# Patient Record
Sex: Female | Born: 1980
Health system: Southern US, Community
[De-identification: ages and names within clinical notes are randomized; demographics above are authoritative.]

## PROBLEM LIST (undated history)

## (undated) DIAGNOSIS — N809 Endometriosis, unspecified: Secondary | ICD-10-CM

## (undated) DIAGNOSIS — F411 Generalized anxiety disorder: Secondary | ICD-10-CM

## (undated) DIAGNOSIS — G8929 Other chronic pain: Secondary | ICD-10-CM

## (undated) DIAGNOSIS — N3946 Mixed incontinence: Secondary | ICD-10-CM

## (undated) DIAGNOSIS — F329 Major depressive disorder, single episode, unspecified: Secondary | ICD-10-CM

## (undated) DIAGNOSIS — K219 Gastro-esophageal reflux disease without esophagitis: Secondary | ICD-10-CM

## (undated) DIAGNOSIS — R7303 Prediabetes: Secondary | ICD-10-CM

## (undated) DIAGNOSIS — I10 Essential (primary) hypertension: Secondary | ICD-10-CM

## (undated) DIAGNOSIS — Z8759 Personal history of other complications of pregnancy, childbirth and the puerperium: Secondary | ICD-10-CM

## (undated) DIAGNOSIS — F419 Anxiety disorder, unspecified: Secondary | ICD-10-CM

## (undated) DIAGNOSIS — B999 Unspecified infectious disease: Secondary | ICD-10-CM

## (undated) DIAGNOSIS — M1990 Unspecified osteoarthritis, unspecified site: Secondary | ICD-10-CM

## (undated) DIAGNOSIS — F1021 Alcohol dependence, in remission: Secondary | ICD-10-CM

## (undated) DIAGNOSIS — R519 Headache, unspecified: Secondary | ICD-10-CM

## (undated) DIAGNOSIS — N819 Female genital prolapse, unspecified: Secondary | ICD-10-CM

## (undated) DIAGNOSIS — Z973 Presence of spectacles and contact lenses: Secondary | ICD-10-CM

## (undated) DIAGNOSIS — F32A Depression, unspecified: Secondary | ICD-10-CM

## (undated) HISTORY — PX: TUBAL LIGATION: SHX77

## (undated) HISTORY — PX: SKIN GRAFT: SHX250

## (undated) HISTORY — DX: Unspecified osteoarthritis, unspecified site: M19.90

## (undated) HISTORY — DX: Anxiety disorder, unspecified: F41.9

## (undated) HISTORY — DX: Essential (primary) hypertension: I10

## (undated) HISTORY — DX: Major depressive disorder, single episode, unspecified: F32.9

## (undated) HISTORY — DX: Depression, unspecified: F32.A

---
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
## 2003-09-28 ENCOUNTER — Ambulatory Visit (HOSPITAL_COMMUNITY): Admission: RE | Admit: 2003-09-28 | Discharge: 2003-09-28 | Payer: Self-pay | Admitting: Family Medicine

## 2003-10-13 ENCOUNTER — Other Ambulatory Visit: Admission: RE | Admit: 2003-10-13 | Discharge: 2003-10-13 | Payer: Self-pay | Admitting: Family Medicine

## 2003-11-01 ENCOUNTER — Ambulatory Visit (HOSPITAL_COMMUNITY): Admission: RE | Admit: 2003-11-01 | Discharge: 2003-11-01 | Payer: Self-pay | Admitting: *Deleted

## 2003-11-17 ENCOUNTER — Ambulatory Visit (HOSPITAL_COMMUNITY): Admission: RE | Admit: 2003-11-17 | Discharge: 2003-11-17 | Payer: Self-pay | Admitting: Family Medicine

## 2004-01-31 ENCOUNTER — Encounter: Admission: RE | Admit: 2004-01-31 | Discharge: 2004-01-31 | Payer: Self-pay | Admitting: Family Medicine

## 2004-03-01 ENCOUNTER — Ambulatory Visit (HOSPITAL_COMMUNITY): Admission: RE | Admit: 2004-03-01 | Discharge: 2004-03-01 | Payer: Self-pay | Admitting: Family Medicine

## 2004-03-01 ENCOUNTER — Ambulatory Visit: Payer: Self-pay | Admitting: Obstetrics & Gynecology

## 2004-03-06 ENCOUNTER — Inpatient Hospital Stay (HOSPITAL_COMMUNITY): Admission: AD | Admit: 2004-03-06 | Discharge: 2004-03-13 | Payer: Self-pay | Admitting: Family Medicine

## 2004-11-13 ENCOUNTER — Other Ambulatory Visit: Admission: RE | Admit: 2004-11-13 | Discharge: 2004-11-13 | Payer: Self-pay | Admitting: Family Medicine

## 2006-06-03 DIAGNOSIS — Z9851 Tubal ligation status: Secondary | ICD-10-CM

## 2006-06-03 HISTORY — DX: Tubal ligation status: Z98.51

## 2006-06-03 HISTORY — PX: LAPAROSCOPIC TUBAL LIGATION: SUR803

## 2009-11-10 ENCOUNTER — Emergency Department (HOSPITAL_COMMUNITY): Admission: EM | Admit: 2009-11-10 | Discharge: 2009-11-10 | Payer: Self-pay | Admitting: Emergency Medicine

## 2010-10-19 NOTE — Discharge Summary (Signed)
[name], [name]              ACCOUNT NO.:  [id number]   MEDICAL RECORD NO.:  [id number]          PATIENT TYPE:  INP   LOCATION:  9101                          FACILITY:  [facility]   PHYSICIAN:  Magnus Sinning. Rice, M.D. DATE OF BIRTH:  1980-07-21   DATE OF ADMISSION:  03/06/2004  DATE OF DISCHARGE:  03/13/2004                                 DISCHARGE SUMMARY   DISCHARGE DIAGNOSES:  1.  Intrauterine pregnancy at 36 and 5, delivered.  2.  Severe preeclampsia.  3.  Vaginal delivery.  4.  Postpartum fever.   DISCHARGE MEDICATIONS:  1.  Motrin 600 mg one p.o. t.i.d. with food.  2.  Labetalol 200 mg one tablet twice a day.  3.  Lasix 20 mg one tablet per day.   DISCHARGE INSTRUCTIONS:  The patient discharged to home with follow-up at  [facility] at 1:45 p.m. in 24 hours.   STUDIES:  Postpartum hemoglobin was 10.3 with platelets of 185.  Blood  culture with no growth after 5 days.  Urine culture showed Enterobacter.   HISTORY AND PHYSICAL:  This is a 30 year old G1 at 71 and 2 by an 11-week  ultrasound who came to the office for a prenatal visit and was found to have  very elevated blood pressures, with blood pressures ranging from 172/104 to  172/111.  She also reported mild headache on the left side of her forehead  with no scotoma, right upper quadrant pain, or ankle swelling.  This  pregnancy had been complicated by gestational diabetes, with fasting blood  sugars in the 80s and 2-hour postprandials less than or equal to 120 in the  week prior to delivery.  Medications:  Prenatal vitamins.  Allergy to  PENICILLIN which causes a rash.  Social history:  No tobacco, alcohol, or  drugs.  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  Prenatal labs were O positive, antibody negative, RPR nonreactive, rubella  immune, HB surface antigen negative, HIV nonreactive, Pap within normal  limits, GC and chlamydia negative, 1-hour Glucola was 150.  She had  an  elevated Down's risk with a normal ultrasound.  Her 3-hour Glucola initial  fasting reading was not done.  The 1-hour was 217, 2-hour 196, and 3-hour  140.  Her 24-hour on September 19 showed 136 mg of protein. She has normal  PIH labs on September 19.  Ultrasound on September 29 showed the baby was at  the 90th to the 95th percentile with an AFI of 15.9 and a grade 1 placenta.  Physical exam was normal except for blood pressures of 159/103 and 170/101.  The abdomen was gravid and the baby was felt to be about 6 pounds by  Leopold's.  Extremities showed 2+ DTRs, no clonus, and no edema.  Fetal  heart tones on admission were in the 140 to 145 range with accelerations  present and occasional mild decelerations.  Tocometry showed contractions  every 5 minutes not felt by the patient.  Laboratory showed a creatinine of  0.7, a hemoglobin of 12.2, platelets of 184, uric acid was 4.1, LDH 132,  SGOT 16, and SGPT 14.  The patient was therefore admitted.  She would be  under observation, get a 24-hour urine.  She was placed on bedrest with  bathroom privileges and her gestational diabetes was in good control but  fasting blood sugars were ordered.  She was placed on a carbohydrate  modified diet, and for her headache she was given Tylenol.   On the day after admission the patient had increased blood pressures that  had started overnight.  She had a unilateral headache in the morning.  She  was therefore started on magnesium sulfate a 4 g bolus with 2 g/hour, and  labetalol was added 50 mg twice a day.  Her diabetes remained in good  control and her headache was improved after her Tylenol.  Later in the day,  the patient was found to have improved blood pressures and decreased  headache, but her blood pressure was 159/91.  Her vaginal exam was found to  be long, firm, fingertip external, and internal closed.  The patient's care  was discussed with Dr. Gavin Potters who agreed that it was appropriate  to begin  ripening her cervix, given the state of her elevated blood pressures.  The  patient was ripened using Cervidil and Cytotec.  She had a magnesium level  while on the magnesium which was found to be 5.7.  She tolerated the  magnesium fairly well.  She did get some Stadol and Phenergan for some  cramping.  The patient finally progressed into labor after successive  methods of induction on March 09, 2004.  She was delivered on March 10, 2004 vaginally with the pediatrician team at delivery, and the baby was  evaluated and sent to the newborn nursery.  Postpartum, the patient was  transferred to [facility] where she was continued on magnesium for 24 hours.  Her  blood pressures drifted down, she had good diuresis, and was close to ready  to be discharged, and had a fever to 102.6 overnight.  Her blood pressures  were also occasionally elevated so it was decided to continue having her in  the hospital 1 further day.  On March 13, 2004 her breasts were found to  be tender and full.  This was believed to be the reason for her fever.  That  improved after use of Motrin.  Her blood pressures were reasonably well  controlled on labetalol.  She was sent home on relative rest and told to  follow up at the office on the day following her discharge.  She was also  given precautions to watch for further fevers.  The additional discharge  medications and instructions are as listed above.     Kath   KMR/MEDQ  D:  03/30/2004  T:  03/30/2004  Job:  [id number]

## 2014-02-14 ENCOUNTER — Emergency Department (HOSPITAL_COMMUNITY)
Admission: EM | Admit: 2014-02-14 | Discharge: 2014-02-14 | Disposition: A | Discharge status: Home / Self Care | Payer: Self-pay | Attending: Emergency Medicine | Admitting: Emergency Medicine

## 2014-02-14 ENCOUNTER — Encounter (HOSPITAL_COMMUNITY): Payer: Self-pay | Admitting: Emergency Medicine

## 2014-02-14 DIAGNOSIS — F17200 Nicotine dependence, unspecified, uncomplicated: Secondary | ICD-10-CM | POA: Insufficient documentation

## 2014-02-14 DIAGNOSIS — M5441 Lumbago with sciatica, right side: Secondary | ICD-10-CM

## 2014-02-14 DIAGNOSIS — M545 Low back pain, unspecified: Secondary | ICD-10-CM | POA: Insufficient documentation

## 2014-02-14 DIAGNOSIS — M5430 Sciatica, unspecified side: Secondary | ICD-10-CM | POA: Insufficient documentation

## 2014-02-14 DIAGNOSIS — Z880 Allergy status to penicillin: Secondary | ICD-10-CM | POA: Insufficient documentation

## 2014-02-14 MED ORDER — CYCLOBENZAPRINE HCL 5 MG PO TABS: 5.0000 mg | ORAL_TABLET | Freq: Three times a day (TID) | ORAL | Status: DC | PRN

## 2014-02-14 MED ORDER — TRAMADOL HCL 50 MG PO TABS
50.0000 mg | ORAL_TABLET | Freq: Four times a day (QID) | ORAL | Status: DC | PRN
Start: 2014-02-14 — End: 2017-05-09

## 2014-02-14 MED ORDER — NAPROXEN 500 MG PO TABS: 500.0000 mg | ORAL_TABLET | Freq: Two times a day (BID) | ORAL | Status: DC

## 2014-02-14 NOTE — ED Notes (Signed)
Low back pain for 1.5 weeks. No injury, Increased pain with movement.  No urinary sx

## 2014-02-14 NOTE — Discharge Instructions (Signed)
Do not take the narcotic or the muscle relaxant if driving because they will make you sleepy.  Back Pain, Adult Back pain is very common. The pain often gets better over time. The cause of back pain is usually not dangerous. Most people can learn to manage their back pain on their own.  HOME CARE   Stay active. Start with short walks on flat ground if you can. Try to walk farther each day.  Do not sit, drive, or stand in one place for more than 30 minutes. Do not stay in bed.  Do not avoid exercise or work. Activity can help your back heal faster.  Be careful when you bend or lift an object. Bend at your knees, keep the object close to you, and do not twist.  Sleep on a firm mattress. Lie on your side, and bend your knees. If you lie on your back, put a pillow under your knees.  Only take medicines as told by your doctor.  Put ice on the injured area.  Put ice in a plastic bag.  Place a towel between your skin and the bag.  Leave the ice on for 15-20 minutes, 03-04 times a day for the first 2 to 3 days. After that, you can switch between ice and heat packs.  Ask your doctor about back exercises or massage.  Avoid feeling anxious or stressed. Find good ways to deal with stress, such as exercise. GET HELP RIGHT AWAY IF:   Your pain does not go away with rest or medicine.  Your pain does not go away in 1 week.  You have new problems.  You do not feel well.  The pain spreads into your legs.  You cannot control when you poop (bowel movement) or pee (urinate).  Your arms or legs feel weak or lose feeling (numbness).  You feel sick to your stomach (nauseous) or throw up (vomit).  You have belly (abdominal) pain.  You feel like you may pass out (faint). MAKE SURE YOU:   Understand these instructions.  Will watch your condition.  Will get help right away if you are not doing well or get worse. Document Released: 11/06/2007 Document Revised: 08/12/2011 Document  Reviewed: 09/21/2013 [facility] Patient Information 2015 [place], [place]. This information is not intended to replace advice given to you by your health care provider. Make sure you discuss any questions you have with your health care provider.

## 2014-02-14 NOTE — ED Provider Notes (Signed)
CSN: [id number]     Arrival date & time 02/14/14  1657 History   First MD Initiated Contact with Patient 02/14/14 1753   This chart was scribed for non-physician practitioner Kerrie Buffalo, NP, working with Hurman Horn, MD by Gwenevere Abbot, ED scribe. This patient was seen in room APFT23/APFT23 and the patient's care was started at 6:21 PM.    Chief Complaint  Patient presents with  . Back Pain    Patient is a 33 y.o. female presenting with back pain. The history is provided by the patient. No language interpreter was used.  Back Pain Location:  Lumbar spine Radiates to: Right hip. Pain severity:  Severe Onset quality:  Gradual Timing:  Constant Chronicity:  New Relieved by:  Nothing Ineffective treatments:  NSAIDs  HPI Comments:  [name] is a 33 y.o. female who presents to the Emergency Department complaining of lower back pain that radiates to the right hip, onset 1 week ago. Pt denies injury, but she is a CNA, and does a great deal of lifting and moving. Pt denies incontinence of urine or bowel. Pt denies fever, chills, numbness or urinary symptoms. Pt reports that she has taken tylenol, but she has continued to go to work. Pt has never experienced this pain before.    History reviewed. No pertinent past medical history. Past Surgical History  Procedure Laterality Date  . Tubal ligation     History reviewed. No pertinent family history. History  Substance Use Topics  . Smoking status: Current Every Day Smoker  . Smokeless tobacco: Not on file  . Alcohol Use: No   OB History   Grav Para Term Preterm Abortions TAB SAB Ect Mult Living                 Review of Systems  Musculoskeletal: Positive for back pain.  All other systems reviewed and are negative.     Allergies  Penicillins and Sulfa antibiotics  Home Medications   Prior to Admission medications   Not on File   BP 144/93  Pulse 100  Temp(Src) 98.6 F (37 C) (Oral)  Resp 18  Ht  (1.6 m)   Wt 178 lb (80.74 kg)  BMI 31.54 kg/m2  SpO2 100%  LMP 02/08/2014 Physical Exam  Nursing note and vitals reviewed. Constitutional: She is oriented to person, place, and time. She appears well-developed and well-nourished. No distress.  HENT:  Head: Normocephalic and atraumatic.  Right Ear: Tympanic membrane normal.  Left Ear: Tympanic membrane normal.  Nose: Nose normal.  Mouth/Throat: Uvula is midline, oropharynx is clear and moist and mucous membranes are normal.  Eyes: EOM are normal.  Neck: Normal range of motion. Neck supple.  Cardiovascular: Normal rate and regular rhythm.   Pulmonary/Chest: Effort normal and breath sounds normal. She has no decreased breath sounds. She has no wheezes. She has no rhonchi. She has no rales.  Abdominal: Soft. Bowel sounds are normal. There is no tenderness.  Musculoskeletal: Normal range of motion.       Lumbar back: She exhibits tenderness, pain and spasm. She exhibits normal pulse.  Tenderness over the right lumbar area that radiates to the right sciatic nerve. Ambulatory without foot drag.   Neurological: She is alert and oriented to person, place, and time. She has normal strength. No cranial nerve deficit or sensory deficit. Gait normal.  Reflex Scores:      Bicep reflexes are 2+ on the right side and 2+ on the left side.  Brachioradialis reflexes are 2+ on the right side and 2+ on the left side.      Patellar reflexes are 2+ on the right side and 2+ on the left side.      Achilles reflexes are 2+ on the right side and 2+ on the left side. Skin: Skin is warm and dry.  Psychiatric: She has a normal mood and affect. Her behavior is normal.    ED Course  Procedures  DIAGNOSTIC STUDIES: Oxygen Saturation is 100% on RA, normal by my interpretation.  COORDINATION OF CARE: 6:27 PM-Discussed treatment plan with pt at bedside and pt agreed to plan.   MDM  33 y.o. female with low back pain for the past week and a half. Stable for discharge  without neuro deficits. Will treat for pain and muscle spasm. Patient to follow up with ortho if symptoms persist. Discussed with the patient clinical findings and plan of care. All questioned fully answered.    Medication List         cyclobenzaprine 5 MG tablet  Commonly known as:  FLEXERIL  Take 1 tablet (5 mg total) by mouth 3 (three) times daily as needed for muscle spasms.     naproxen 500 MG tablet  Commonly known as:  NAPROSYN  Take 1 tablet (500 mg total) by mouth 2 (two) times daily.     traMADol 50 MG tablet  Commonly known as:  ULTRAM  Take 1 tablet (50 mg total) by mouth every 6 (six) hours as needed.       I personally performed the services described in this documentation, which was scribed in my presence. The recorded information has been reviewed and is accurate.   [facility] Orlene Och, [place] 02/15/14 [id number]

## 2014-02-16 NOTE — ED Provider Notes (Signed)
Medical screening examination/treatment/procedure(s) were performed by non-physician practitioner and as supervising physician I was immediately available for consultation/collaboration.   EKG Interpretation None       Andrick Rust M Alexandar Weisenberger, MD 02/16/14 2245 

## 2016-06-03 DIAGNOSIS — Z87898 Personal history of other specified conditions: Secondary | ICD-10-CM

## 2016-06-03 HISTORY — DX: Personal history of other specified conditions: Z87.898

## 2017-01-12 DIAGNOSIS — F17210 Nicotine dependence, cigarettes, uncomplicated: Secondary | ICD-10-CM | POA: Diagnosis not present

## 2017-01-12 DIAGNOSIS — K047 Periapical abscess without sinus: Secondary | ICD-10-CM | POA: Diagnosis not present

## 2017-01-12 DIAGNOSIS — H9201 Otalgia, right ear: Secondary | ICD-10-CM | POA: Diagnosis not present

## 2017-01-12 DIAGNOSIS — I10 Essential (primary) hypertension: Secondary | ICD-10-CM | POA: Diagnosis not present

## 2017-01-12 DIAGNOSIS — K0381 Cracked tooth: Secondary | ICD-10-CM | POA: Diagnosis not present

## 2017-01-12 DIAGNOSIS — R220 Localized swelling, mass and lump, head: Secondary | ICD-10-CM | POA: Diagnosis not present

## 2017-01-12 DIAGNOSIS — Z9114 Patient's other noncompliance with medication regimen: Secondary | ICD-10-CM | POA: Diagnosis not present

## 2017-05-07 NOTE — Progress Notes (Signed)
Subjective: ZO:XWRUEAVWUCC:establish care, new dx HTN; possible UTI HPI: Revonda StandardMarsha M [place] is a 36 y.o. female presenting to clinic today for:  1. Hypertension Patient reports she was recently seen at the [facility] emergency department in [place] after she lost consciousness at work.  She had an MRI, CT of the brain which was unremarkable per her report.  She had EKG and blood labs again which were unremarkable.  She notes that she was told that she had elevated blood pressure and was prescribed nadolol.  She notes that she has been compliant with this medication but it has been causing her to be dizzy, fatigued and poor feeling.  Prior to establishing care today, she has not seen a provider in several years.  Currently, she denies headache, dizziness, visual changes, nausea, vomiting, LE swelling, abdominal pain or shortness of breath.  She reports chronic chest pain that is left-sided.  She notes that this has been present for greater than 6 months.  It is constant and not exacerbated by any activities.  She denies associated shortness of breath, nausea, vomiting.  Her family history is significant for early cardiovascular disease in her father, who had his first heart attack in his 30s.  She notes that her twin brother had his first heart attack at age 34 and has had 2 subsequent MIs since that time.  Father's medical history is also significant for cardio vascular accident.  Her mother's history significant for CVA x2.  Hypertension is present and her twin brother, younger brother and younger sister.  She has never seen a cardiologist for this.  2. Abnormal menstrual cycle Patient reports that she has irregular menstrual cycles.  She notes last menstrual cycle was April 20, 2017.  She describes this as heavy with clots appreciated.  She has a BTL.  Not on any medications.  Patient has 2 children.  No known history of thyroid disorder personally or within the family.  Past Medical History:  Diagnosis Date  .  Anxiety   . Arthritis   . Depression   . Hypertension    Past Surgical History:  Procedure Laterality Date  . TUBAL LIGATION     Social History   Socioeconomic History  . Marital status: Divorced    Spouse name: Not on file  . Number of children: Not on file  . Years of education: Not on file  . Highest education level: Not on file  Social Needs  . Financial resource strain: Not on file  . Food insecurity - worry: Not on file  . Food insecurity - inability: Not on file  . Transportation needs - medical: Not on file  . Transportation needs - non-medical: Not on file  Occupational History  . Occupation: CMA  Tobacco Use  . Smoking status: Current Every Day Smoker    Packs/day: 1.00    Years: 5.00    Pack years: 5.00    Types: Cigarettes  . Smokeless tobacco: Never Used  Substance and Sexual Activity  . Alcohol use: No  . Drug use: No  . Sexual activity: Yes    Birth control/protection: Surgical  Other Topics Concern  . Not on file  Social History Narrative  . Not on file   No outpatient medications have been marked as taking for the 05/09/17 encounter (Office Visit) with Raliegh IpGottschalk, Ettore Trebilcock M, DO.   Family History  Problem Relation Age of Onset  . Depression Mother   . Stroke Mother   . Diabetes Father   .  Hypertension Father   . Stroke Father   . Heart attack Father 30  . Hypertension Sister   . Hypertension Brother   . Heart attack Brother 27       x3 MI (twin bro)  . CAD Brother   . Asthma Daughter   . Breast cancer Paternal Grandmother [id number]  . Hypertension Brother   . Healthy Daughter    Allergies  Allergen Reactions  . Penicillins   . Sulfa Antibiotics      Health Maintenance: pap smear  ROS: Per HPI  Objective: Office vital signs reviewed. BP (!) 146/97   Pulse 89   Temp (!) 97.5 F (36.4 C) (Oral)   Ht 5\' 3"  (1.6 m)   Wt 191 lb (86.6 kg)   BMI 33.83 kg/m   Physical Examination:  General: Awake, alert, well nourished, well appearing  female, No acute distress HEENT: Normal    Neck: No masses palpated. No lymphadenopathy, thyroid not palpable. NO JVD.  No carotid bruits    Eyes: PERRLA, extraocular movement in tact, sclera white    Throat: moist mucus membranes, no erythema Cardio: regular rate and rhythm, S1S2 heard, no murmurs appreciated Pulm: clear to auscultation bilaterally, no wheezes, rhonchi or rales; normal work of breathing on room air Ext: Warm, well-perfused, no edema, +2 distal pulses Neuro: Follows all commands, no focal neurologic deficits Psych: Mood stable, speech normal, affect appropriate, good eye contact, does not appear to be responding to internal stimuli  Depression screen PHQ 2/9 05/09/2017  Decreased Interest 0  Down, Depressed, Hopeless 0  PHQ - 2 Score 0    Assessment/ Plan: 36 y.o. female   Essential hypertension Not controlled on nadolol.  Patient to discontinue nadolol, start hydrochlorothiazide 12.5 mg daily.  She will increase to 25 mg if she has blood pressures greater than 140/90.  She will follow-up in 2 weeks for blood pressure recheck.  If persistently outside of goal, we will plan to add additional medication.  Obtain CMP, lipid, TSH, CBC today.  Given her strong family history of early cardiovascular disease, and the reports of atypical chest pain (reports normal EKG/ workup recently in ED), referral has been placed to cardiology for further evaluation.  Chest pain may be a manifestation of anxiety but I suspect that she likely at minimum warrants stress testing. Strict return precautions and reasons for emergent evaluation in the emergency department review with patient.  She voiced understanding and will follow-up as needed.   Anxiety state We will hold off on any medications at this time.  We discussed that it is important to ensure that she is cardiovascularly sound before proceeding with any medications like SSRIs.  Anxiety is well controlled at this time with meditation.  No  medications added today.  Abnormal menstrual periods Obtain CMP, TSH, CBC.  If unremarkable, we discussed possible consideration for pelvic ultrasound to rule out fibroids.  If this is unremarkable, could consider initiation of OCPs versus IUD placement.  Atypical chest pain May be secondary to anxiety symptoms.  However given her strong family history of early cardiovascular disease, she has been referred to cardiology for further evaluation.  Release of information completed to obtain recent EKG, labs, MRI and CT head.  Encounter to establish care with new doctor Release of information form completed today.  Will obtain records from [facility] in [place].  Patient to schedule follow-up for 2-week recheck of blood pressure/physical exam with Pap smear.  Need to address tobacco use.   Lynell Greenhouse  Windell Moulding, [place] [phone]

## 2017-05-09 ENCOUNTER — Ambulatory Visit: Payer: BLUE CROSS/BLUE SHIELD | Admitting: Family Medicine

## 2017-05-09 ENCOUNTER — Encounter: Payer: Self-pay | Admitting: Family Medicine

## 2017-05-09 VITALS — BP 146/97 | HR 89 | Temp 97.5°F | Ht 63.0 in | Wt 191.0 lb

## 2017-05-09 DIAGNOSIS — Z8249 Family history of ischemic heart disease and other diseases of the circulatory system: Secondary | ICD-10-CM

## 2017-05-09 DIAGNOSIS — N926 Irregular menstruation, unspecified: Secondary | ICD-10-CM

## 2017-05-09 DIAGNOSIS — Z7689 Persons encountering health services in other specified circumstances: Secondary | ICD-10-CM | POA: Diagnosis not present

## 2017-05-09 DIAGNOSIS — F17200 Nicotine dependence, unspecified, uncomplicated: Secondary | ICD-10-CM

## 2017-05-09 DIAGNOSIS — I10 Essential (primary) hypertension: Secondary | ICD-10-CM | POA: Diagnosis not present

## 2017-05-09 DIAGNOSIS — F411 Generalized anxiety disorder: Secondary | ICD-10-CM | POA: Insufficient documentation

## 2017-05-09 DIAGNOSIS — R0789 Other chest pain: Secondary | ICD-10-CM | POA: Diagnosis not present

## 2017-05-09 MED ORDER — HYDROCHLOROTHIAZIDE 25 MG PO TABS: 12.5000 mg | ORAL_TABLET | Freq: Every day | ORAL | 0 refills | Status: DC

## 2017-05-09 NOTE — Assessment & Plan Note (Signed)
We will hold off on any medications at this time.  We discussed that it is important to ensure that she is cardiovascularly sound before proceeding with any medications like SSRIs.  Anxiety is well controlled at this time with meditation.  No medications added today.

## 2017-05-09 NOTE — Assessment & Plan Note (Signed)
Not controlled on nadolol.  Patient to discontinue nadolol, start hydrochlorothiazide 12.5 mg daily.  She will increase to 25 mg if she has blood pressures greater than 140/90.  She will follow-up in 2 weeks for blood pressure recheck.  If persistently outside of goal, we will plan to add additional medication.  Obtain CMP, lipid, TSH, CBC today.  Given her strong family history of early cardiovascular disease, and the reports of atypical chest pain (reports normal EKG/ workup recently in ED), referral has been placed to cardiology for further evaluation.  Chest pain may be a manifestation of anxiety but I suspect that she likely at minimum warrants stress testing. Strict return precautions and reasons for emergent evaluation in the emergency department review with patient.  She voiced understanding and will follow-up as needed.

## 2017-05-09 NOTE — Assessment & Plan Note (Signed)
May be secondary to anxiety symptoms.  However given her strong family history of early cardiovascular disease, she has been referred to cardiology for further evaluation.  Release of information completed to obtain recent EKG, labs, MRI and CT head.

## 2017-05-09 NOTE — Patient Instructions (Signed)
I would like you to follow-up in 2 weeks for repeat blood pressures and your annual physical exam.  We will plan to obtain the Pap smear at that time.  I have referred you to cardiology.  If you do not hear from anyone to schedule an appointment in the next week, please call me.  If you develop any of the worsening symptoms or signs of heart attack, please seek immediate medical attention in the emergency department.  You had labs performed today.  You will be contacted with the results of the labs once they are available, usually in the next 3 days for routine lab work.   Hypertension Hypertension, commonly called high blood pressure, is when the force of blood pumping through the arteries is too strong. The arteries are the blood vessels that carry blood from the heart throughout the body. Hypertension forces the heart to work harder to pump blood and may cause arteries to become narrow or stiff. Having untreated or uncontrolled hypertension can cause heart attacks, strokes, kidney disease, and other problems. A blood pressure reading consists of a higher number over a lower number. Ideally, your blood pressure should be below 120/80. The first ("top") number is called the systolic pressure. It is a measure of the pressure in your arteries as your heart beats. The second ("bottom") number is called the diastolic pressure. It is a measure of the pressure in your arteries as the heart relaxes. What are the causes? The cause of this condition is not known. What increases the risk? Some risk factors for high blood pressure are under your control. Others are not. Factors you can change  Smoking.  Having type 2 diabetes mellitus, high cholesterol, or both.  Not getting enough exercise or physical activity.  Being overweight.  Having too much fat, sugar, calories, or salt (sodium) in your diet.  Drinking too much alcohol. Factors that are difficult or impossible to change  Having chronic  kidney disease.  Having a family history of high blood pressure.  Age. Risk increases with age.  Race. You may be at higher risk if you are African-American.  Gender. Men are at higher risk than women before age 34. After age 34, women are at higher risk than men.  Having obstructive sleep apnea.  Stress. What are the signs or symptoms? Extremely high blood pressure (hypertensive crisis) may cause:  Headache.  Anxiety.  Shortness of breath.  Nosebleed.  Nausea and vomiting.  Severe chest pain.  Jerky movements you cannot control (seizures).  How is this diagnosed? This condition is diagnosed by measuring your blood pressure while you are seated, with your arm resting on a surface. The cuff of the blood pressure monitor will be placed directly against the skin of your upper arm at the level of your heart. It should be measured at least twice using the same arm. Certain conditions can cause a difference in blood pressure between your right and left arms. Certain factors can cause blood pressure readings to be lower or higher than normal (elevated) for a short period of time:  When your blood pressure is higher when you are in a health care provider's office than when you are at home, this is called white coat hypertension. Most people with this condition do not need medicines.  When your blood pressure is higher at home than when you are in a health care provider's office, this is called masked hypertension. Most people with this condition may need medicines to control blood  pressure.  If you have a high blood pressure reading during one visit or you have normal blood pressure with other risk factors:  You may be asked to return on a different day to have your blood pressure checked again.  You may be asked to monitor your blood pressure at home for 1 week or longer.  If you are diagnosed with hypertension, you may have other blood or imaging tests to help your health care  provider understand your overall risk for other conditions. How is this treated? This condition is treated by making healthy lifestyle changes, such as eating healthy foods, exercising more, and reducing your alcohol intake. Your health care provider may prescribe medicine if lifestyle changes are not enough to get your blood pressure under control, and if:  Your systolic blood pressure is above 130.  Your diastolic blood pressure is above 80.  Your personal target blood pressure may vary depending on your medical conditions, your age, and other factors. Follow these instructions at home: Eating and drinking  Eat a diet that is high in fiber and potassium, and low in sodium, added sugar, and fat. An example eating plan is called the DASH (Dietary Approaches to Stop Hypertension) diet. To eat this way: ? Eat plenty of fresh fruits and vegetables. Try to fill half of your plate at each meal with fruits and vegetables. ? Eat whole grains, such as whole wheat pasta, brown rice, or whole grain bread. Fill about one quarter of your plate with whole grains. ? Eat or drink low-fat dairy products, such as skim milk or low-fat yogurt. ? Avoid fatty cuts of meat, processed or cured meats, and poultry with skin. Fill about one quarter of your plate with lean proteins, such as fish, chicken without skin, beans, eggs, and tofu. ? Avoid premade and processed foods. These tend to be higher in sodium, added sugar, and fat.  Reduce your daily sodium intake. Most people with hypertension should eat less than 1,500 mg of sodium a day.  Limit alcohol intake to no more than 1 drink a day for nonpregnant women and 2 drinks a day for men. One drink equals 12 oz of beer, 5 oz of wine, or 1 oz of hard liquor. Lifestyle  Work with your health care provider to maintain a healthy body weight or to lose weight. Ask what an ideal weight is for you.  Get at least 30 minutes of exercise that causes your heart to beat  faster (aerobic exercise) most days of the week. Activities may include walking, swimming, or biking.  Include exercise to strengthen your muscles (resistance exercise), such as pilates or lifting weights, as part of your weekly exercise routine. Try to do these types of exercises for 30 minutes at least 3 days a week.  Do not use any products that contain nicotine or tobacco, such as cigarettes and e-cigarettes. If you need help quitting, ask your health care provider.  Monitor your blood pressure at home as told by your health care provider.  Keep all follow-up visits as told by your health care provider. This is important. Medicines  Take over-the-counter and prescription medicines only as told by your health care provider. Follow directions carefully. Blood pressure medicines must be taken as prescribed.  Do not skip doses of blood pressure medicine. Doing this puts you at risk for problems and can make the medicine less effective.  Ask your health care provider about side effects or reactions to medicines that you should watch  for. Contact a health care provider if:  You think you are having a reaction to a medicine you are taking.  You have headaches that keep coming back (recurring).  You feel dizzy.  You have swelling in your ankles.  You have trouble with your vision. Get help right away if:  You develop a severe headache or confusion.  You have unusual weakness or numbness.  You feel faint.  You have severe pain in your chest or abdomen.  You vomit repeatedly.  You have trouble breathing. Summary  Hypertension is when the force of blood pumping through your arteries is too strong. If this condition is not controlled, it may put you at risk for serious complications.  Your personal target blood pressure may vary depending on your medical conditions, your age, and other factors. For most people, a normal blood pressure is less than 120/80.  Hypertension is  treated with lifestyle changes, medicines, or a combination of both. Lifestyle changes include weight loss, eating a healthy, low-sodium diet, exercising more, and limiting alcohol. This information is not intended to replace advice given to you by your health care provider. Make sure you discuss any questions you have with your health care provider. Document Released: 05/20/2005 Document Revised: 04/17/2016 Document Reviewed: 04/17/2016 Elsevier Interactive Patient Education  2018 [employer]  Nonspecific Chest Pain Chest pain can be caused by many different conditions. There is a chance that your pain could be related to something serious, such as a heart attack or a blood clot in your lungs. Chest pain can also be caused by conditions that are not life-threatening. If you have chest pain, it is very important to follow up with your doctor. Follow these instructions at home: Medicines  If you were prescribed an antibiotic medicine, take it as told by your doctor. Do not stop taking the antibiotic even if you start to feel better.  Take over-the-counter and prescription medicines only as told by your doctor. Lifestyle  Do not use any products that contain nicotine or tobacco, such as cigarettes and e-cigarettes. If you need help quitting, ask your doctor.  Do not drink alcohol.  Make lifestyle changes as told by your doctor. These may include: ? Getting regular exercise. Ask your doctor for some activities that are safe for you. ? Eating a heart-healthy diet. A diet specialist (dietitian) can help you to learn healthy eating options. ? Staying at a healthy weight. ? Managing diabetes, if needed. ? Lowering your stress, as with deep breathing or spending time in nature. General instructions  Avoid any activities that make you feel chest pain.  If your chest pain is because of heartburn: ? Raise (elevate) the head of your bed about 6 inches (15 cm). You can do this by putting blocks  under the bed legs at the head of the bed. ? Do not sleep with extra pillows under your head. That does not help heartburn.  Keep all follow-up visits as told by your doctor. This is important. This includes any further testing if your chest pain does not go away. Contact a doctor if:  Your chest pain does not go away.  You have a rash with blisters on your chest.  You have a fever.  You have chills. Get help right away if:  Your chest pain is worse.  You have a cough that gets worse, or you cough up blood.  You have very bad (severe) pain in your belly (abdomen).  You are very weak.  You pass out (faint).  You have either of these for no clear reason: ? Sudden chest discomfort. ? Sudden discomfort in your arms, back, neck, or jaw.  You have shortness of breath at any time.  You suddenly start to sweat, or your skin gets clammy.  You feel sick to your stomach (nauseous).  You throw up (vomit).  You suddenly feel light-headed or dizzy.  Your heart starts to beat fast, or it feels like it is skipping beats. These symptoms may be an emergency. Do not wait to see if the symptoms will go away. Get medical help right away. Call your local emergency services (911 in the [place] Do not drive yourself to the hospital. This information is not intended to replace advice given to you by your health care provider. Make sure you discuss any questions you have with your health care provider. Document Released: 11/06/2007 Document Revised: 02/12/2016 Document Reviewed: 02/12/2016 Elsevier Interactive Patient Education  2017 [employer].

## 2017-05-09 NOTE — Assessment & Plan Note (Signed)
Obtain CMP, TSH, CBC.  If unremarkable, we discussed possible consideration for pelvic ultrasound to rule out fibroids.  If this is unremarkable, could consider initiation of OCPs versus IUD placement.

## 2017-05-10 LAB — CMP14+EGFR
ALT: 12 IU/L (ref 0–32)
AST: 13 IU/L (ref 0–40)
Albumin/Globulin Ratio: 2 (ref 1.2–2.2)
Albumin: 4.6 g/dL (ref 3.5–5.5)
Alkaline Phosphatase: 72 IU/L (ref 39–117)
BUN/Creatinine Ratio: 13 (ref 9–23)
BUN: 11 mg/dL (ref 6–20)
Bilirubin Total: 0.4 mg/dL (ref 0.0–1.2)
CO2: 22 mmol/L (ref 20–29)
Calcium: 9.6 mg/dL (ref 8.7–10.2)
Chloride: 101 mmol/L (ref 96–106)
Creatinine, Ser: 0.86 mg/dL (ref 0.57–1.00)
GFR calc Af Amer: 103 mL/min/{1.73_m2} (ref >59)
GFR calc non Af Amer: 89 mL/min/{1.73_m2} (ref >59)
Globulin, Total: 2.3 g/dL (ref 1.5–4.5)
Glucose: 90 mg/dL (ref 65–99)
Potassium: 4.2 mmol/L (ref 3.5–5.2)
Sodium: 139 mmol/L (ref 134–144)
Total Protein: 6.9 g/dL (ref 6.0–8.5)

## 2017-05-10 LAB — CBC WITH DIFFERENTIAL/PLATELET
Basophils Absolute: 0 10*3/uL (ref 0.0–0.2)
Basos: 0 %
EOS (ABSOLUTE): 0.6 10*3/uL — ABNORMAL HIGH (ref 0.0–0.4)
Eos: 4 %
Hematocrit: 44.6 % (ref 34.0–46.6)
Hemoglobin: 15 g/dL (ref 11.1–15.9)
Immature Grans (Abs): 0 10*3/uL (ref 0.0–0.1)
Immature Granulocytes: 0 %
Lymphocytes Absolute: 2.7 10*3/uL (ref 0.7–3.1)
Lymphs: 20 %
MCH: 30.5 pg (ref 26.6–33.0)
MCHC: 33.6 g/dL (ref 31.5–35.7)
MCV: 91 fL (ref 79–97)
Monocytes Absolute: 0.6 10*3/uL (ref 0.1–0.9)
Monocytes: 5 %
Neutrophils Absolute: 9.6 10*3/uL — ABNORMAL HIGH (ref 1.4–7.0)
Neutrophils: 71 %
Platelets: 247 10*3/uL (ref 150–379)
RBC: 4.92 x10E6/uL (ref 3.77–5.28)
RDW: 13 % (ref 12.3–15.4)
WBC: 13.6 10*3/uL — ABNORMAL HIGH (ref 3.4–10.8)

## 2017-05-10 LAB — TSH: TSH: 1.03 u[IU]/mL (ref 0.450–4.500)

## 2017-05-10 LAB — LIPID PANEL
Chol/HDL Ratio: 4.8 ratio — ABNORMAL HIGH (ref 0.0–4.4)
Cholesterol, Total: 183 mg/dL (ref 100–199)
HDL: 38 mg/dL — ABNORMAL LOW (ref >39)
LDL Calculated: 128 mg/dL — ABNORMAL HIGH (ref 0–99)
Triglycerides: 84 mg/dL (ref 0–149)
VLDL Cholesterol Cal: 17 mg/dL (ref 5–40)

## 2017-05-14 ENCOUNTER — Other Ambulatory Visit: Payer: Self-pay | Admitting: Family Medicine

## 2017-05-14 ENCOUNTER — Telehealth: Payer: Self-pay | Admitting: Family Medicine

## 2017-05-14 MED ORDER — ATORVASTATIN CALCIUM 40 MG PO TABS
40.0000 mg | ORAL_TABLET | Freq: Every day | ORAL | 0 refills | Status: DC
Start: 2017-05-14 — End: 2018-10-28

## 2017-05-14 NOTE — Progress Notes (Signed)
Recent lipid panel with elevated LDL and low HDL.  Family history significant for early cardiovascular disease.  She has been referred and has an appointment with cardiology scheduled for 05/28/2017.  LFTs within normal limits, she has BTL for contraception.  Given her significant family history and abnormal lipid panel, Lipitor 40 mg daily was prescribed.  Meds ordered this encounter  Medications  . atorvastatin (LIPITOR) 40 MG tablet    Sig: Take 1 tablet (40 mg total) by mouth daily.    Dispense:  90 tablet    Refill:  0

## 2017-05-14 NOTE — Telephone Encounter (Signed)
Patient aware of lab results and verbalizes understanding.  

## 2017-05-21 ENCOUNTER — Telehealth: Payer: Self-pay | Admitting: Family Medicine

## 2017-05-21 ENCOUNTER — Encounter: Payer: Self-pay | Admitting: Family Medicine

## 2017-05-21 ENCOUNTER — Other Ambulatory Visit: Payer: Self-pay | Admitting: Family Medicine

## 2017-05-21 MED ORDER — AMLODIPINE BESYLATE 5 MG PO TABS: 5.0000 mg | ORAL_TABLET | Freq: Every day | ORAL | 0 refills | Status: DC

## 2017-05-21 NOTE — Telephone Encounter (Signed)
Pt aware to stop hydrochlorathiazide New medication [facility]

## 2017-05-21 NOTE — Telephone Encounter (Signed)
She may discontinue if not tolerating.  Norvasc 5mg  daily sent to pharmacy.

## 2017-05-26 ENCOUNTER — Encounter: Payer: Self-pay | Admitting: *Deleted

## 2017-05-28 ENCOUNTER — Encounter: Payer: Self-pay | Admitting: *Deleted

## 2017-05-28 ENCOUNTER — Encounter: Payer: Self-pay | Admitting: Cardiovascular Disease

## 2017-05-28 ENCOUNTER — Encounter: Payer: BLUE CROSS/BLUE SHIELD | Admitting: Family Medicine

## 2017-05-28 ENCOUNTER — Telehealth: Payer: Self-pay | Admitting: Cardiovascular Disease

## 2017-05-28 ENCOUNTER — Ambulatory Visit (INDEPENDENT_AMBULATORY_CARE_PROVIDER_SITE_OTHER): Payer: Self-pay | Admitting: Cardiovascular Disease

## 2017-05-28 ENCOUNTER — Ambulatory Visit: Payer: BLUE CROSS/BLUE SHIELD | Admitting: Family Medicine

## 2017-05-28 VITALS — BP 130/89 | HR 88 | Ht 63.0 in | Wt 196.8 lb

## 2017-05-28 DIAGNOSIS — Z720 Tobacco use: Secondary | ICD-10-CM

## 2017-05-28 DIAGNOSIS — I10 Essential (primary) hypertension: Secondary | ICD-10-CM

## 2017-05-28 DIAGNOSIS — R079 Chest pain, unspecified: Secondary | ICD-10-CM

## 2017-05-28 DIAGNOSIS — R55 Syncope and collapse: Secondary | ICD-10-CM

## 2017-05-28 NOTE — Progress Notes (Signed)
CARDIOLOGY CONSULT NOTE  Patient ID: Caitlin StandardMarsha M Benn MRN: [id number] DOB/AGE: 1981-04-13 36 y.o.  Admit date: (Not on file) Primary Physician: Raliegh IpGottschalk, Ashly M, DO Referring Physician: Raliegh IpGottschalk, Ashly M, DO  Reason for Consultation: Chest pain  HPI: [name] is a 36 y.o. female who is being seen today for the evaluation of chest pain at the request of Raliegh IpGottschalk, Ashly M, DO.   I reviewed the electronic medical record with regards to PCP documentation as well as labs and studies.  She was reportedly evaluated for chest pain and an emergency room.  She has a history of hypertension and anxiety.  She reportedly has a family history of premature coronary artery disease.  Labs 05/09/17: Elevated white blood cell count of 13.6, hemoglobin 15, platelets 247, total cholesterol 183, triglycerides 84, HDL 38, LDL 128, TSH 1, BUN 11, creatinine 0.86.  ECG performed today which I personally interpreted demonstrated sinus rhythm with nonspecific T wave abnormalities.  She said she has been experiencing chest pain for approximately 1 year.  She was evaluated at an emergency room in April [place] in [place].  She said she was at work and passed out.  She said blood pressures were in the 190s/164 range.  She said she underwent an EKG, MRI, CT scan, and blood tests.  I do not have a copy of these results and plan to request them.  She was prescribed nadolol but developed fatigue.  He was then started by her PCP on hydrochlorothiazide but she developed headaches.  She is now tolerating amlodipine 5 mg.  She has increased energy and decreased chest pain and shortness of breath.  She denies leg swelling, palpitations, and paroxysmal nocturnal dyspnea.  Pains have been in the precordial region which she describes as sharp.  There is no radiation to the jaw, back, or neck.  Family history: Father has had 2 MIs and CABG.  His first MI was in his 640s.  She has a twin brother who had 3 heart attacks  in 1 day.  He is a non-smoker.  He drinks 2-3 energy drinks daily.  Social history: She has smoked 1-1/2 packs of cigarettes daily for the past 15 years.   Allergies  Allergen Reactions  . Penicillins   . Sulfa Antibiotics     Current Outpatient Medications  Medication Sig Dispense Refill  . amLODipine (NORVASC) 5 MG tablet Take 1 tablet (5 mg total) by mouth daily. 30 tablet 0  . atorvastatin (LIPITOR) 40 MG tablet Take 1 tablet (40 mg total) by mouth daily. 90 tablet 0   No current facility-administered medications for this visit.     Past Medical History:  Diagnosis Date  . Anxiety   . Arthritis   . Depression   . Hypertension     Past Surgical History:  Procedure Laterality Date  . TUBAL LIGATION      Social History   Socioeconomic History  . Marital status: Divorced    Spouse name: Not on file  . Number of children: Not on file  . Years of education: Not on file  . Highest education level: Not on file  Social Needs  . Financial resource strain: Not on file  . Food insecurity - worry: Not on file  . Food insecurity - inability: Not on file  . Transportation needs - medical: Not on file  . Transportation needs - non-medical: Not on file  Occupational History  . Occupation: CMA  Tobacco Use  .  Smoking status: Current Every Day Smoker    Packs/day: 1.00    Years: 5.00    Pack years: 5.00    Types: Cigarettes    Start date: 05/29/2006  . Smokeless tobacco: Never Used  Substance and Sexual Activity  . Alcohol use: No  . Drug use: No  . Sexual activity: Yes    Birth control/protection: Surgical    Comment: BTL  Other Topics Concern  . Not on file  Social History Narrative  . Not on file      Current Meds  Medication Sig  . amLODipine (NORVASC) 5 MG tablet Take 1 tablet (5 mg total) by mouth daily.  Marland Kitchen. atorvastatin (LIPITOR) 40 MG tablet Take 1 tablet (40 mg total) by mouth daily.      Review of systems complete and found to be negative unless  listed above in HPI    Physical exam Blood pressure 130/89, pulse 88, height 5\' 3"  (1.6 m), weight 196 lb 12.8 oz (89.3 kg), SpO2 99 %. General: NAD Neck: No JVD, no thyromegaly or thyroid nodule.  Lungs: Clear to auscultation bilaterally with normal respiratory effort. CV: Nondisplaced PMI. Regular rate and rhythm, normal S1/S2, no S3/S4, no murmur.  No peripheral edema.  No carotid bruit.    Abdomen: Soft, nontender, no distention.  Skin: Intact without lesions or rashes.  Neurologic: Alert and oriented x 3.  Psych: Normal affect. Extremities: No clubbing or cyanosis.  HEENT: Normal.   ECG: Most recent ECG reviewed.   Labs: Lab Results  Component Value Date/Time   K 4.2 05/09/2017 02:42 PM   BUN 11 05/09/2017 02:42 PM   CREATININE 0.86 05/09/2017 02:42 PM   ALT 12 05/09/2017 02:42 PM   TSH 1.030 05/09/2017 02:42 PM   HGB 15.0 05/09/2017 02:42 PM     Lipids: Lab Results  Component Value Date/Time   LDLCALC 128 (H) 05/09/2017 02:42 PM   CHOL 183 05/09/2017 02:42 PM   TRIG 84 05/09/2017 02:42 PM   HDL 38 (L) 05/09/2017 02:42 PM        ASSESSMENT AND PLAN:  1.  Chest pain: Atypical chest pains which are decreasing in intensity with blood pressure control on amlodipine 5 mg.  Cardiovascular risk factors include family history of premature coronary artery disease, hypertension, and tobacco use.  I will obtain an echocardiogram to evaluate cardiac structure and function.  I will obtain an exercise treadmill stress test to evaluate for ischemia.  2.  Hypertension: Diastolic blood pressure is elevated.  She needs risk factor modification in the form of exercise, weight loss, and tobacco cessation.  She is on amlodipine 5 mg.  No changes today.  3.  Syncope: This was deemed secondary to hypertensive emergency.  I will try to obtain records from her ED visit in April 2018.  She has had no recurrences.  4.  Tobacco abuse disorder.     Disposition: Follow up in 6  weeks.   Signed: Prentice DockerSuresh Koneswaran, M.D., F.A.C.C.  05/28/2017, 8:52 AM

## 2017-05-28 NOTE — Patient Instructions (Signed)
Medication Instructions:  Continue all current medications.  Labwork: none  Testing/Procedures:  Your physician has requested that you have an echocardiogram. Echocardiography is a painless test that uses sound waves to create images of your heart. It provides your doctor with information about the size and shape of your heart and how well your heart's chambers and valves are working. This procedure takes approximately one hour. There are no restrictions for this procedure.  Your physician has requested that you have an exercise tolerance test. For further information please visit [email or web address]. Please also follow instruction sheet, as given.  Office will contact with results via phone or letter.    Follow-Up: 6 weeks   Any Other Special Instructions Will Be Listed Below (If Applicable).  If you need a refill on your cardiac medications before your next appointment, please call your pharmacy.

## 2017-05-28 NOTE — Progress Notes (Deleted)
[name] is a 36 y.o. female presents to office today for annual physical exam examination.    Concerns today include: 1. ***  Occupation: ***, Marital status: ***, Substance use: *** Diet: ***, Exercise: *** Last eye exam: *** Last dental exam: *** Last colonoscopy: *** Last mammogram: *** Last pap smear: *** Refills needed today: *** Immunizations needed: Flu Vaccine: {YES/NO/WILD OZHYQ:65784}CARDS:18581}  Tdap Vaccine: {YES/NO/WILD ONGEX:52841}CARDS:18581}  - every 218yrs - (<3 lifetime doses or unknown): all wounds -- look up need for Tetanus IG - (>=3 lifetime doses): clean/minor wound if >10618yrs from previous; all other wounds if >429yrs from previous Zoster Vaccine: {YES/NO/WILD CARDS:18581} (those >50yo, once) Pneumonia Vaccine: {YES/NO/WILD LKGMW:10272}CARDS:18581} (those w/ risk factors) - (<1436yr) Both: Immunocompromised, cochlear implant, CSF leak, asplenic, sickle cell, Chronic Renal Failure - (<4036yr) PPSV-23 only: Heart dz, lung disease, DM, tobacco abuse, alcoholism, cirrhosis/liver disease. - (>8636yr): PPSV13 then PPSV23 in 6-12mths;  - (>8536yr): repeat PPSV23 once if pt received prior to 34yo and 539yrs have passed  Past Medical History:  Diagnosis Date  . Anxiety   . Arthritis   . Depression   . Hypertension    Social History   Socioeconomic History  . Marital status: Divorced    Spouse name: Not on file  . Number of children: Not on file  . Years of education: Not on file  . Highest education level: Not on file  Social Needs  . Financial resource strain: Not on file  . Food insecurity - worry: Not on file  . Food insecurity - inability: Not on file  . Transportation needs - medical: Not on file  . Transportation needs - non-medical: Not on file  Occupational History  . Occupation: CMA  Tobacco Use  . Smoking status: Current Every Day Smoker    Packs/day: 1.00    Years: 5.00    Pack years: 5.00    Types: Cigarettes    Start date: 05/29/2006  . Smokeless tobacco: Never Used    Substance and Sexual Activity  . Alcohol use: No  . Drug use: No  . Sexual activity: Yes    Birth control/protection: Surgical    Comment: BTL  Other Topics Concern  . Not on file  Social History Narrative  . Not on file   Past Surgical History:  Procedure Laterality Date  . TUBAL LIGATION     Family History  Problem Relation Age of Onset  . Depression Mother   . Stroke Mother   . Diabetes Father   . Hypertension Father   . Stroke Father   . Heart attack Father 30  . Hypertension Sister   . Hypertension Brother   . Heart attack Brother 27       x3 MI (twin bro)  . CAD Brother   . Asthma Daughter   . Breast cancer Paternal Grandmother [id number]  . Hypertension Brother   . Healthy Daughter     Current Outpatient Medications:  .  amLODipine (NORVASC) 5 MG tablet, Take 1 tablet (5 mg total) by mouth daily., Disp: 30 tablet, Rfl: 0 .  atorvastatin (LIPITOR) 40 MG tablet, Take 1 tablet (40 mg total) by mouth daily., Disp: 90 tablet, Rfl: 0   ROS: Review of Systems {ros; complete:30496}    Physical exam {Exam, [phone]}    Assessment/ Plan: Caitlin StandardMarsha M Fields here for annual physical exam.   No problem-specific Assessment & Plan notes found for this encounter.   Counseled on healthy lifestyle choices, including diet (rich in  fruits, vegetables and lean meats and low in salt and simple carbohydrates) and exercise (at least 30 minutes of moderate physical activity daily).  Patient to follow up in 1 year for annual exam or sooner if needed.  Elleen Coulibaly M. Nadine Counts, DO

## 2017-05-28 NOTE — Telephone Encounter (Signed)
Pre-cert Verification for the following procedure    E[facility] scheduled [facility]29-17 at [facility]/2017 [facility].

## 2017-05-29 ENCOUNTER — Other Ambulatory Visit: Payer: Self-pay | Admitting: Cardiovascular Disease

## 2017-05-29 ENCOUNTER — Other Ambulatory Visit: Payer: Self-pay

## 2017-05-29 ENCOUNTER — Ambulatory Visit (INDEPENDENT_AMBULATORY_CARE_PROVIDER_SITE_OTHER): Payer: BLUE CROSS/BLUE SHIELD

## 2017-05-29 DIAGNOSIS — R079 Chest pain, unspecified: Secondary | ICD-10-CM

## 2017-06-02 ENCOUNTER — Telehealth: Payer: Self-pay | Admitting: *Deleted

## 2017-06-02 ENCOUNTER — Encounter: Payer: Self-pay | Admitting: *Deleted

## 2017-06-02 NOTE — Telephone Encounter (Signed)
Notes recorded by Lesle ChrisHill, Diante Barley G, LPN on 29/52/841312/31/2018 at 2:02 PM EST Patient notified. Copy to pmd. Follow up scheduled for 07/04/2017.   ------  Notes recorded by Laqueta LindenKoneswaran, Suresh A, MD on 05/29/2017 at 4:43 PM EST Normal.

## 2017-06-04 ENCOUNTER — Ambulatory Visit (HOSPITAL_COMMUNITY): Payer: BLUE CROSS/BLUE SHIELD | Attending: Cardiovascular Disease

## 2017-06-16 ENCOUNTER — Other Ambulatory Visit: Payer: Self-pay | Admitting: Family Medicine

## 2017-07-04 ENCOUNTER — Ambulatory Visit: Payer: Self-pay | Admitting: Cardiovascular Disease

## 2017-07-08 ENCOUNTER — Encounter: Payer: Self-pay | Admitting: Cardiovascular Disease

## 2018-03-18 ENCOUNTER — Ambulatory Visit (INDEPENDENT_AMBULATORY_CARE_PROVIDER_SITE_OTHER): Payer: Self-pay | Admitting: Pediatrics

## 2018-03-18 ENCOUNTER — Encounter: Payer: Self-pay | Admitting: Pediatrics

## 2018-03-18 VITALS — BP 157/102 | HR 111 | Temp 98.4°F | Ht 63.0 in | Wt 175.8 lb

## 2018-03-18 DIAGNOSIS — I10 Essential (primary) hypertension: Secondary | ICD-10-CM

## 2018-03-18 DIAGNOSIS — F419 Anxiety disorder, unspecified: Secondary | ICD-10-CM

## 2018-03-18 MED ORDER — ESCITALOPRAM OXALATE 10 MG PO TABS: 10.0000 mg | ORAL_TABLET | Freq: Every day | ORAL | 3 refills | Status: DC

## 2018-03-18 MED ORDER — AMLODIPINE BESYLATE 5 MG PO TABS: 5.0000 mg | ORAL_TABLET | Freq: Every day | ORAL | 3 refills | Status: DC

## 2018-03-18 NOTE — Patient Instructions (Addendum)
Call us in 2 weeks with blood pressures. Start amlodipine 5mg  daily in the morning. Let me know if blood pressures are regularly > 135 on top or >85 on bottom   Take half tab lexapro for 8 days then full tab.  DASH Eating Plan DASH stands for "Dietary Approaches to Stop Hypertension." The DASH eating plan is a healthy eating plan that has been shown to reduce high blood pressure (hypertension). It may also reduce your risk for type 2 diabetes, heart disease, and stroke. The DASH eating plan may also help with weight loss. What are tips for following this plan? General guidelines  Avoid eating more than 2,300 mg (milligrams) of salt (sodium) a day. If you have hypertension, you may need to reduce your sodium intake to 1,500 mg a day.  Limit alcohol intake to no more than 1 drink a day for nonpregnant women and 2 drinks a day for men. One drink equals 12 oz of beer, 5 oz of wine, or 1 oz of hard liquor.  Work with your health care provider to maintain a healthy body weight or to lose weight. Ask what an ideal weight is for you.  Get at least 30 minutes of exercise that causes your heart to beat faster (aerobic exercise) most days of the week. Activities may include walking, swimming, or biking.  Work with your health care provider or diet and nutrition specialist (dietitian) to adjust your eating plan to your individual calorie needs. Reading food labels  Check food labels for the amount of sodium per serving. Choose foods with less than 5 percent of the Daily Value of sodium. Generally, foods with less than 300 mg of sodium per serving fit into this eating plan.  To find whole grains, look for the word "whole" as the first word in the ingredient list. Shopping  Buy products labeled as "low-sodium" or "no salt added."  Buy fresh foods. Avoid canned foods and premade or frozen meals. Cooking  Avoid adding salt when cooking. Use salt-free seasonings or herbs instead of table salt or sea  salt. Check with your health care provider or pharmacist before using salt substitutes.  Do not fry foods. Cook foods using healthy methods such as baking, boiling, grilling, and broiling instead.  Cook with heart-healthy oils, such as olive, canola, soybean, or sunflower oil. Meal planning   Eat a balanced diet that includes: ? 5 or more servings of fruits and vegetables each day. At each meal, try to fill half of your plate with fruits and vegetables. ? Up to 6-8 servings of whole grains each day. ? Less than 6 oz of lean meat, poultry, or fish each day. A 3-oz serving of meat is about the same size as a deck of cards. One egg equals 1 oz. ? 2 servings of low-fat dairy each day. ? A serving of nuts, seeds, or beans 5 times each week. ? Heart-healthy fats. Healthy fats called Omega-3 fatty acids are found in foods such as flaxseeds and coldwater fish, like sardines, salmon, and mackerel.  Limit how much you eat of the following: ? Canned or prepackaged foods. ? Food that is high in trans fat, such as fried foods. ? Food that is high in saturated fat, such as fatty meat. ? Sweets, desserts, sugary drinks, and other foods with added sugar. ? Full-fat dairy products.  Do not salt foods before eating.  Try to eat at least 2 vegetarian meals each week.  Eat more home-cooked food and less  restaurant, buffet, and fast food.  When eating at a restaurant, ask that your food be prepared with less salt or no salt, if possible. What foods are recommended? The items listed may not be a complete list. Talk with your dietitian about what dietary choices are best for you. Grains Whole-grain or whole-wheat bread. Whole-grain or whole-wheat pasta. Brown rice. [place]. Bulgur. Whole-grain and low-sodium cereals. Pita bread. Low-fat, low-sodium crackers. Whole-wheat flour tortillas. Vegetables Fresh or frozen vegetables (raw, steamed, roasted, or grilled). Low-sodium or reduced-sodium tomato  and vegetable juice. Low-sodium or reduced-sodium tomato sauce and tomato paste. Low-sodium or reduced-sodium canned vegetables. Fruits All fresh, dried, or frozen fruit. Canned fruit in natural juice (without added sugar). Meat and other protein foods Skinless chicken or [place]. Ground chicken or [place]. Pork with fat trimmed off. Fish and seafood. Egg whites. Dried beans, peas, or lentils. Unsalted nuts, nut butters, and seeds. Unsalted canned beans. Lean cuts of beef with fat trimmed off. Low-sodium, lean deli meat. Dairy Low-fat (1%) or fat-free (skim) milk. Fat-free, low-fat, or reduced-fat cheeses. Nonfat, low-sodium ricotta or cottage cheese. Low-fat or nonfat yogurt. Low-fat, low-sodium cheese. Fats and oils Soft margarine without trans fats. Vegetable oil. Low-fat, reduced-fat, or light mayonnaise and salad dressings (reduced-sodium). Canola, safflower, olive, soybean, and sunflower oils. Avocado. Seasoning and other foods Herbs. Spices. Seasoning mixes without salt. Unsalted popcorn and pretzels. Fat-free sweets. What foods are not recommended? The items listed may not be a complete list. Talk with your dietitian about what dietary choices are best for you. Grains Baked goods made with fat, such as croissants, muffins, or some breads. Dry pasta or rice meal packs. Vegetables Creamed or fried vegetables. Vegetables in a cheese sauce. Regular canned vegetables (not low-sodium or reduced-sodium). Regular canned tomato sauce and paste (not low-sodium or reduced-sodium). Regular tomato and vegetable juice (not low-sodium or reduced-sodium). Angie Fava. Olives. Fruits Canned fruit in a light or heavy syrup. Fried fruit. Fruit in cream or butter sauce. Meat and other protein foods Fatty cuts of meat. Ribs. Fried meat. Berniece Salines. Sausage. Bologna and other processed lunch meats. Salami. Fatback. Hotdogs. Bratwurst. Salted nuts and seeds. Canned beans with added salt. Canned or smoked fish. Whole eggs  or egg yolks. Chicken or [place] with skin. Dairy Whole or 2% milk, cream, and half-and-half. Whole or full-fat cream cheese. Whole-fat or sweetened yogurt. Full-fat cheese. Nondairy creamers. Whipped toppings. Processed cheese and cheese spreads. Fats and oils Butter. Stick margarine. Lard. Shortening. Ghee. Bacon fat. Tropical oils, such as coconut, palm kernel, or palm oil. Seasoning and other foods Salted popcorn and pretzels. Onion salt, garlic salt, seasoned salt, table salt, and sea salt. Worcestershire sauce. Tartar sauce. Barbecue sauce. Teriyaki sauce. Soy sauce, including reduced-sodium. Steak sauce. Canned and packaged gravies. Fish sauce. Oyster sauce. Cocktail sauce. Horseradish that you find on the shelf. Ketchup. Mustard. Meat flavorings and tenderizers. Bouillon cubes. Hot sauce and Tabasco sauce. Premade or packaged marinades. Premade or packaged taco seasonings. Relishes. Regular salad dressings. Where to find more information:  National Heart, Lung, and [place]: https://wilson-eaton.com/  American Heart Association: www.heart.org Summary  The DASH eating plan is a healthy eating plan that has been shown to reduce high blood pressure (hypertension). It may also reduce your risk for type 2 diabetes, heart disease, and stroke.  With the DASH eating plan, you should limit salt (sodium) intake to 2,300 mg a day. If you have hypertension, you may need to reduce your sodium intake to 1,500 mg a day.  When on the DASH eating plan, aim to eat more fresh fruits and vegetables, whole grains, lean proteins, low-fat dairy, and heart-healthy fats.  Work with your health care provider or diet and nutrition specialist (dietitian) to adjust your eating plan to your individual calorie needs. This information is not intended to replace advice given to you by your health care provider. Make sure you discuss any questions you have with your health care provider. Document Released: 05/09/2011  Document Revised: 05/13/2016 Document Reviewed: 05/13/2016 Elsevier Interactive Patient Education  [employer].

## 2018-03-18 NOTE — Progress Notes (Signed)
  Subjective:   Patient ID: [name], female    DOB: 15-Jan-1981, 37 y.o.   MRN: [id number] CC: Hypertension  HPI: [name] is a 37 y.o. female   Past 2-3 weeks has had headaches. Taken BP at home, 130s/100s. Restarted amlodipine this morning. Took one tab HCTZ yesterday. Otherwise has not been on BP meds for weeks.  Drinking 6-7 beers a few times a month. Smoking down from 3ppd to 1 ppd, does smoke in home/care.  Anxiety: feeling anxious much of the time. Has hard time falling asleep because thoughts racing. Has been on xanax in the past, made her feel tired. Also been on lexapro in past, symptoms better controlled on lexapro, would like to restart.  H/o tubes tied.   Relevant past medical, surgical, family and social history reviewed. Allergies and medications reviewed and updated. Social History   Tobacco Use  Smoking Status Current Every Day Smoker  . Packs/day: 1.00  . Years: 5.00  . Pack years: 5.00  . Types: Cigarettes  . Start date: 05/29/2006  Smokeless Tobacco Never Used   ROS: Per HPI   Objective:    BP (!) 157/102   Pulse (!) 111   Temp 98.4 F (36.9 C) (Oral)   Ht 5\' 3"  (1.6 m)   Wt 175 lb 12.8 oz (79.7 kg)   BMI 31.14 kg/m   Wt Readings from Last 3 Encounters:  03/18/18 175 lb 12.8 oz (79.7 kg)  05/28/17 196 lb 12.8 oz (89.3 kg)  05/09/17 191 lb (86.6 kg)    Gen: NAD, alert, cooperative with exam, NCAT EYES: EOMI, no conjunctival injection, or no icterus ENT:  OP without erythema LYMPH: no cervical LAD CV: NRRR, normal S1/S2, no murmur, distal pulses 2+ b/l Resp: CTABL, no wheezes, normal WOB Abd: +BS, soft, NTND. no guarding or organomegaly Ext: No edema, warm Neuro: Alert and oriented MSK: normal muscle bulk  Assessment & Plan:  [name] was seen today for hypertension.  Diagnoses and all orders for this visit:  Anxiety Take half tab below for 5 days, then one tab. Take daily. -     escitalopram (LEXAPRO) 10 MG tablet; Take 1 tablet  (10 mg total) by mouth daily.  Essential hypertension Elevated. Restart below. Check BPs at home, if persistently >135 on top or >85 on bottom let me know. Call us in 2 weeks with BP readings. Pt without insurance now but planning to get within next couple of months. Discussed BP goals, DASH nutrition plan, cutting back on tobacco use, EtOH use per guidelines. -     amLODipine (NORVASC) 5 MG tablet; Take 1 tablet (5 mg total) by mouth daily.   Follow up plan: Return in about 2 months (around 05/18/2018). Rex Kras, MD [place] [facility]

## 2018-03-23 ENCOUNTER — Encounter: Payer: Self-pay | Admitting: Pediatrics

## 2018-03-23 DIAGNOSIS — I10 Essential (primary) hypertension: Secondary | ICD-10-CM

## 2018-03-24 MED ORDER — AMLODIPINE BESYLATE 10 MG PO TABS: 10.0000 mg | ORAL_TABLET | Freq: Every day | ORAL | 1 refills | Status: DC

## 2018-04-01 ENCOUNTER — Encounter: Payer: Self-pay | Admitting: Pediatrics

## 2018-04-02 ENCOUNTER — Other Ambulatory Visit: Payer: Self-pay | Admitting: Pediatrics

## 2018-04-02 DIAGNOSIS — I10 Essential (primary) hypertension: Secondary | ICD-10-CM

## 2018-04-02 MED ORDER — HYDROCHLOROTHIAZIDE 12.5 MG PO TABS: 12.5000 mg | ORAL_TABLET | Freq: Every day | ORAL | 1 refills | Status: DC

## 2018-04-17 ENCOUNTER — Encounter: Payer: Self-pay | Admitting: Pediatrics

## 2018-04-20 ENCOUNTER — Encounter: Payer: Self-pay | Admitting: Pediatrics

## 2018-04-22 ENCOUNTER — Other Ambulatory Visit: Payer: Self-pay | Admitting: Pediatrics

## 2018-04-22 DIAGNOSIS — I10 Essential (primary) hypertension: Secondary | ICD-10-CM

## 2018-04-22 MED ORDER — HYDROCHLOROTHIAZIDE 25 MG PO TABS
25.0000 mg | ORAL_TABLET | Freq: Every day | ORAL | 1 refills | Status: DC
Start: 2018-04-22 — End: 2018-10-28

## 2018-05-18 ENCOUNTER — Ambulatory Visit: Payer: Self-pay | Admitting: Family Medicine

## 2018-07-17 ENCOUNTER — Other Ambulatory Visit: Payer: Self-pay | Admitting: Pediatrics

## 2018-07-17 DIAGNOSIS — F419 Anxiety disorder, unspecified: Secondary | ICD-10-CM

## 2018-07-17 NOTE — Telephone Encounter (Signed)
Last seen 10.16.19 

## 2018-07-23 ENCOUNTER — Encounter: Payer: Self-pay | Admitting: Pediatrics

## 2018-09-22 ENCOUNTER — Ambulatory Visit: Payer: Self-pay | Admitting: Family Medicine

## 2018-10-13 DIAGNOSIS — S8262XA Displaced fracture of lateral malleolus of left fibula, initial encounter for closed fracture: Secondary | ICD-10-CM | POA: Diagnosis not present

## 2018-10-16 ENCOUNTER — Ambulatory Visit: Payer: Self-pay | Admitting: Family Medicine

## 2018-10-28 ENCOUNTER — Ambulatory Visit (INDEPENDENT_AMBULATORY_CARE_PROVIDER_SITE_OTHER): Payer: BLUE CROSS/BLUE SHIELD | Admitting: Family Medicine

## 2018-10-28 ENCOUNTER — Other Ambulatory Visit: Payer: Self-pay

## 2018-10-28 ENCOUNTER — Encounter: Payer: Self-pay | Admitting: Family Medicine

## 2018-10-28 VITALS — BP 124/87 | HR 99 | Temp 97.2°F | Ht 63.0 in

## 2018-10-28 DIAGNOSIS — F419 Anxiety disorder, unspecified: Secondary | ICD-10-CM | POA: Diagnosis not present

## 2018-10-28 DIAGNOSIS — Z8249 Family history of ischemic heart disease and other diseases of the circulatory system: Secondary | ICD-10-CM

## 2018-10-28 DIAGNOSIS — E782 Mixed hyperlipidemia: Secondary | ICD-10-CM | POA: Diagnosis not present

## 2018-10-28 DIAGNOSIS — D72829 Elevated white blood cell count, unspecified: Secondary | ICD-10-CM | POA: Diagnosis not present

## 2018-10-28 DIAGNOSIS — I10 Essential (primary) hypertension: Secondary | ICD-10-CM

## 2018-10-28 LAB — LIPID PANEL

## 2018-10-28 MED ORDER — AMLODIPINE BESYLATE 10 MG PO TABS: 10.0000 mg | ORAL_TABLET | Freq: Every day | ORAL | 3 refills | Status: DC

## 2018-10-28 MED ORDER — ATORVASTATIN CALCIUM 40 MG PO TABS: 40.0000 mg | ORAL_TABLET | Freq: Every day | ORAL | 3 refills | Status: DC

## 2018-10-28 MED ORDER — ESCITALOPRAM OXALATE 10 MG PO TABS: 10.0000 mg | ORAL_TABLET | Freq: Every day | ORAL | 3 refills | Status: DC

## 2018-10-28 NOTE — Patient Instructions (Signed)
We are discontinuing the Hydrochlorothiazide.  I want you to keep a close eye on your blood pressures.  Goal <140/90.  Call me in 2 weeks with your readings.  You had labs performed today.  You will be contacted with the results of the labs once they are available, usually in the next 3 business days for routine lab work.  If you had a pap smear or biopsy performed, expect to be contacted in about 7-10 days.  What You Need To Know About Alcohol Abuse and Dependence, Adult Alcohol is a widely available drug. People who use alcohol will consume it in varying amounts. People who drink alcohol in excess, and have behavior problems during and after drinking alcohol, may have what is called an alcohol use disorder. Alcohol abuse and alcohol dependence are the two main types of alcohol use disorders:  Alcohol abuse is when you use alcohol too much or too often. You may use alcohol to make yourself feel happy or to reduce stress, but you may have a hard time setting a limit on the amount you drink.  Alcohol dependence is when you use alcohol excessively for a period of time, and your body and brain chemistry changes as a result. This can make it hard to stop drinking because you may start to feel sick or feel different when you do not use alcohol. How can alcohol abuse and dependence affect me? Alcohol abuse and dependence can have a negative effect on your life. Excessive use of alcohol may lead to an addiction. You may feel like you need alcohol to function normally. You may drink alcohol before work in the morning, during the day, or as soon as you get home from work in the evening. These actions can result in:  Poor performance at work.  Losing your job.  Financial problems.  Car crashes or criminal charges from driving after drinking alcohol.  Problems in your relationships with friends and family.  Losing the trust and respect of co-workers, friends, and family. Drinking heavily over a long  period of time can permanently damage your body and brain, and can cause lifelong health issues, such as:  Liver disease.  Heart problems, high blood pressure, or stroke.  Damage to your pancreas.  Certain cancers.  Decreased ability to fight infections.  Numbness or tingling in hands or feet (neuropathy).  Brain damage.  Depression.  Early (premature) death. When your body craves alcohol, it is easy to drink more than your body can handle. As a result, you may overdose. Alcohol overdose is a serious situation that requires hospitalization. It may lead to permanent injuries or death. What are the benefits of avoiding alcohol use? Limiting or avoiding alcohol can help you:  Avoid risks to your body, brain, and relationships.  Avoid the risk of abusing or becoming dependent on alcohol.  Keep your mind and body healthy. As a result, you may be more likely to accomplish your life goals.  Avoid permanent injury, organ damage, or death due to alcohol use. What steps can I take to stop drinking?   The best way to avoid alcohol abuse, dependence, and addiction is not to drink at all, or to drink measured amounts. Measured drinking means no more than 1 drink a day for nonpregnant women and 2 drinks a day for men. One drink equals 12 oz of beer, 5 oz of wine, or 1 oz of hard liquor.  Stop drinking if you have been drinking too much. This can be  very hard to do if you are used to abusing alcohol. If you find it hard to stop drinking, talk about your experience with someone you trust. This person may be able to help you change your drinking behavior.  Instead of drinking alcohol, do something else, like a hobby or exercise.  Find healthy ways to cope with stress, such as exercise, meditation, or spending time with people you care about.  In social gatherings and places where there may be alcohol, make intentional choices to drink non-alcohol beverages.  If your family, co-workers, or  friends drink, talk to them about supporting you in your efforts to stop drinking. Ask them not to drink around you. Spend more time with people who do not drink alcohol.  If you think that you have an alcohol dependency problem: ? Tell friends or family about your concerns. ? Talk with your health care provider or another health professional about where to get help. ? Work with a Paramedic and a Network engineer. ? Consider joining a support group for people who struggle with alcohol abuse, dependence, and addiction. Where to find support You can get support for preventing alcohol abuse, dependence, and addiction from:  Your health care provider.  Alcoholics Anonymous (AA): SalaryStart.tn  SMART Recovery: www.smartrecovery.org  Local treatment centers or chemical dependency counselors. Where to find more information Learn more about alcohol abuse and dependence from:  Centers for Disease Control and Prevention: GreenTraditions.fi  [employer] on Alcohol Abuse and Alcoholism: CyberComps.hu  Local AA groups in your community. Contact a health care provider if:  You drink more or for longer than you intended, on more than one occasion.  You tried to stop drinking or to cut back on how much you drink, but you were not able to.  You often drink to the point of vomiting or passing out.  You want to drink so badly that you cannot think about anything else.  Drinking has created problems in your life, but you continue to drink.  You keep drinking even though you feel anxious, depressed, or have experienced memory loss.  You have stopped doing the things you used to enjoy in order to drink.  You have to drink more than you used to in order to get the effect you want.  You experience anxiety, sweating, nausea, shakiness, and trouble sleeping when you try to stop drinking.  You have  thoughts about hurting yourself or others. If you ever feel like you may hurt yourself or others, or have thoughts about taking your own life, get help right away. You can go to your nearest emergency department or call:  Your local emergency services (911 in the [place]  A suicide crisis helpline, such as the National Suicide Prevention Lifeline at 250-388-6219. This is open 24 hours a day. Summary  Alcohol is a widely available drug. Misusing, abusing, and becoming dependent on alcohol can cause many problems.  It is important to measure and limit the amount of alcohol you consume. It is recommended to limit alcohol use to 1 drink a day for nonpregnant women and 2 drinks a day for men.  The risks associated with drinking too much will have a direct negative impact on your work, relationships, and health.  If you realize that you are having some challenges keeping your drinking under control, find some ways to change your behavior. Hobbies, self calming activities, exercise, or support groups can help.  If you feel you need help with changing your drinking  habits, talk with your health care provider, a good friend, or a therapist, or go to an AA group. This information is not intended to replace advice given to you by your health care provider. Make sure you discuss any questions you have with your health care provider. Document Released: 05/14/2016 Document Revised: 05/14/2016 Document Reviewed: 05/14/2016 Elsevier Interactive Patient Education  2019 [employer]

## 2018-10-28 NOTE — Progress Notes (Signed)
Subjective: CC: f/u HTN HPI: [name] is a 38 y.o. female presenting to clinic today for:  1. Hypertension/ HLD Reports compliance with hydrochlorothiazide 25 mg daily and Norvasc 10 mg daily.  She monitors blood pressures regularly and they range between 120-140 over 60s to 90s.  She is had 2 syncopal episode since her last visit 2 years ago, with the most recent resulting in a fracture of her left lower extremity.  Her blood pressure after syncopal episode was 90s over 70s.  She is unsure if she had anything significant to eat or drink that day.  She admits to drinking 4 beers quite frequently but does not feel it is every single day.  She ran out of Lipitor and has not had it in >1 year now. No CP, SOB, LE edema.  2. Anxiety She reports Lexapro seems to be helping.  She needs refills.  No SI/HI. ETOH use as above.  Past Medical History:  Diagnosis Date  . Anxiety   . Arthritis   . Depression   . Hypertension    Past Surgical History:  Procedure Laterality Date  . TUBAL LIGATION     Social History   Socioeconomic History  . Marital status: Divorced    Spouse name: Not on file  . Number of children: Not on file  . Years of education: Not on file  . Highest education level: Not on file  Occupational History  . Occupation: CMA  Social Needs  . Financial resource strain: Not on file  . Food insecurity:    Worry: Not on file    Inability: Not on file  . Transportation needs:    Medical: Not on file    Non-medical: Not on file  Tobacco Use  . Smoking status: Current Every Day Smoker    Packs/day: 1.00    Years: 5.00    Pack years: 5.00    Types: Cigarettes    Start date: 05/29/2006  . Smokeless tobacco: Never Used  Substance and Sexual Activity  . Alcohol use: No  . Drug use: No  . Sexual activity: Yes    Birth control/protection: Surgical    Comment: BTL  Lifestyle  . Physical activity:    Days per week: Not on file    Minutes per session: Not on file   . Stress: Not on file  Relationships  . Social connections:    Talks on phone: Not on file    Gets together: Not on file    Attends religious service: Not on file    Active member of club or organization: Not on file    Attends meetings of clubs or organizations: Not on file    Relationship status: Not on file  . Intimate partner violence:    Fear of current or ex partner: Not on file    Emotionally abused: Not on file    Physically abused: Not on file    Forced sexual activity: Not on file  Other Topics Concern  . Not on file  Social History Narrative  . Not on file   Current Meds  Medication Sig  . amLODipine (NORVASC) 10 MG tablet Take 1 tablet (10 mg total) by mouth daily.  Marland Kitchen atorvastatin (LIPITOR) 40 MG tablet Take 1 tablet (40 mg total) by mouth daily.  . hydrochlorothiazide (HYDRODIURIL) 25 MG tablet Take 1 tablet (25 mg total) by mouth daily.   Family History  Problem Relation Age of Onset  . Depression Mother   .  Stroke Mother   . Diabetes Father   . Hypertension Father   . Stroke Father   . Heart attack Father 64  . Hypertension Sister   . Hypertension Brother   . Heart attack Brother 27       x3 MI (twin bro)  . CAD Brother   . Asthma Daughter   . Breast cancer Paternal Grandmother 17  . Hypertension Brother   . Healthy Daughter    Allergies  Allergen Reactions  . Penicillins   . Sulfa Antibiotics      Health Maintenance: pap smear  ROS: Per HPI  Objective: Office vital signs reviewed. BP 124/87   Pulse 99   Temp (!) 97.2 F (36.2 C) (Oral)   Ht 5' 3"  (1.6 m)   BMI 31.14 kg/m   Physical Examination:  General: Awake, alert, well nourished, well appearing female, No acute distress HEENT: Normal, sclera white, moist mucous membranes.  EOMI.  No JVD or carotid bruits. Cardio: regular rate and rhythm, S1S2 heard, no murmurs appreciated Pulm: clear to auscultation bilaterally, no wheezes, rhonchi or rales; normal work of breathing on room air  Ext: Warm, well-perfused, no edema, +2 distal pulses. Has cam walker on LLE. Psych: Mood stable, speech normal, affect appropriate, good eye contact  Depression screen [facility] 2/9 10/28/2018 03/18/2018 05/09/2017  Decreased Interest 0 0 0  Down, Depressed, Hopeless 0 0 0  PHQ - 2 Score 0 0 0  Altered sleeping 0 - -  Tired, decreased energy 0 - -  Change in appetite 0 - -  Feeling bad or failure about yourself  0 - -  Trouble concentrating 0 - -  Moving slowly or fidgety/restless 0 - -  Suicidal thoughts 0 - -  PHQ-9 Score 0 - -   No flowsheet data found.  Assessment/ Plan: 38 y.o. female   1. Essential hypertension Controlled but had recent syncopal episode.  Unsure if this is related to postmenstrual state versus vasovagal syncope versus volume dependent hypertension secondary to inadequate oral intake.  I am going to trial her off of the hydrochlorothiazide since she is also on an SSRI which would reduce sodium level and has alcohol overuse disorder which would induce a hyponatremia as well.  Check CMP.  She is to monitor blood pressures daily for the next 2 weeks and contact the office with these readings.  May need to consider adding additional agent if she has blood pressures greater than 140/90.  Would consider ACE-I versus ARB at that point. - CMP14+EGFR - amLODipine (NORVASC) 10 MG tablet; Take 1 tablet (10 mg total) by mouth daily.  Dispense: 90 tablet; Refill: 3  2. Mixed hyperlipidemia Has not been on statin in quite some time but has significant family history of early cardiovascular disease.  Check lipid panel, thyroid level and liver function tests.  Lipitor renewed - CMP14+EGFR - Lipid Panel - TSH - atorvastatin (LIPITOR) 40 MG tablet; Take 1 tablet (40 mg total) by mouth daily.  Dispense: 90 tablet; Refill: 3  3. Family history of early CAD - CMP14+EGFR - Lipid Panel - TSH  4. Anxiety Per patient controlled with Lexapro.  This is been renewed.  I counseled her about  ETOH use. Precontemplative. - escitalopram (LEXAPRO) 10 MG tablet; Take 1 tablet (10 mg total) by mouth daily.  Dispense: 90 tablet; Refill: 3  5. Leukocytosis, unspecified type Uncertain etiology.  Repeat CBC - CBC   Caitlin Windell Moulding, DO [place] [phone]

## 2018-10-29 ENCOUNTER — Encounter: Payer: Self-pay | Admitting: Family Medicine

## 2018-10-29 LAB — CBC
Hematocrit: 46.4 % (ref 34.0–46.6)
Hemoglobin: 16.1 g/dL — ABNORMAL HIGH (ref 11.1–15.9)
MCH: 31.5 pg (ref 26.6–33.0)
MCHC: 34.7 g/dL (ref 31.5–35.7)
MCV: 91 fL (ref 79–97)
Platelets: 292 10*3/uL (ref 150–450)
RBC: 5.11 x10E6/uL (ref 3.77–5.28)
RDW: 12.8 % (ref 11.7–15.4)
WBC: 10.5 10*3/uL (ref 3.4–10.8)

## 2018-10-29 LAB — LIPID PANEL
Chol/HDL Ratio: 3.5 ratio (ref 0.0–4.4)
Cholesterol, Total: 202 mg/dL — ABNORMAL HIGH (ref 100–199)
HDL: 58 mg/dL (ref >39)
LDL Calculated: 131 mg/dL — ABNORMAL HIGH (ref 0–99)
Triglycerides: 63 mg/dL (ref 0–149)
VLDL Cholesterol Cal: 13 mg/dL (ref 5–40)

## 2018-10-29 LAB — CMP14+EGFR
ALT: 13 IU/L (ref 0–32)
AST: 14 IU/L (ref 0–40)
Albumin/Globulin Ratio: 2.3 — ABNORMAL HIGH (ref 1.2–2.2)
Albumin: 4.4 g/dL (ref 3.8–4.8)
Alkaline Phosphatase: 72 IU/L (ref 39–117)
BUN/Creatinine Ratio: 11 (ref 9–23)
BUN: 9 mg/dL (ref 6–20)
Bilirubin Total: <0.2 mg/dL (ref 0.0–1.2)
CO2: 23 mmol/L (ref 20–29)
Calcium: 9.7 mg/dL (ref 8.7–10.2)
Chloride: 99 mmol/L (ref 96–106)
Creatinine, Ser: 0.81 mg/dL (ref 0.57–1.00)
GFR calc Af Amer: 109 mL/min/{1.73_m2} (ref >59)
GFR calc non Af Amer: 94 mL/min/{1.73_m2} (ref >59)
Globulin, Total: 1.9 g/dL (ref 1.5–4.5)
Glucose: 99 mg/dL (ref 65–99)
Potassium: 3.5 mmol/L (ref 3.5–5.2)
Sodium: 140 mmol/L (ref 134–144)
Total Protein: 6.3 g/dL (ref 6.0–8.5)

## 2018-10-29 LAB — TSH: TSH: 1.83 u[IU]/mL (ref 0.450–4.500)

## 2018-10-30 ENCOUNTER — Encounter: Payer: Self-pay | Admitting: Family Medicine

## 2018-11-11 DIAGNOSIS — S8262XD Displaced fracture of lateral malleolus of left fibula, subsequent encounter for closed fracture with routine healing: Secondary | ICD-10-CM | POA: Diagnosis not present

## 2018-12-03 ENCOUNTER — Encounter: Payer: Self-pay | Admitting: Family Medicine

## 2018-12-03 ENCOUNTER — Other Ambulatory Visit: Payer: Self-pay | Admitting: Physician Assistant

## 2018-12-03 MED ORDER — HYDROXYZINE PAMOATE 25 MG PO CAPS
25.0000 mg | ORAL_CAPSULE | Freq: Three times a day (TID) | ORAL | 0 refills | Status: DC | PRN
Start: 2018-12-03 — End: 2019-01-04

## 2019-01-04 ENCOUNTER — Encounter: Payer: Self-pay | Admitting: Family Medicine

## 2019-01-04 MED ORDER — HYDROXYZINE PAMOATE 25 MG PO CAPS: 25.0000 mg | ORAL_CAPSULE | Freq: Three times a day (TID) | ORAL | 0 refills | Status: DC | PRN

## 2019-01-28 ENCOUNTER — Encounter: Payer: Self-pay | Admitting: Family Medicine

## 2019-02-25 ENCOUNTER — Other Ambulatory Visit: Payer: Self-pay | Admitting: Family Medicine

## 2019-03-02 ENCOUNTER — Telehealth: Payer: Self-pay | Admitting: Family Medicine

## 2019-03-02 ENCOUNTER — Encounter: Payer: Self-pay | Admitting: *Deleted

## 2019-03-02 ENCOUNTER — Other Ambulatory Visit: Payer: Self-pay | Admitting: Family Medicine

## 2019-03-02 ENCOUNTER — Encounter: Payer: Self-pay | Admitting: Family Medicine

## 2019-03-02 DIAGNOSIS — B9689 Other specified bacterial agents as the cause of diseases classified elsewhere: Secondary | ICD-10-CM

## 2019-03-02 MED ORDER — CEFDINIR 300 MG PO CAPS: 300.0000 mg | ORAL_CAPSULE | Freq: Two times a day (BID) | ORAL | 0 refills | Status: DC

## 2019-03-02 NOTE — Telephone Encounter (Signed)
My Chart message sent

## 2019-03-06 ENCOUNTER — Encounter: Payer: Self-pay | Admitting: Family Medicine

## 2019-03-08 ENCOUNTER — Other Ambulatory Visit: Payer: Self-pay | Admitting: Family Medicine

## 2019-04-11 ENCOUNTER — Encounter: Payer: Self-pay | Admitting: Family Medicine

## 2019-04-12 ENCOUNTER — Other Ambulatory Visit: Payer: Self-pay | Admitting: Family Medicine

## 2019-04-12 DIAGNOSIS — R600 Localized edema: Secondary | ICD-10-CM

## 2019-04-12 DIAGNOSIS — I10 Essential (primary) hypertension: Secondary | ICD-10-CM

## 2019-04-12 MED ORDER — HYDROCHLOROTHIAZIDE 12.5 MG PO TABS: 12.5000 mg | ORAL_TABLET | Freq: Every day | ORAL | 3 refills | Status: DC

## 2019-06-10 ENCOUNTER — Encounter: Payer: Self-pay | Admitting: Family Medicine

## 2019-06-11 ENCOUNTER — Other Ambulatory Visit: Payer: Self-pay | Admitting: Family Medicine

## 2019-06-11 DIAGNOSIS — B9689 Other specified bacterial agents as the cause of diseases classified elsewhere: Secondary | ICD-10-CM

## 2019-07-05 ENCOUNTER — Other Ambulatory Visit: Payer: Self-pay | Admitting: Family Medicine

## 2019-07-05 DIAGNOSIS — B9689 Other specified bacterial agents as the cause of diseases classified elsewhere: Secondary | ICD-10-CM

## 2019-10-19 ENCOUNTER — Other Ambulatory Visit: Payer: Self-pay | Admitting: Family Medicine

## 2019-10-19 DIAGNOSIS — F419 Anxiety disorder, unspecified: Secondary | ICD-10-CM

## 2019-10-19 DIAGNOSIS — I10 Essential (primary) hypertension: Secondary | ICD-10-CM

## 2019-11-19 ENCOUNTER — Other Ambulatory Visit: Payer: Self-pay | Admitting: Family Medicine

## 2019-11-19 DIAGNOSIS — F419 Anxiety disorder, unspecified: Secondary | ICD-10-CM

## 2019-11-19 DIAGNOSIS — I10 Essential (primary) hypertension: Secondary | ICD-10-CM

## 2019-11-22 ENCOUNTER — Other Ambulatory Visit: Payer: Self-pay | Admitting: Family Medicine

## 2019-11-22 ENCOUNTER — Telehealth: Payer: Self-pay | Admitting: Family Medicine

## 2019-11-22 DIAGNOSIS — I10 Essential (primary) hypertension: Secondary | ICD-10-CM

## 2019-11-22 DIAGNOSIS — R600 Localized edema: Secondary | ICD-10-CM

## 2019-11-22 MED ORDER — HYDROCHLOROTHIAZIDE 12.5 MG PO TABS: 12.5000 mg | ORAL_TABLET | Freq: Every day | ORAL | 3 refills | Status: DC

## 2019-11-22 MED ORDER — AMLODIPINE BESYLATE 10 MG PO TABS: 10.0000 mg | ORAL_TABLET | Freq: Every day | ORAL | 0 refills | Status: DC

## 2019-11-22 NOTE — Telephone Encounter (Signed)
Left detailed message.   

## 2019-11-22 NOTE — Telephone Encounter (Signed)
  Prescription Request  11/22/2019  What is the name of the medication or equipment? Pt is checking on blood pressure med refill and rx for anxiety  Have you contacted your pharmacy to request a refill? (if applicable) yes  Which pharmacy would you like this sent to? [facility]   Patient notified that their request is being sent to the clinical staff for review and that they should receive a response within 2 business days.

## 2019-11-22 NOTE — Telephone Encounter (Signed)
sent 

## 2019-11-22 NOTE — Telephone Encounter (Signed)
[place]. NTBS 30 days given 10/20/19

## 2019-11-22 NOTE — Telephone Encounter (Signed)
Patient has a follow up appointment scheduled for July 6,2021. Given one month refill on these medications on 10-20-19.  Last office visit was 10-28-18.  Please advise on one more month refills?

## 2019-11-23 MED ORDER — ESCITALOPRAM OXALATE 10 MG PO TABS: 10.0000 mg | ORAL_TABLET | Freq: Every day | ORAL | 0 refills | Status: DC

## 2019-11-23 NOTE — Addendum Note (Signed)
Addended by: Hessie Diener on: 11/23/2019 02:14 PM   Modules accepted: Orders

## 2019-11-23 NOTE — Telephone Encounter (Signed)
Pt has appt scheduled 12/07/19 so sent in 30 day refill on Lexapro. Amlodipine was already filled in another encounter.

## 2019-12-05 ENCOUNTER — Emergency Department (HOSPITAL_COMMUNITY): Payer: Self-pay

## 2019-12-05 ENCOUNTER — Other Ambulatory Visit: Payer: Self-pay

## 2019-12-05 ENCOUNTER — Emergency Department (HOSPITAL_COMMUNITY)
Admission: EM | Admit: 2019-12-05 | Discharge: 2019-12-05 | Disposition: A | Discharge status: Home / Self Care | Payer: Self-pay | Attending: Emergency Medicine | Admitting: Emergency Medicine

## 2019-12-05 ENCOUNTER — Encounter (HOSPITAL_COMMUNITY): Payer: Self-pay | Admitting: *Deleted

## 2019-12-05 DIAGNOSIS — N939 Abnormal uterine and vaginal bleeding, unspecified: Secondary | ICD-10-CM

## 2019-12-05 DIAGNOSIS — O10019 Pre-existing essential hypertension complicating pregnancy, unspecified trimester: Secondary | ICD-10-CM | POA: Insufficient documentation

## 2019-12-05 DIAGNOSIS — R52 Pain, unspecified: Secondary | ICD-10-CM

## 2019-12-05 DIAGNOSIS — O209 Hemorrhage in early pregnancy, unspecified: Secondary | ICD-10-CM | POA: Insufficient documentation

## 2019-12-05 DIAGNOSIS — O99330 Smoking (tobacco) complicating pregnancy, unspecified trimester: Secondary | ICD-10-CM | POA: Insufficient documentation

## 2019-12-05 DIAGNOSIS — Z79899 Other long term (current) drug therapy: Secondary | ICD-10-CM | POA: Insufficient documentation

## 2019-12-05 DIAGNOSIS — F17210 Nicotine dependence, cigarettes, uncomplicated: Secondary | ICD-10-CM | POA: Insufficient documentation

## 2019-12-05 DIAGNOSIS — Z3A00 Weeks of gestation of pregnancy not specified: Secondary | ICD-10-CM | POA: Insufficient documentation

## 2019-12-05 LAB — CBC WITH DIFFERENTIAL/PLATELET
Abs Immature Granulocytes: 0.03 10*3/uL (ref 0.00–0.07)
Basophils Absolute: 0 10*3/uL (ref 0.0–0.1)
Basophils Relative: 0 %
Eosinophils Absolute: 0.3 10*3/uL (ref 0.0–0.5)
Eosinophils Relative: 3 %
HCT: 43.8 % (ref 36.0–46.0)
Hemoglobin: 14.5 g/dL (ref 12.0–15.0)
Immature Granulocytes: 0 %
Lymphocytes Relative: 21 %
Lymphs Abs: 1.9 10*3/uL (ref 0.7–4.0)
MCH: 30.9 pg (ref 26.0–34.0)
MCHC: 33.1 g/dL (ref 30.0–36.0)
MCV: 93.2 fL (ref 80.0–100.0)
Monocytes Absolute: 0.5 10*3/uL (ref 0.1–1.0)
Monocytes Relative: 6 %
Neutro Abs: 6.3 10*3/uL (ref 1.7–7.7)
Neutrophils Relative %: 70 %
Platelets: 253 10*3/uL (ref 150–400)
RBC: 4.7 MIL/uL (ref 3.87–5.11)
RDW: 14 % (ref 11.5–15.5)
WBC: 9.1 10*3/uL (ref 4.0–10.5)
nRBC: 0 % (ref 0.0–0.2)

## 2019-12-05 LAB — COMPREHENSIVE METABOLIC PANEL
ALT: 15 U/L (ref 0–44)
AST: 15 U/L (ref 15–41)
Albumin: 4.4 g/dL (ref 3.5–5.0)
Alkaline Phosphatase: 65 U/L (ref 38–126)
Anion gap: 11 (ref 5–15)
BUN: 9 mg/dL (ref 6–20)
CO2: 21 mmol/L — ABNORMAL LOW (ref 22–32)
Calcium: 9 mg/dL (ref 8.9–10.3)
Chloride: 104 mmol/L (ref 98–111)
Creatinine, Ser: 0.7 mg/dL (ref 0.44–1.00)
GFR calc Af Amer: >60 mL/min (ref >60)
GFR calc non Af Amer: >60 mL/min (ref >60)
Glucose, Bld: 112 mg/dL — ABNORMAL HIGH (ref 70–99)
Potassium: 3.8 mmol/L (ref 3.5–5.1)
Sodium: 136 mmol/L (ref 135–145)
Total Bilirubin: 0.8 mg/dL (ref 0.3–1.2)
Total Protein: 7.4 g/dL (ref 6.5–8.1)

## 2019-12-05 LAB — HCG, QUANTITATIVE, PREGNANCY: hCG, Beta Chain, Quant, S: 871 m[IU]/mL — ABNORMAL HIGH (ref <5)

## 2019-12-05 MED ORDER — SODIUM CHLORIDE 0.9 % IV BOLUS
500.0000 mL | Freq: Once | INTRAVENOUS | Status: AC
  Administered 2019-12-05: 500 mL via INTRAVENOUS

## 2019-12-05 NOTE — ED Triage Notes (Signed)
Patient comes to the ED cramping and vaginal bleeding for one day.  Patient reports tubal ligation for 13 years, and a positive home pregnancy test on July 2.

## 2019-12-05 NOTE — ED Notes (Signed)
Unsuccessful IV stick  Flashback with labs drawn, but not able to advance  Robin, RN, CN will attempt

## 2019-12-05 NOTE — ED Notes (Signed)
Call to US 

## 2019-12-05 NOTE — ED Provider Notes (Signed)
[facility] EMERGENCY DEPARTMENT Provider Note   CSN: [id number] Arrival date & time: 12/05/19  1020     History Chief Complaint  Patient presents with  . Vaginal Bleeding    [name] is a 39 y.o. female.  Patient states that she had a tubal ligation and she missed her last. I did a pregnancy test that was positive. She had some bleeding yesterday along with some lower abdominal pain  The history is provided by the patient and medical records. No language interpreter was used.  Vaginal Bleeding Quality:  Spotting Severity:  Mild Onset quality:  Sudden Timing:  Intermittent Progression:  Waxing and waning Chronicity:  New Menstrual history:  Irregular Possible pregnancy: no   Context: not after intercourse   Relieved by:  Nothing Worsened by:  Nothing Ineffective treatments:  None tried Associated symptoms: no abdominal pain, no back pain and no fatigue        Past Medical History:  Diagnosis Date  . Anxiety   . Arthritis   . Depression   . Hx of tubal ligation 2008  . Hypertension     Patient Active Problem List   Diagnosis Date Noted  . Mixed hyperlipidemia 10/28/2018  . Essential hypertension 05/09/2017  . Family history of early CAD 05/09/2017  . Atypical chest pain 05/09/2017  . Abnormal menstrual periods 05/09/2017  . Anxiety state 05/09/2017  . Tobacco use disorder 05/09/2017    Past Surgical History:  Procedure Laterality Date  . TUBAL LIGATION       OB History   No obstetric history on file.     Family History  Problem Relation Age of Onset  . Depression Mother   . Stroke Mother   . Diabetes Father   . Hypertension Father   . Stroke Father   . Heart attack Father 30  . Hypertension Sister   . Hypertension Brother   . Heart attack Brother 27       x3 MI (twin bro)  . CAD Brother   . Asthma Daughter   . Breast cancer Paternal Grandmother 57  . Hypertension Brother   . Healthy Daughter     Social History   Tobacco Use    . Smoking status: Current Every Day Smoker    Packs/day: 1.00    Years: 5.00    Pack years: 5.00    Types: Cigarettes    Start date: 05/29/2006  . Smokeless tobacco: Never Used  Vaping Use  . Vaping Use: Former  Substance Use Topics  . Alcohol use: No  . Drug use: No    Home Medications Prior to Admission medications   Medication Sig Start Date End Date Taking? Authorizing Provider  amLODipine (NORVASC) 10 MG tablet Take 1 tablet (10 mg total) by mouth daily. (Needs to be seen before next refill) 11/22/19  Yes Delynn Flavin M, DO  atorvastatin (LIPITOR) 40 MG tablet Take 1 tablet (40 mg total) by mouth daily. Patient not taking: Reported on 12/05/2019 10/28/18   Raliegh Ip, DO  cefdinir (OMNICEF) 300 MG capsule Take 1 capsule (300 mg total) by mouth 2 (two) times daily. 1 po BID Patient not taking: Reported on 12/05/2019 03/02/19   Raliegh Ip, DO  escitalopram (LEXAPRO) 10 MG tablet Take 1 tablet (10 mg total) by mouth daily. (Needs to be seen before next refill) Patient not taking: Reported on 12/05/2019 11/23/19   Raliegh Ip, DO  hydrochlorothiazide (HYDRODIURIL) 12.5 MG tablet Take 1 tablet (12.5 mg  total) by mouth daily. Patient not taking: Reported on 12/05/2019 11/22/19   Raliegh Ip, DO  hydrOXYzine (VISTARIL) 25 MG capsule TAKE 1 CAPSULE BY MOUTH EVERY 8 HOURS AS NEEDED FOR ANXIETY Patient not taking: Reported on 12/05/2019 02/26/19   Raliegh Ip, DO    Allergies    Penicillins and Sulfa antibiotics  Review of Systems   Review of Systems  Constitutional: Negative for appetite change and fatigue.  HENT: Negative for congestion, ear discharge and sinus pressure.   Eyes: Negative for discharge.  Respiratory: Negative for cough.   Cardiovascular: Negative for chest pain.  Gastrointestinal: Negative for abdominal pain and diarrhea.  Genitourinary: Positive for vaginal bleeding. Negative for frequency and hematuria.  Musculoskeletal:  Negative for back pain.  Skin: Negative for rash.  Neurological: Negative for seizures and headaches.  Psychiatric/Behavioral: Negative for hallucinations.    Physical Exam Updated Vital Signs BP 135/90   Pulse 88   Temp 97.8 F (36.6 C) (Oral)   Resp 18   Ht 5\' 3"  (1.6 m)   Wt 89.4 kg   LMP 10/12/2019   SpO2 100%   BMI 34.90 kg/m   Physical Exam Vitals reviewed.  Constitutional:      Appearance: She is well-developed.  HENT:     Head: Normocephalic.     Nose: Nose normal.  Eyes:     General: No scleral icterus.    Conjunctiva/sclera: Conjunctivae normal.  Neck:     Thyroid: No thyromegaly.  Cardiovascular:     Rate and Rhythm: Normal rate and regular rhythm.     Heart sounds: No murmur heard.  No friction rub. No gallop.   Pulmonary:     Breath sounds: No stridor. No wheezing or rales.  Chest:     Chest wall: No tenderness.  Abdominal:     General: There is no distension.     Tenderness: There is no abdominal tenderness. There is no rebound.     Comments: Mild suprapubic tenderness  Musculoskeletal:        General: Normal range of motion.     Cervical back: Neck supple.  Lymphadenopathy:     Cervical: No cervical adenopathy.  Skin:    Findings: No erythema or rash.  Neurological:     Mental Status: She is alert and oriented to person, place, and time.     Motor: No abnormal muscle tone.     Coordination: Coordination normal.  Psychiatric:        Behavior: Behavior normal.     ED Results / Procedures / Treatments   Labs (all labs ordered are listed, but only abnormal results are displayed) Labs Reviewed  COMPREHENSIVE METABOLIC PANEL - Abnormal; Notable for the following components:      Result Value   CO2 21 (*)    Glucose, Bld 112 (*)    All other components within normal limits  HCG, QUANTITATIVE, PREGNANCY - Abnormal; Notable for the following components:   hCG, Beta Chain, Quant, S 871 (*)    All other components within normal limits  CBC  WITH DIFFERENTIAL/PLATELET    EKG None  Radiology 12/12/2019 OB LESS THAN 14 WEEKS WITH OB TRANSVAGINAL  Result Date: 12/05/2019 CLINICAL DATA:  Cramping and vaginal bleeding. Quantitative beta HCG 871. Previous tubal ligation 2008. LMP 10/16/2019. EXAM: OBSTETRIC <14 WK 10/18/2019 AND TRANSVAGINAL OB US TECHNIQUE: Both transabdominal and transvaginal ultrasound examinations were performed for complete evaluation of the gestation as well as the maternal uterus, adnexal regions, and pelvic  cul-de-sac. Transvaginal technique was performed to assess early pregnancy. COMPARISON:  None. FINDINGS: Intrauterine gestational sac: Not visualized. Yolk sac:  Not visualized. Embryo:  Not visualized. Cardiac Activity: Not visualized. Heart Rate: Not visualized.  Bpm MSD: Not visualized. CRL:  Not visualized. Subchorionic hemorrhage:  None visualized. Maternal uterus/adnexae: Uterus is normal size, shape and position. Endometrium is within normal measuring 10-14 mm. Ovaries are normal size, shape and position with normal color Doppler. No free pelvic fluid. IMPRESSION: No intrauterine gestational sac, yolk sac, fetal pole, or cardiac activity visualized. Differential considerations include intrauterine gestation too early to be sonographically visualized, spontaneous abortion, or ectopic pregnancy. Consider follow-up ultrasound in 14 days and serial quantitative beta HCG follow-up. Electronically Signed   By: Elberta Fortis M.D.   On: 12/05/2019 13:35    Procedures Procedures (including critical care time)  Medications Ordered in ED Medications  sodium chloride 0.9 % bolus 500 mL (0 mLs Intravenous Stopped 12/05/19 1221)    ED Course  I have reviewed the triage vital signs and the nursing notes.  Pertinent labs & imaging results that were available during my care of the patient were reviewed by me and considered in my medical decision making (see chart for details).    MDM Rules/Calculators/A&P                           Patient with positive pregnancy test and tubal ligation. Her quantitative is over 800. Ultrasound did not show any intrauterine pregnancy. I spoke with Dr. Adrian Blackwater and he is making arrangements for the patient to be seen in the clinic in 2 days to get a repeat quantitative and evaluation. Patient was instructed to return to [facility] if she develops severe bleeding pain or weakness         This patient presents to the ED for concern of vaginal bleeding, this involves an extensive number of treatment options, and is a complaint that carries with it a high risk of complications and morbidity.  The differential diagnosis includes ectopic pregnancy spontaneous abortion   Lab Tests:   I Ordered, reviewed, and interpreted labs, which included CBC and chemistries and beta quantitative hCG which was elevated at 800  Medicines ordered:   I ordered no medicines  Imaging Studies ordered:   I ordered imaging studies which included ultrasound pelvis and  I independently visualized and interpreted imaging which showed no intrauterine pregnancy  Additional history obtained:   Additional history obtained from records  Previous records obtained and reviewed.  Consultations Obtained:   I consulted OB/GYN and discussed lab and imaging findings  Reevaluation:  After the interventions stated above, I reevaluated the patient and found unchanged  Critical Interventions:  .   Final Clinical Impression(s) / ED Diagnoses Final diagnoses:  Vaginal bleeding    Rx / DC Orders ED Discharge Orders    None       Bethann Berkshire, MD 12/05/19 1456

## 2019-12-05 NOTE — Discharge Instructions (Addendum)
You should get a call from the [facility] at [facility] on Tuesday morning. You need to be seen on Tuesday and also have your blood drawn. If you develop heavy bleeding severe abdominal pain or just feel weak you need to go to [facility] women's department to be seen

## 2019-12-05 NOTE — ED Notes (Signed)
Patient wants IV out of arm due to pain. No signs or symptoms of infiltration or phlebitis. IV removed per request.

## 2019-12-07 ENCOUNTER — Encounter: Payer: Self-pay | Admitting: Family Medicine

## 2019-12-07 ENCOUNTER — Ambulatory Visit (INDEPENDENT_AMBULATORY_CARE_PROVIDER_SITE_OTHER): Payer: Self-pay | Admitting: Family Medicine

## 2019-12-07 ENCOUNTER — Inpatient Hospital Stay (HOSPITAL_COMMUNITY)
Admission: AD | Admit: 2019-12-07 | Discharge: 2019-12-07 | Discharge status: Against Medical Advice | Payer: Self-pay | Attending: Obstetrics and Gynecology | Admitting: Obstetrics and Gynecology

## 2019-12-07 ENCOUNTER — Other Ambulatory Visit: Payer: Self-pay

## 2019-12-07 ENCOUNTER — Ambulatory Visit (INDEPENDENT_AMBULATORY_CARE_PROVIDER_SITE_OTHER): Payer: Self-pay | Admitting: *Deleted

## 2019-12-07 VITALS — BP 137/95 | HR 101

## 2019-12-07 VITALS — BP 120/86 | HR 98 | Temp 98.4°F | Ht 63.0 in | Wt 193.2 lb

## 2019-12-07 DIAGNOSIS — O3680X0 Pregnancy with inconclusive fetal viability, not applicable or unspecified: Secondary | ICD-10-CM

## 2019-12-07 DIAGNOSIS — I10 Essential (primary) hypertension: Secondary | ICD-10-CM

## 2019-12-07 DIAGNOSIS — Z3491 Encounter for supervision of normal pregnancy, unspecified, first trimester: Secondary | ICD-10-CM

## 2019-12-07 DIAGNOSIS — F419 Anxiety disorder, unspecified: Secondary | ICD-10-CM

## 2019-12-07 LAB — BETA HCG QUANT (REF LAB): hCG Quant: 1127 m[IU]/mL

## 2019-12-07 MED ORDER — ESCITALOPRAM OXALATE 20 MG PO TABS: 20.0000 mg | ORAL_TABLET | Freq: Every day | ORAL | 5 refills | Status: DC

## 2019-12-07 NOTE — Progress Notes (Signed)
Subjective: CC: Follow-up hypertension, anxiety PCP: Raliegh Ip, DO GHW:EXHBZJ M [name] is a 39 y.o. female presenting to clinic today for:  1.  Generalized anxiety disorder/ HTN Patient reports increased anxiety since finding out that she was pregnant.  She has history of BTL and recently found out that she was [redacted] weeks pregnant.  She establish care with OB/GYN in [place] today.  She had labs performed.  Apparently they cannot find the sac.  Unsure if this is an ectopic pregnancy at this point.    She has discontinued the atorvastatin, hydrochlorothiazide and hydroxyzine.  She continues the amlodipine and has been on the Lexapro until about 2 to 3 weeks ago when she ran out.  However, she was having increased anxiety prior to discontinuation of the 10 mg and would like to go ahead and go up on the medication.  ROS: Per HPI  Allergies  Allergen Reactions  . Penicillins   . Sulfa Antibiotics    Past Medical History:  Diagnosis Date  . Anxiety   . Arthritis   . Depression   . Hx of tubal ligation 2008  . Hypertension     Current Outpatient Medications:  .  amLODipine (NORVASC) 10 MG tablet, Take 1 tablet (10 mg total) by mouth daily. (Needs to be seen before next refill), Disp: 30 tablet, Rfl: 0 .  famotidine (PEPCID) 20 MG tablet, Take 20 mg by mouth daily., Disp: , Rfl:  .  atorvastatin (LIPITOR) 40 MG tablet, Take 1 tablet (40 mg total) by mouth daily. (Patient not taking: Reported on 12/07/2019), Disp: 90 tablet, Rfl: 3 .  escitalopram (LEXAPRO) 10 MG tablet, Take 1 tablet (10 mg total) by mouth daily. (Needs to be seen before next refill) (Patient not taking: Reported on 12/07/2019), Disp: 30 tablet, Rfl: 0 .  hydrochlorothiazide (HYDRODIURIL) 12.5 MG tablet, Take 1 tablet (12.5 mg total) by mouth daily. (Patient not taking: Reported on 12/07/2019), Disp: 90 tablet, Rfl: 3 .  hydrOXYzine (VISTARIL) 25 MG capsule, TAKE 1 CAPSULE BY MOUTH EVERY 8 HOURS AS NEEDED FOR ANXIETY  (Patient not taking: Reported on 12/07/2019), Disp: 60 capsule, Rfl: 0 Social History   Socioeconomic History  . Marital status: Divorced    Spouse name: Not on file  . Number of children: Not on file  . Years of education: Not on file  . Highest education level: Not on file  Occupational History  . Occupation: CMA  Tobacco Use  . Smoking status: Current Every Day Smoker    Packs/day: 1.00    Years: 5.00    Pack years: 5.00    Types: Cigarettes    Start date: 05/29/2006  . Smokeless tobacco: Never Used  Vaping Use  . Vaping Use: Former  Substance and Sexual Activity  . Alcohol use: No  . Drug use: No  . Sexual activity: Yes    Birth control/protection: Surgical    Comment: BTL  Other Topics Concern  . Not on file  Social History Narrative  . Not on file   Social Determinants of Health   Financial Resource Strain:   . Difficulty of Paying Living Expenses:   Food Insecurity: No Food Insecurity  . Worried About Programme researcher, broadcasting/film/video in the Last Year: Never true  . Ran Out of Food in the Last Year: Never true  Transportation Needs: No Transportation Needs  . Lack of Transportation (Medical): No  . Lack of Transportation (Non-Medical): No  Physical Activity:   . Days of  Exercise per Week:   . Minutes of Exercise per Session:   Stress:   . Feeling of Stress :   Social Connections:   . Frequency of Communication with Friends and Family:   . Frequency of Social Gatherings with Friends and Family:   . Attends Religious Services:   . Active Member of Clubs or Organizations:   . Attends Banker Meetings:   Marland Kitchen Marital Status:   Intimate Partner Violence:   . Fear of Current or Ex-Partner:   . Emotionally Abused:   Marland Kitchen Physically Abused:   . Sexually Abused:    Family History  Problem Relation Age of Onset  . Depression Mother   . Stroke Mother   . Diabetes Father   . Hypertension Father   . Stroke Father   . Heart attack Father 30  . Hypertension Sister     . Hypertension Brother   . Heart attack Brother 27       x3 MI (twin bro)  . CAD Brother   . Asthma Daughter   . Breast cancer Paternal Grandmother 40  . Hypertension Brother   . Healthy Daughter     Objective: Office vital signs reviewed. BP 120/86   Pulse 98   Temp 98.4 F (36.9 C)   Ht 5\' 3"  (1.6 m)   Wt 193 lb 3.2 oz (87.6 kg)   LMP 10/12/2019   SpO2 99%   BMI 34.22 kg/m   Physical Examination:  General: Awake, alert, well nourished, No acute distress HEENT: Normal Cardio: regular rate and rhythm, S1S2 heard, no murmurs appreciated Pulm: clear to auscultation bilaterally, no wheezes, rhonchi or rales; normal work of breathing on room air Extremities: warm, well perfused, No edema, cyanosis or clubbing; +2 pulses bilaterally Psych: Mood somewhat labile.  Appears anxious  Depression screen [facility] 2/9 12/07/2019 12/07/2019 10/28/2018  Decreased Interest 0 0 0  Down, Depressed, Hopeless 0 0 0  PHQ - 2 Score 0 0 0  Altered sleeping 3 3 0  Tired, decreased energy 0 3 0  Change in appetite 0 0 0  Feeling bad or failure about yourself  0 0 0  Trouble concentrating 0 0 0  Moving slowly or fidgety/restless 0 0 0  Suicidal thoughts 0 0 0  PHQ-9 Score 3 6 0  Difficult doing work/chores Not difficult at all - -   GAD 7 : Generalized Anxiety Score 12/07/2019 12/07/2019  Nervous, Anxious, on Edge 3 3  Control/stop worrying 3 3  Worry too much - different things 3 3  Trouble relaxing 3 3  Restless 3 3  Easily annoyed or irritable 3 3  Afraid - awful might happen 3 3  Total GAD 7 Score 21 21  Anxiety Difficulty Extremely difficult -   Assessment/ Plan: 39 y.o. female   1. Essential hypertension Controlled.  Continue amlodipine for now.  I am going to reach out to her OB/GYN to make sure that the calcium channel blockers okay to continue.  Advised to discontinue hydrochlorothiazide.  2. First trimester pregnancy Being worked up by OB/GYN.  Reviewed her most recent hCG which is  consistent with 5 weeks of pregnancy.  Reviewed her most recent OB ultrasound which showed no intrauterine pregnancy.  She has serial ultrasound and hCG scheduled.  3. Anxiety Not controlled.  Increase to Lexapro 20 mg daily.  We discussed the potential risks.  - escitalopram (LEXAPRO) 20 MG tablet; Take 1 tablet (20 mg total) by mouth daily.  Dispense: 30  tablet; Refill: 5   No orders of the defined types were placed in this encounter.  No orders of the defined types were placed in this encounter.    Raliegh Ip, DO [facility] [phone]

## 2019-12-07 NOTE — MAU Note (Signed)
Registration called to let RN know that patient desires to leave AMA.   Pt signed AMA form and states she will be back tomorrow.   Provider Sabas Sous, CNM made aware

## 2019-12-07 NOTE — Progress Notes (Signed)
Pt reports having abdominal cramping - pain scale 4 which is the same as at last visit in ED on 7/4. She also has been having some vaginal spotting which is bright red in the morning and then changes to brown later in the Ferrah Panagopoulos. She denies passing clots or tissue. Stat BHCG drawn and pt was advised she will be called later today with results and plan of care recommendation. Pt voiced understanding and stated that a detailed message can be left on her VM if she does not answer.   1450  Discussed results with Dr. Vergie Living who finds inappropriate rise in BHCG level. He recommends pt to have evaluation and repeat US today @ [facility].    1640  Called pt and informed her of BHCG results and plan of care recommendation. Pt voiced understanding and agreed to go to [facility] for evaluation. Report called to clinical staff @ [facility].

## 2019-12-08 ENCOUNTER — Inpatient Hospital Stay (HOSPITAL_COMMUNITY)
Admission: AD | Admit: 2019-12-08 | Discharge: 2019-12-08 | Disposition: A | Discharge status: Home / Self Care | Payer: Self-pay | Attending: Obstetrics and Gynecology | Admitting: Obstetrics and Gynecology

## 2019-12-08 ENCOUNTER — Inpatient Hospital Stay (HOSPITAL_COMMUNITY): Payer: Self-pay

## 2019-12-08 ENCOUNTER — Encounter (HOSPITAL_COMMUNITY): Payer: Self-pay

## 2019-12-08 DIAGNOSIS — Z79899 Other long term (current) drug therapy: Secondary | ICD-10-CM | POA: Insufficient documentation

## 2019-12-08 DIAGNOSIS — F419 Anxiety disorder, unspecified: Secondary | ICD-10-CM | POA: Insufficient documentation

## 2019-12-08 DIAGNOSIS — F17210 Nicotine dependence, cigarettes, uncomplicated: Secondary | ICD-10-CM | POA: Insufficient documentation

## 2019-12-08 DIAGNOSIS — R109 Unspecified abdominal pain: Secondary | ICD-10-CM | POA: Insufficient documentation

## 2019-12-08 DIAGNOSIS — O209 Hemorrhage in early pregnancy, unspecified: Secondary | ICD-10-CM | POA: Insufficient documentation

## 2019-12-08 DIAGNOSIS — O10011 Pre-existing essential hypertension complicating pregnancy, first trimester: Secondary | ICD-10-CM | POA: Insufficient documentation

## 2019-12-08 DIAGNOSIS — Z9851 Tubal ligation status: Secondary | ICD-10-CM | POA: Insufficient documentation

## 2019-12-08 DIAGNOSIS — O26891 Other specified pregnancy related conditions, first trimester: Secondary | ICD-10-CM | POA: Insufficient documentation

## 2019-12-08 DIAGNOSIS — O3680X0 Pregnancy with inconclusive fetal viability, not applicable or unspecified: Secondary | ICD-10-CM | POA: Insufficient documentation

## 2019-12-08 DIAGNOSIS — O99341 Other mental disorders complicating pregnancy, first trimester: Secondary | ICD-10-CM | POA: Insufficient documentation

## 2019-12-08 DIAGNOSIS — F329 Major depressive disorder, single episode, unspecified: Secondary | ICD-10-CM | POA: Insufficient documentation

## 2019-12-08 DIAGNOSIS — O009 Unspecified ectopic pregnancy without intrauterine pregnancy: Secondary | ICD-10-CM

## 2019-12-08 DIAGNOSIS — O99331 Smoking (tobacco) complicating pregnancy, first trimester: Secondary | ICD-10-CM | POA: Insufficient documentation

## 2019-12-08 DIAGNOSIS — Z3A08 8 weeks gestation of pregnancy: Secondary | ICD-10-CM | POA: Insufficient documentation

## 2019-12-08 LAB — TYPE AND SCREEN
ABO/RH(D): O POS
Antibody Screen: NEGATIVE

## 2019-12-08 LAB — URINALYSIS, ROUTINE W REFLEX MICROSCOPIC
Bilirubin Urine: NEGATIVE
Glucose, UA: NEGATIVE mg/dL
Ketones, ur: NEGATIVE mg/dL
Nitrite: NEGATIVE
Protein, ur: NEGATIVE mg/dL
Specific Gravity, Urine: 1.009 (ref 1.005–1.030)
pH: 6 (ref 5.0–8.0)

## 2019-12-08 LAB — CBC
HCT: 43.2 % (ref 36.0–46.0)
Hemoglobin: 14.1 g/dL (ref 12.0–15.0)
MCH: 29.7 pg (ref 26.0–34.0)
MCHC: 32.6 g/dL (ref 30.0–36.0)
MCV: 91.1 fL (ref 80.0–100.0)
Platelets: 249 10*3/uL (ref 150–400)
RBC: 4.74 MIL/uL (ref 3.87–5.11)
RDW: 13.7 % (ref 11.5–15.5)
WBC: 10 10*3/uL (ref 4.0–10.5)
nRBC: 0 % (ref 0.0–0.2)

## 2019-12-08 LAB — HCG, QUANTITATIVE, PREGNANCY: hCG, Beta Chain, Quant, S: 1571 m[IU]/mL — ABNORMAL HIGH (ref <5)

## 2019-12-08 NOTE — MAU Note (Signed)
[name] is a 39 y.o. at [redacted]w[redacted]d here in [facility] reporting: vaginal bleeding and cramping since Friday. States now bleeding is brown. Is wearing a panty liner, is not changing often. Has had a tubal, was here yesterday but left before she was seen.  Onset of complaint: ongoing  Pain score: 3/10  Vitals:   12/08/19 0848  BP: 129/72  Pulse: 95  Resp: 16  Temp: 98.1 F (36.7 C)  SpO2: 100%     Lab orders placed from triage: UA

## 2019-12-08 NOTE — MAU Provider Note (Signed)
History     CSN: [id number]  Arrival date and time: 12/08/19 0354   First Provider Initiated Contact with Patient 12/08/19 [phone]      Chief Complaint  Patient presents with   Abdominal Pain   Vaginal Bleeding   Abdominal Pain This is a new problem. Episode onset: Friday. The onset quality is sudden. The problem occurs intermittently. The problem has been gradually worsening. The pain is located in the suprapubic region. The pain is at a severity of 5/10. The pain is moderate. The quality of the pain is dull, aching and cramping. The abdominal pain does not radiate. Associated symptoms include headaches (reports chronic), nausea and vomiting. Pertinent negatives include no constipation, diarrhea or dysuria. Associated symptoms comments: Vaginal bleeding (was bright red, now dark brown). Nothing aggravates the pain. The pain is relieved by nothing. She has tried nothing for the symptoms. BTL (clamp/burning) 13 years ago  Vaginal Bleeding The patient's primary symptoms include vaginal bleeding. This is a new problem. Episode onset: Friday. The problem occurs constantly. Progression since onset: changed from bright red to dark brown/black. The pain is moderate. The problem affects both sides. She is pregnant. Associated symptoms include abdominal pain, headaches (reports chronic), nausea and vomiting. Pertinent negatives include no constipation, diarrhea or dysuria. The vaginal discharge was brown. The vaginal bleeding is heavier than menses. She has not been passing clots. She has not been passing tissue. Nothing aggravates the symptoms. She has tried nothing for the symptoms. She is sexually active (last intercourse was Thursday). No, her partner does not have an STD. She uses tubal ligation for contraception. Her menstrual history has been regular. (BTL (clamp/burning) 13 years ago)    OB History    Gravida  3   Para  2   Term  1   Preterm  1   AB      Living  2     SAB      TAB       Ectopic      Multiple      Live Births  2           Past Medical History:  Diagnosis Date   Anxiety    Arthritis    Depression    Hx of tubal ligation 2008   Hypertension     Past Surgical History:  Procedure Laterality Date   TUBAL LIGATION      Family History  Problem Relation Age of Onset   Depression Mother    Stroke Mother    Diabetes Father    Hypertension Father    Stroke Father    Heart attack Father 54   Hypertension Sister    Hypertension Brother    Heart attack Brother 50       x3 MI (twin bro)   CAD Brother    Asthma Daughter    Breast cancer Paternal Grandmother 72   Hypertension Brother    Healthy Daughter     Social History   Tobacco Use   Smoking status: Current Every Day Smoker    Packs/day: 1.00    Years: 5.00    Pack years: 5.00    Types: Cigarettes    Start date: 05/29/2006   Smokeless tobacco: Never Used  Vaping Use   Vaping Use: Former  Substance Use Topics   Alcohol use: Yes    Comment: occasional   Drug use: No    Allergies:  Allergies  Allergen Reactions   Penicillins    Sulfa  Antibiotics     Medications Prior to Admission  Medication Sig Dispense Refill Last Dose   amLODipine (NORVASC) 10 MG tablet Take 1 tablet (10 mg total) by mouth daily. (Needs to be seen before next refill) 30 tablet 0    escitalopram (LEXAPRO) 20 MG tablet Take 1 tablet (20 mg total) by mouth daily. 30 tablet 5    famotidine (PEPCID) 20 MG tablet Take 20 mg by mouth daily.       Review of Systems  Constitutional: Negative.   HENT: Negative.   Eyes: Negative.   Respiratory: Negative.   Cardiovascular: Negative.   Gastrointestinal: Positive for abdominal pain, nausea and vomiting. Negative for constipation and diarrhea.  Endocrine: Negative.   Genitourinary: Positive for vaginal bleeding. Negative for dysuria.  Musculoskeletal: Negative.   Skin: Negative.   Allergic/Immunologic: Negative.    Neurological: Positive for headaches (reports chronic).  Hematological: Negative.   Psychiatric/Behavioral: Negative.    Physical Exam   Blood pressure 129/72, pulse 95, temperature 98.1 F (36.7 C), temperature source Oral, resp. rate 16, height 5\' 3"  (1.6 m), weight 87.7 kg, last menstrual period 10/12/2019, SpO2 100 %.  Physical Exam Vitals and nursing note reviewed.  Constitutional:      Appearance: She is well-developed. She is obese.  HENT:     Head: Normocephalic and atraumatic.     Mouth/Throat:     Mouth: Mucous membranes are moist.     Pharynx: Oropharynx is clear.  Eyes:     Extraocular Movements: Extraocular movements intact.     Pupils: Pupils are equal, round, and reactive to light.  Cardiovascular:     Rate and Rhythm: Normal rate and regular rhythm.     Heart sounds: Normal heart sounds.  Pulmonary:     Effort: Pulmonary effort is normal.     Breath sounds: Normal breath sounds.  Abdominal:     General: Abdomen is protuberant. There is no distension or abdominal bruit. There are no signs of injury.     Palpations: Abdomen is soft.     Tenderness: There is abdominal tenderness in the suprapubic area. There is rebound. There is no guarding.     Hernia: No hernia is present.  Skin:    General: Skin is warm and dry.  Neurological:     General: No focal deficit present.     Mental Status: She is alert.  Psychiatric:        Mood and Affect: Mood normal.        Behavior: Behavior normal.     MAU Course  Procedures  MDM - HCG quant 1127 on 7/6 - r/o ectopic: 9/6, CBC, Rhogam eval, T&S, HCG quant  Results for orders placed or performed during the hospital encounter of 12/08/19 (from the past 24 hour(s))  Urinalysis, Routine w reflex microscopic     Status: Abnormal   Collection Time: 12/08/19  9:50 AM  Result Value Ref Range   Color, Urine YELLOW YELLOW   APPearance HAZY (A) CLEAR   Specific Gravity, Urine 1.009 1.005 - 1.030   pH 6.0 5.0 - 8.0    Glucose, UA NEGATIVE NEGATIVE mg/dL   Hgb urine dipstick LARGE (A) NEGATIVE   Bilirubin Urine NEGATIVE NEGATIVE   Ketones, ur NEGATIVE NEGATIVE mg/dL   Protein, ur NEGATIVE NEGATIVE mg/dL   Nitrite NEGATIVE NEGATIVE   Leukocytes,Ua MODERATE (A) NEGATIVE   RBC / HPF 0-5 0 - 5 RBC/hpf   WBC, UA 6-10 0 - 5 WBC/hpf   Bacteria, UA  FEW (A) NONE SEEN   Squamous Epithelial / LPF 11-20 0 - 5   Mucus PRESENT   CBC     Status: None   Collection Time: 12/08/19 10:00 AM  Result Value Ref Range   WBC 10.0 4.0 - 10.5 K/uL   RBC 4.74 3.87 - 5.11 MIL/uL   Hemoglobin 14.1 12.0 - 15.0 g/dL   HCT 62.2 36 - 46 %   MCV 91.1 80.0 - 100.0 fL   MCH 29.7 26.0 - 34.0 pg   MCHC 32.6 30.0 - 36.0 g/dL   RDW 63.3 35.4 - 56.2 %   Platelets 249 150 - 400 K/uL   nRBC 0.0 0.0 - 0.2 %  Type and screen [facility]     Status: None   Collection Time: 12/08/19 10:00 AM  Result Value Ref Range   ABO/RH(D) O POS    Antibody Screen NEG    Sample Expiration      12/11/2019,2359 Performed at [facility], [address] [place], [place] [address]   hCG, quantitative, pregnancy     Status: Abnormal   Collection Time: 12/08/19 10:26 AM  Result Value Ref Range   hCG, Beta Chain, Quant, S 1,571 (H) <5 mIU/mL  ABO/Rh     Status: None (Preliminary result)   Collection Time: 12/08/19 11:19 AM  Result Value Ref Range   ABO/RH(D) PENDING    US OB Transvaginal  Result Date: 12/08/2019 CLINICAL DATA:  Rule out ectopic pregnancy. History of tubal ligation. EXAM: TRANSVAGINAL OB ULTRASOUND TECHNIQUE: Transvaginal ultrasound was performed for complete evaluation of the gestation as well as the maternal uterus, adnexal regions, and pelvic cul-de-sac. COMPARISON:  Three days ago FINDINGS: No intra or extra uterine gestational sac is seen. No adnexal mass or ovarian enlargement. No visible pelvic fluid. IMPRESSION: Pregnancy of unknown location with negative pelvic ultrasound. Electronically Signed   By:  Marnee Spring M.D.   On: 12/08/2019 10:52     Assessment and Plan  39 yo G3P1102 at 8.1 EGA with pregnancy of unknown location - No IUP or ectopic seen on [place], no fluid in pelvis - DC to home in stable condition - Follow up quant in the office on Friday at 0830 - Strict return precautions given  [name] L Cyndel Griffey 12/08/2019, 12:06 PM

## 2019-12-08 NOTE — Discharge Instructions (Signed)
Ectopic Pregnancy ° °An ectopic pregnancy happens when a fertilized egg grows outside the womb (uterus). The fertilized egg cannot stay alive outside of the womb. This problem often happens in a fallopian tube. It is often caused by damage to the tube. °If this problem is found early, you may be treated with medicine that stops the egg from growing. If your tube tears or bursts open (ruptures), you will bleed inside. Often, there is very bad pain in the lower belly. This is an emergency. You will need surgery. Get help right away. °Follow these instructions at home: °After being treated with medicine or surgery: °· Rest and limit your activity for as long as told by your doctor. °· Until your doctor says that it is safe: °? Do not lift anything that is heavier than 10 lb (4.5 kg) or the limit that your doctor tells you. °? Avoid exercise and any movement that takes a lot of effort. °· To prevent problems when pooping (constipation): °? Eat a healthy diet. This includes: °§ Fruits. °§ Vegetables. °§ Whole grains. °? Drink 6-8 glasses of water a day. °Contact a doctor if: °Get help right away if: °· You have sudden and very bad pain in your belly. °· You have very bad pain in your shoulders or neck. °· You have pain that gets worse and is not helped by medicine. °· You have: °? A fever or chills. °? Vaginal bleeding. °? Redness or swelling at the site of a surgical cut (incision). °· You feel sick to your stomach (nauseous) or you throw up (vomit). °· You feel dizzy or weak. °· You feel light-headed or you pass out (faint). °Summary °· An ectopic pregnancy happens when a fertilized egg grows outside the womb (uterus). °· If this problem is found early, you may be treated with medicine that stops the egg from growing. °· If your tube tears or bursts open (ruptures), you will need surgery. This is an emergency. Get help right away. °This information is not intended to replace advice given to you by your health care  provider. Make sure you discuss any questions you have with your health care provider. °Document Revised: 05/02/2017 Document Reviewed: 06/13/2016 °Elsevier Patient Education © 2020 Elsevier Inc. ° °

## 2019-12-09 ENCOUNTER — Other Ambulatory Visit: Payer: Self-pay

## 2019-12-09 ENCOUNTER — Emergency Department (HOSPITAL_COMMUNITY)
Admission: EM | Admit: 2019-12-09 | Discharge: 2019-12-09 | Disposition: A | Discharge status: Home / Self Care | Payer: Self-pay | Attending: Emergency Medicine | Admitting: Emergency Medicine

## 2019-12-09 ENCOUNTER — Encounter (HOSPITAL_COMMUNITY): Payer: Self-pay | Admitting: *Deleted

## 2019-12-09 DIAGNOSIS — N3001 Acute cystitis with hematuria: Secondary | ICD-10-CM | POA: Insufficient documentation

## 2019-12-09 DIAGNOSIS — Z79899 Other long term (current) drug therapy: Secondary | ICD-10-CM | POA: Insufficient documentation

## 2019-12-09 DIAGNOSIS — O209 Hemorrhage in early pregnancy, unspecified: Secondary | ICD-10-CM | POA: Insufficient documentation

## 2019-12-09 DIAGNOSIS — I10 Essential (primary) hypertension: Secondary | ICD-10-CM | POA: Insufficient documentation

## 2019-12-09 DIAGNOSIS — O99331 Smoking (tobacco) complicating pregnancy, first trimester: Secondary | ICD-10-CM | POA: Insufficient documentation

## 2019-12-09 DIAGNOSIS — F17210 Nicotine dependence, cigarettes, uncomplicated: Secondary | ICD-10-CM | POA: Insufficient documentation

## 2019-12-09 DIAGNOSIS — O3680X0 Pregnancy with inconclusive fetal viability, not applicable or unspecified: Secondary | ICD-10-CM

## 2019-12-09 DIAGNOSIS — Z3A01 Less than 8 weeks gestation of pregnancy: Secondary | ICD-10-CM | POA: Insufficient documentation

## 2019-12-09 DIAGNOSIS — O4690 Antepartum hemorrhage, unspecified, unspecified trimester: Secondary | ICD-10-CM

## 2019-12-09 LAB — URINALYSIS, ROUTINE W REFLEX MICROSCOPIC
Bilirubin Urine: NEGATIVE
Glucose, UA: NEGATIVE mg/dL
Ketones, ur: 20 mg/dL — AB
Nitrite: NEGATIVE
Protein, ur: 30 mg/dL — AB
RBC / HPF: >50 RBC/hpf — ABNORMAL HIGH (ref 0–5)
Specific Gravity, Urine: 1.012 (ref 1.005–1.030)
pH: 5 (ref 5.0–8.0)

## 2019-12-09 LAB — CBC WITH DIFFERENTIAL/PLATELET
Abs Immature Granulocytes: 0.08 10*3/uL — ABNORMAL HIGH (ref 0.00–0.07)
Basophils Absolute: 0.1 10*3/uL (ref 0.0–0.1)
Basophils Relative: 0 %
Eosinophils Absolute: 0.1 10*3/uL (ref 0.0–0.5)
Eosinophils Relative: 0 %
HCT: 45.3 % (ref 36.0–46.0)
Hemoglobin: 15.1 g/dL — ABNORMAL HIGH (ref 12.0–15.0)
Immature Granulocytes: 1 %
Lymphocytes Relative: 7 %
Lymphs Abs: 1.1 10*3/uL (ref 0.7–4.0)
MCH: 31 pg (ref 26.0–34.0)
MCHC: 33.3 g/dL (ref 30.0–36.0)
MCV: 93 fL (ref 80.0–100.0)
Monocytes Absolute: 0.6 10*3/uL (ref 0.1–1.0)
Monocytes Relative: 4 %
Neutro Abs: 15.7 10*3/uL — ABNORMAL HIGH (ref 1.7–7.7)
Neutrophils Relative %: 88 %
Platelets: 278 10*3/uL (ref 150–400)
RBC: 4.87 MIL/uL (ref 3.87–5.11)
RDW: 13.7 % (ref 11.5–15.5)
WBC: 17.6 10*3/uL — ABNORMAL HIGH (ref 4.0–10.5)
nRBC: 0 % (ref 0.0–0.2)

## 2019-12-09 LAB — BASIC METABOLIC PANEL
Anion gap: 10 (ref 5–15)
BUN: 11 mg/dL (ref 6–20)
CO2: 21 mmol/L — ABNORMAL LOW (ref 22–32)
Calcium: 9 mg/dL (ref 8.9–10.3)
Chloride: 106 mmol/L (ref 98–111)
Creatinine, Ser: 0.76 mg/dL (ref 0.44–1.00)
GFR calc Af Amer: >60 mL/min (ref >60)
GFR calc non Af Amer: >60 mL/min (ref >60)
Glucose, Bld: 108 mg/dL — ABNORMAL HIGH (ref 70–99)
Potassium: 3.6 mmol/L (ref 3.5–5.1)
Sodium: 137 mmol/L (ref 135–145)

## 2019-12-09 LAB — ABO/RH: ABO/RH(D): O POS

## 2019-12-09 MED ORDER — CEPHALEXIN 500 MG PO CAPS: 500.0000 mg | ORAL_CAPSULE | Freq: Two times a day (BID) | ORAL | 0 refills | Status: AC

## 2019-12-09 NOTE — ED Triage Notes (Signed)
Pt states she was seen earlier this week for same complaint; pt states she had a tubal ligation x 13 years ago but continues to have vaginal bleeding; pt states she has had an abdominal US and was told they could not find a yolk sac or heart beat but pt states she is having heavy vaginal bleeding with clots and cramping

## 2019-12-09 NOTE — ED Provider Notes (Signed)
[facility] EMERGENCY DEPARTMENT Provider Note   CSN: [id number] Arrival date & time: 12/09/19  1329     History Chief Complaint  Patient presents with  . Vaginal Bleeding    [name] is a 39 y.o. female who is currently 13 years out from a bilateral tubal ligation who recently discovered she was pregnant by home pregnancy test, LMP was 10/12/2019 presenting with increased lower pelvic cramping and bleeding.  She was seen by women's [facility] yesterday during which time an ultrasound was performed and they were unable to detect an intrauterine or pelvic pregnancy.  Her quantitative hCGs have increased on July 4 it was 871, yesterday this number was 1571, but neither number would be consistent with an expected 8-week pregnancy given date of her LMP.  She is scheduled to be seen in the women's clinic tomorrow morning for repeat quantitative testing, but presents here as she was told to get rechecked for any increased bleeding or pain.  She was simply spotting a brown discharge yesterday, and today it has been more like a light period, has only had to change her pad once today.  She denies fevers or chills, no dysuria, no other complaints although she endorses her anxiety has been increased once learning she was pregnant.  She did not sleep well last night secondary to anxiety.  She saw her PCP yesterday as well secondary to this issue.  Is currently on Lexapro for anxiety.  The history is provided by the patient.       Past Medical History:  Diagnosis Date  . Anxiety   . Arthritis   . Depression   . Hx of tubal ligation 2008  . Hypertension     Patient Active Problem List   Diagnosis Date Noted  . Mixed hyperlipidemia 10/28/2018  . Essential hypertension 05/09/2017  . Family history of early CAD 05/09/2017  . Atypical chest pain 05/09/2017  . Abnormal menstrual periods 05/09/2017  . Anxiety state 05/09/2017  . Tobacco use disorder 05/09/2017    Past Surgical History:  Procedure  Laterality Date  . TUBAL LIGATION       OB History    Gravida  3   Para  2   Term  1   Preterm  1   AB      Living  2     SAB      TAB      Ectopic      Multiple      Live Births  2           Family History  Problem Relation Age of Onset  . Depression Mother   . Stroke Mother   . Diabetes Father   . Hypertension Father   . Stroke Father   . Heart attack Father 30  . Hypertension Sister   . Hypertension Brother   . Heart attack Brother 27       x3 MI (twin bro)  . CAD Brother   . Asthma Daughter   . Breast cancer Paternal Grandmother 9  . Hypertension Brother   . Healthy Daughter     Social History   Tobacco Use  . Smoking status: Current Every Day Smoker    Packs/day: 1.00    Years: 5.00    Pack years: 5.00    Types: Cigarettes    Start date: 05/29/2006  . Smokeless tobacco: Never Used  Vaping Use  . Vaping Use: Former  Substance Use Topics  . Alcohol use:  Yes    Comment: occasional  . Drug use: No    Home Medications Prior to Admission medications   Medication Sig Start Date End Date Taking? Authorizing Provider  amLODipine (NORVASC) 10 MG tablet Take 1 tablet (10 mg total) by mouth daily. (Needs to be seen before next refill) 11/22/19  Yes Gottschalk, Ashly M, DO  diphenhydramine-acetaminophen (TYLENOL PM) 25-500 MG TABS tablet Take 1 tablet by mouth at bedtime as needed.   Yes [provider]  escitalopram (LEXAPRO) 20 MG tablet Take 1 tablet (20 mg total) by mouth daily. 12/07/19  Yes Gottschalk, Ashly M, DO  cephALEXin (KEFLEX) 500 MG capsule Take 1 capsule (500 mg total) by mouth 2 (two) times daily for 7 days. 12/09/19 12/16/19  Burgess Amor, PA-C  famotidine (PEPCID) 20 MG tablet Take 20 mg by mouth daily. Patient not taking: Reported on 12/09/2019    [provider]    Allergies    Penicillins and Sulfa antibiotics  Review of Systems   Review of Systems  Constitutional: Negative for fever.  HENT: Negative for  congestion and sore throat.   Eyes: Negative.   Respiratory: Negative for chest tightness and shortness of breath.   Cardiovascular: Negative for chest pain.  Gastrointestinal: Negative for abdominal pain and nausea.  Genitourinary: Positive for pelvic pain and vaginal bleeding. Negative for dysuria and vaginal discharge.  Musculoskeletal: Negative for arthralgias, joint swelling and neck pain.  Skin: Negative.  Negative for rash and wound.  Neurological: Negative for dizziness, weakness, light-headedness, numbness and headaches.  Psychiatric/Behavioral: Negative.     Physical Exam Updated Vital Signs BP (!) 134/100 (BP Location: Right Arm)   Pulse 93   Temp 98.1 F (36.7 C)   Resp 16   Ht 5\' 3"  (1.6 m)   Wt 87.7 kg   LMP 10/12/2019   SpO2 99%   BMI 34.25 kg/m   Physical Exam Vitals and nursing note reviewed. Exam conducted with a chaperone present.  Constitutional:      Appearance: Normal appearance. She is well-developed.  HENT:     Head: Normocephalic and atraumatic.  Eyes:     Conjunctiva/sclera: Conjunctivae normal.  Cardiovascular:     Rate and Rhythm: Regular rhythm. Tachycardia present.     Heart sounds: Normal heart sounds.  Pulmonary:     Effort: Pulmonary effort is normal.     Breath sounds: Normal breath sounds. No wheezing.  Abdominal:     General: Bowel sounds are normal.     Palpations: Abdomen is soft.     Tenderness: There is abdominal tenderness in the suprapubic area. There is no guarding or rebound.     Comments: Mild tenderness suprapubic region without guarding.  Genitourinary:    Vagina: Bleeding present.     Cervix: No cervical motion tenderness.     Uterus: Normal. Not tender.      Adnexa: Right adnexa normal.     Comments: No cmt tenderness. Small amount of blood in vagina, no clots.  Musculoskeletal:        General: Normal range of motion.     Cervical back: Normal range of motion.  Skin:    General: Skin is warm and dry.    Neurological:     Mental Status: She is alert.     ED Results / Procedures / Treatments   Labs (all labs ordered are listed, but only abnormal results are displayed) Labs Reviewed  CBC WITH DIFFERENTIAL/PLATELET - Abnormal; Notable for the following components:  Result Value   WBC 17.6 (*)    Hemoglobin 15.1 (*)    Neutro Abs 15.7 (*)    Abs Immature Granulocytes 0.08 (*)    All other components within normal limits  BASIC METABOLIC PANEL - Abnormal; Notable for the following components:   CO2 21 (*)    Glucose, Bld 108 (*)    All other components within normal limits  URINALYSIS, ROUTINE W REFLEX MICROSCOPIC    EKG None  Radiology US OB Transvaginal  Result Date: 12/08/2019 CLINICAL DATA:  Rule out ectopic pregnancy. History of tubal ligation. EXAM: TRANSVAGINAL OB ULTRASOUND TECHNIQUE: Transvaginal ultrasound was performed for complete evaluation of the gestation as well as the maternal uterus, adnexal regions, and pelvic cul-de-sac. COMPARISON:  Three days ago FINDINGS: No intra or extra uterine gestational sac is seen. No adnexal mass or ovarian enlargement. No visible pelvic fluid. IMPRESSION: Pregnancy of unknown location with negative pelvic ultrasound. Electronically Signed   By: Marnee Spring M.D.   On: 12/08/2019 10:52    Procedures Procedures (including critical care time)  Medications Ordered in ED Medications - No data to display  ED Course  I have reviewed the triage vital signs and the nursing notes.  Pertinent labs & imaging results that were available during my care of the patient were reviewed by me and considered in my medical decision making (see chart for details).    MDM Rules/Calculators/A&P                          Call to Dr. Donavan Foil with [facility]U, labs and imaging reviewed from yesterdays visit in comparison to today. Yesterday she did have some wbc's in her urine, few bacteria.  Will repeat UA today including urine culture.  Would plan short of  abx and keep appt in am with the [facility] clinic as planned for repeat bhcg.  Pt understands and is agreeable with plan.  PCN allergic, but has had keflex previously without problems.   Of note, after discussing yesterdays UA results, pt states she has had increased urinary frequency, but no dysuria.  Final Clinical Impression(s) / ED Diagnoses Final diagnoses:  Vaginal bleeding in pregnancy  Pregnancy of unknown anatomic location  Acute cystitis with hematuria    Rx / DC Orders ED Discharge Orders         Ordered    cephALEXin (KEFLEX) 500 MG capsule  2 times daily     Discontinue  Reprint     12/09/19 1651           Burgess Amor, PA-C 12/09/19 1653    Eber Hong, MD 12/10/19 [phone]

## 2019-12-09 NOTE — Discharge Instructions (Addendum)
Return to [facility] tomorrow as originally planned for further blood work as discussed.  You have been prescribed an antibiotic to help with the urinary frequency and suspected urinary infection.  Make sure you are drinking plenty of fluids.  It would be okay to take a dose of Benadryl tonight if it will help you sleep better.

## 2019-12-10 ENCOUNTER — Ambulatory Visit (INDEPENDENT_AMBULATORY_CARE_PROVIDER_SITE_OTHER): Payer: Self-pay | Admitting: *Deleted

## 2019-12-10 ENCOUNTER — Other Ambulatory Visit: Payer: Self-pay

## 2019-12-10 ENCOUNTER — Encounter (HOSPITAL_COMMUNITY): Payer: Self-pay | Admitting: Obstetrics and Gynecology

## 2019-12-10 ENCOUNTER — Encounter: Payer: Self-pay | Admitting: Obstetrics & Gynecology

## 2019-12-10 ENCOUNTER — Inpatient Hospital Stay (HOSPITAL_COMMUNITY)
Admission: AD | Admit: 2019-12-10 | Discharge: 2019-12-10 | Disposition: A | Discharge status: Home / Self Care | Payer: Self-pay | Attending: Obstetrics and Gynecology | Admitting: Obstetrics and Gynecology

## 2019-12-10 VITALS — BP 137/86 | HR 90 | Ht 63.0 in | Wt 193.0 lb

## 2019-12-10 DIAGNOSIS — Z5181 Encounter for therapeutic drug level monitoring: Secondary | ICD-10-CM

## 2019-12-10 DIAGNOSIS — O3680X0 Pregnancy with inconclusive fetal viability, not applicable or unspecified: Secondary | ICD-10-CM

## 2019-12-10 DIAGNOSIS — O009 Unspecified ectopic pregnancy without intrauterine pregnancy: Secondary | ICD-10-CM | POA: Insufficient documentation

## 2019-12-10 DIAGNOSIS — Z3A08 8 weeks gestation of pregnancy: Secondary | ICD-10-CM | POA: Insufficient documentation

## 2019-12-10 HISTORY — DX: Unspecified infectious disease: B99.9

## 2019-12-10 HISTORY — DX: Endometriosis, unspecified: N80.9

## 2019-12-10 HISTORY — DX: Headache, unspecified: R51.9

## 2019-12-10 LAB — COMPREHENSIVE METABOLIC PANEL
ALT: 10 U/L (ref 0–44)
AST: 12 U/L — ABNORMAL LOW (ref 15–41)
Albumin: 3.9 g/dL (ref 3.5–5.0)
Alkaline Phosphatase: 57 U/L (ref 38–126)
Anion gap: 11 (ref 5–15)
BUN: 8 mg/dL (ref 6–20)
CO2: 21 mmol/L — ABNORMAL LOW (ref 22–32)
Calcium: 9.1 mg/dL (ref 8.9–10.3)
Chloride: 106 mmol/L (ref 98–111)
Creatinine, Ser: 0.8 mg/dL (ref 0.44–1.00)
GFR calc Af Amer: >60 mL/min (ref >60)
GFR calc non Af Amer: >60 mL/min (ref >60)
Glucose, Bld: 99 mg/dL (ref 70–99)
Potassium: 3.9 mmol/L (ref 3.5–5.1)
Sodium: 138 mmol/L (ref 135–145)
Total Bilirubin: 0.4 mg/dL (ref 0.3–1.2)
Total Protein: 6.8 g/dL (ref 6.5–8.1)

## 2019-12-10 LAB — CBC
HCT: 42.5 % (ref 36.0–46.0)
Hemoglobin: 14 g/dL (ref 12.0–15.0)
MCH: 30.1 pg (ref 26.0–34.0)
MCHC: 32.9 g/dL (ref 30.0–36.0)
MCV: 91.4 fL (ref 80.0–100.0)
Platelets: 243 10*3/uL (ref 150–400)
RBC: 4.65 MIL/uL (ref 3.87–5.11)
RDW: 13.7 % (ref 11.5–15.5)
WBC: 13.1 10*3/uL — ABNORMAL HIGH (ref 4.0–10.5)
nRBC: 0 % (ref 0.0–0.2)

## 2019-12-10 LAB — HCG, QUANTITATIVE, PREGNANCY: hCG, Beta Chain, Quant, S: 1203 m[IU]/mL — ABNORMAL HIGH (ref <5)

## 2019-12-10 LAB — BETA HCG QUANT (REF LAB): hCG Quant: 1218 m[IU]/mL

## 2019-12-10 MED ORDER — METHOTREXATE FOR ECTOPIC PREGNANCY
50.0000 mg/m2 | Freq: Once | INTRAMUSCULAR | Status: AC
  Administered 2019-12-10: 100 mg via INTRAMUSCULAR
  Filled 2019-12-10: qty 1

## 2019-12-10 NOTE — MAU Note (Signed)
Sent in for methotrexate for suspected ectopic pregnancy.  Having cramping and bleeding.  Nothing seen on [place], hx of tubal ligation. HCG level has dropped.

## 2019-12-10 NOTE — Progress Notes (Signed)
Stat BHCG drawn earlier this morning. Pt reported she was still having abdominal cramping like a period and some bleeding with small clots. She was advised that she would be called with results and plan of care today. She voiced understanding and stated that a detailed message could be left on her VM. BHCG results were received and discussed with Dr. Debroah Loop who finds inappropriate rise. He discussed pt history with Dr. Donavan Foil and recommended that pt go to [facility]U to receive MTX today. I called pt and left detailed message on her personal VM stating results and plan of care. Nursing report was called to [facility] @ [facility].

## 2019-12-10 NOTE — Discharge Instructions (Signed)
Methotrexate Treatment for an Ectopic Pregnancy, Care After This sheet gives you information about how to care for yourself after your procedure. Your health care provider may also give you more specific instructions. If you have problems or questions, contact your health care provider. What can I expect after the procedure? After the procedure, it is common to have:  Abdominal cramping.  Vaginal bleeding.  Fatigue.  Nausea.  Vomiting.  Diarrhea. Blood tests will be taken at timed intervals for several days or weeks to check your pregnancy hormone levels. The blood tests will be done until the pregnancy hormone can no longer be detected in the blood. Follow these instructions at home: Activity  Do not have sex until your health care provider approves.  Limit activities that take a lot of effort as told by your health care provider. Medicines  Take over the counter and prescription medicines only as told by your health care provider.  Do not take aspirin, ibuprofen, naproxen, or any other NSAIDs.  Do not take folic acid, prenatal vitamins, or other vitamins that contain folic acid. General instructions   Do not drink alcohol.  Follow instructions from your health care provider on how and when to report any symptoms that may indicate a ruptured ectopic pregnancy.  Keep all follow-up visits as told by your health care provider. This is important. Contact a health care provider if:  You have persistent nausea and vomiting.  You have persistent diarrhea.  You are having a reaction to the medicine, such as: ? Tiredness. ? Skin rash. ? Hair loss. Get help right away if:  Your abdominal or pelvic pain gets worse.  You have more vaginal bleeding.  You feel light-headed or you faint.  You have shortness of breath.  Your heart rate increases.  You develop a cough.  You have chills.  You have a fever. Summary  After the procedure, it is common to have symptoms  of abdominal cramping, vaginal bleeding and fatigue. You may also experience other symptoms.  Blood tests will be taken at timed intervals for several days or weeks to check your pregnancy hormone levels. The blood tests will be done until the pregnancy hormone can no longer be detected in the blood.  Limit strenuous activity as told by your health care provider.  Follow instructions from your health care provider on how and when to report any symptoms that may indicate a ruptured ectopic pregnancy. This information is not intended to replace advice given to you by your health care provider. Make sure you discuss any questions you have with your health care provider. Document Revised: 05/02/2017 Document Reviewed: 07/09/2016 Elsevier Patient Education  2020 [employer]vier Inc.  

## 2019-12-10 NOTE — MAU Note (Signed)
MTX injection was given in left thigh, due to scarring on rt- pt choice

## 2019-12-10 NOTE — MAU Provider Note (Signed)
Patient [name] is a 39 y.o. [id number] At [redacted]w[redacted]d here for methotrexate injection after inappropriate rise in quant. Patient sent from clinic after consults between Dr. Debroah Loop and Dr. Donavan Foil.  Patient reports light cramping and bleeding; no distress. Is asking if she can consume alcohol this weekend.   [place] with nothing in the uterus on 07/07; no IUP, no adnexal mass.   Quant as follows:  07/06: 2707 86/75: 4492 01/00: 1218  CBC and CMP stable, renal and LFTs normal.   Patient blood type is O positive.   1. Encounter for monitoring of methotrexate therapy    2. Patient stable for discharge with appt on Tuesday, July 13 for stat [facility] (patient cannot come on Monday, July 12). Patient understands she will need further monitoring on Thursday, July 15 to make sure that bHCG. Is dropping.   3. Strict ectopic precautions reviewed, including medications to avoid and drinking alcohol; all questions answered.    Charlesetta Garibaldi Skarlet Lyons 12/10/2019, 6:04 PM

## 2019-12-14 ENCOUNTER — Ambulatory Visit (INDEPENDENT_AMBULATORY_CARE_PROVIDER_SITE_OTHER): Payer: Self-pay | Admitting: *Deleted

## 2019-12-14 ENCOUNTER — Other Ambulatory Visit: Payer: Self-pay

## 2019-12-14 VITALS — BP 134/93 | HR 98 | Ht 63.0 in | Wt 191.0 lb

## 2019-12-14 DIAGNOSIS — Z79899 Other long term (current) drug therapy: Secondary | ICD-10-CM

## 2019-12-14 DIAGNOSIS — O208 Other hemorrhage in early pregnancy: Secondary | ICD-10-CM

## 2019-12-14 DIAGNOSIS — Z5181 Encounter for therapeutic drug level monitoring: Secondary | ICD-10-CM

## 2019-12-14 LAB — BETA HCG QUANT (REF LAB): hCG Quant: 278 m[IU]/mL

## 2019-12-14 NOTE — Progress Notes (Signed)
Pt presents for stat BHCG on Dhyana Bastone #5 following MTX. She had stated while @ [facility] on 7/9 that she was not available for visit yesterday. She reports that after receiving MTX she had some heavier bleeding with clots. Today the bleeding is much less and she is down to using one pad/Oluwatomisin Hustead. She had increased amount of upper and lower abdomen pain after MTX, however today her pain is less - pain scale 4. Lab drawn and pt will be notified of results later today. Pt understands that she will also need appt on 7/15 in office for BHCG - Paul Torpey #7. Pt expressed concern regarding swelling of Rt side of her face. She feels this is a tooth abscess and requested Rx for antibiotic. Per chart review, pt has 2 days left on Keflex which was given on 7/8 to treat UTI. Pt was advised that we would not prescribe additional antibiotic at this point in time. I instructed her to use warm saline mouth rinses for 3-4 times daily. Also, she should call her dentist and schedule appointment. Pt voiced understanding of all information and instructions given.   1330 Results reviewed with Luna Kitchens, CNM who finds appropriate decrease in hormone level. Pt should keep appt as scheduled on 7/15 for repeat BHCG Gwynn Chalker #7 after MTX.  1700 Called pt to discuss results and plan of care for follow up on 7/15. She advised that she has seen the results via MyChart. Pt will keep appt as scheduled on 7/15. She was also advised that she will likely have appt with a provider in about 2-3 weeks for follow up. She agreed and voiced understanding.

## 2019-12-15 NOTE — Progress Notes (Signed)
Chart reviewed for nurse visit. Agree with plan of care.   Marylene Eddington, CNM 12/15/2019 9:00 PM

## 2019-12-15 NOTE — Progress Notes (Signed)
Patient ID: [name], female   DOB: Sep 11, 1980, 39 y.o.   MRN: [id number] Patient was assessed and managed by nursing staff during this encounter. I have reviewed the chart and agree with the documentation and plan. I have also made any necessary editorial changes.  Scheryl Darter, MD 12/15/2019 6:12 PM

## 2019-12-16 ENCOUNTER — Other Ambulatory Visit: Payer: Self-pay

## 2019-12-16 ENCOUNTER — Ambulatory Visit (INDEPENDENT_AMBULATORY_CARE_PROVIDER_SITE_OTHER): Payer: Self-pay | Admitting: Lactation Services

## 2019-12-16 DIAGNOSIS — Z09 Encounter for follow-up examination after completed treatment for conditions other than malignant neoplasm: Secondary | ICD-10-CM

## 2019-12-16 DIAGNOSIS — Z79899 Other long term (current) drug therapy: Secondary | ICD-10-CM

## 2019-12-16 DIAGNOSIS — R220 Localized swelling, mass and lump, head: Secondary | ICD-10-CM

## 2019-12-16 LAB — BETA HCG QUANT (REF LAB): hCG Quant: 130 m[IU]/mL

## 2019-12-16 NOTE — Progress Notes (Addendum)
Patient her for stat Beta. She reports her bleeding and cramping has stopped. Patient asked if she can have wine. Informed her she can. Beta is decreasing. Patient aware we will call her for results and recommendation for follow up also.   Called patient at 4:50 to give her results of 130 with follow up non stat Beta on 7/22 @ 10:30. Patient voiced understanding. Patient aware she also has an appointment on 8/5 with a provider.

## 2019-12-20 ENCOUNTER — Other Ambulatory Visit: Payer: Self-pay | Admitting: Family Medicine

## 2019-12-20 DIAGNOSIS — I10 Essential (primary) hypertension: Secondary | ICD-10-CM

## 2019-12-20 MED ORDER — AMLODIPINE BESYLATE 10 MG PO TABS: 10.0000 mg | ORAL_TABLET | Freq: Every day | ORAL | 0 refills | Status: DC

## 2019-12-23 ENCOUNTER — Other Ambulatory Visit: Payer: Self-pay | Admitting: General Practice

## 2019-12-23 ENCOUNTER — Other Ambulatory Visit: Payer: Self-pay

## 2019-12-23 DIAGNOSIS — O00109 Unspecified tubal pregnancy without intrauterine pregnancy: Secondary | ICD-10-CM

## 2019-12-24 LAB — BETA HCG QUANT (REF LAB): hCG Quant: 11 m[IU]/mL

## 2019-12-27 ENCOUNTER — Encounter: Payer: Self-pay | Admitting: Family Medicine

## 2019-12-29 ENCOUNTER — Encounter: Payer: Self-pay | Admitting: Student

## 2019-12-29 DIAGNOSIS — O009 Unspecified ectopic pregnancy without intrauterine pregnancy: Secondary | ICD-10-CM | POA: Insufficient documentation

## 2019-12-29 HISTORY — DX: Unspecified ectopic pregnancy without intrauterine pregnancy: O00.90

## 2020-01-06 ENCOUNTER — Encounter: Payer: Self-pay | Admitting: Obstetrics and Gynecology

## 2020-01-06 ENCOUNTER — Other Ambulatory Visit: Payer: Self-pay

## 2020-01-06 ENCOUNTER — Ambulatory Visit (INDEPENDENT_AMBULATORY_CARE_PROVIDER_SITE_OTHER): Payer: Self-pay | Admitting: Obstetrics and Gynecology

## 2020-01-06 ENCOUNTER — Encounter: Payer: Self-pay | Admitting: Family Medicine

## 2020-01-06 VITALS — BP 140/90 | HR 90 | Wt 191.0 lb

## 2020-01-06 DIAGNOSIS — O009 Unspecified ectopic pregnancy without intrauterine pregnancy: Secondary | ICD-10-CM | POA: Diagnosis not present
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
## 2020-01-07 LAB — BETA HCG QUANT (REF LAB): hCG Quant: <1 m[IU]/mL

## 2020-01-10 ENCOUNTER — Other Ambulatory Visit: Payer: Self-pay | Admitting: Obstetrics and Gynecology

## 2020-01-10 ENCOUNTER — Other Ambulatory Visit: Payer: Self-pay | Admitting: Family Medicine

## 2020-01-10 MED ORDER — NORETHINDRONE 0.35 MG PO TABS
1.0000 | ORAL_TABLET | Freq: Every day | ORAL | 3 refills | Status: DC
Start: 2020-01-10 — End: 2020-03-08

## 2020-01-10 MED ORDER — BUPROPION HCL ER (SR) 150 MG PO TB12: 150.0000 mg | ORAL_TABLET | Freq: Every day | ORAL | 1 refills | Status: DC

## 2020-01-16 ENCOUNTER — Encounter: Payer: Self-pay | Admitting: Family Medicine

## 2020-01-16 DIAGNOSIS — I10 Essential (primary) hypertension: Secondary | ICD-10-CM

## 2020-01-17 MED ORDER — AMLODIPINE BESYLATE 10 MG PO TABS: 10.0000 mg | ORAL_TABLET | Freq: Every day | ORAL | 1 refills | Status: DC

## 2020-01-25 ENCOUNTER — Encounter: Payer: Self-pay | Admitting: Family Medicine

## 2020-02-02 DIAGNOSIS — Z419 Encounter for procedure for purposes other than remedying health state, unspecified: Secondary | ICD-10-CM | POA: Diagnosis not present

## 2020-02-11 DIAGNOSIS — R457 State of emotional shock and stress, unspecified: Secondary | ICD-10-CM | POA: Diagnosis not present

## 2020-02-11 DIAGNOSIS — Z743 Need for continuous supervision: Secondary | ICD-10-CM | POA: Diagnosis not present

## 2020-02-11 DIAGNOSIS — R079 Chest pain, unspecified: Secondary | ICD-10-CM | POA: Diagnosis not present

## 2020-03-01 ENCOUNTER — Encounter: Payer: Self-pay | Admitting: Family Medicine

## 2020-03-01 ENCOUNTER — Encounter: Payer: Self-pay | Admitting: *Deleted

## 2020-03-03 DIAGNOSIS — Z419 Encounter for procedure for purposes other than remedying health state, unspecified: Secondary | ICD-10-CM | POA: Diagnosis not present

## 2020-03-08 ENCOUNTER — Other Ambulatory Visit: Payer: Self-pay | Admitting: Family Medicine

## 2020-03-08 ENCOUNTER — Encounter: Payer: Self-pay | Admitting: Family Medicine

## 2020-03-08 ENCOUNTER — Other Ambulatory Visit: Payer: Self-pay

## 2020-03-08 ENCOUNTER — Ambulatory Visit (INDEPENDENT_AMBULATORY_CARE_PROVIDER_SITE_OTHER): Payer: Medicaid Other | Admitting: Family Medicine

## 2020-03-08 VITALS — BP 131/86 | HR 95 | Temp 97.7°F | Ht 63.0 in | Wt 198.0 lb

## 2020-03-08 DIAGNOSIS — Z30013 Encounter for initial prescription of injectable contraceptive: Secondary | ICD-10-CM | POA: Diagnosis not present

## 2020-03-08 DIAGNOSIS — Z87891 Personal history of nicotine dependence: Secondary | ICD-10-CM | POA: Diagnosis not present

## 2020-03-08 DIAGNOSIS — R002 Palpitations: Secondary | ICD-10-CM

## 2020-03-08 LAB — PREGNANCY, URINE: Preg Test, Ur: NEGATIVE

## 2020-03-08 MED ORDER — METOPROLOL TARTRATE 25 MG PO TABS: 12.5000 mg | ORAL_TABLET | Freq: Two times a day (BID) | ORAL | 0 refills | Status: DC

## 2020-03-08 MED ORDER — MEDROXYPROGESTERONE ACETATE 150 MG/ML IM SUSP
150.0000 mg | Freq: Once | INTRAMUSCULAR | Status: AC
  Administered 2020-03-08: 150 mg via INTRAMUSCULAR

## 2020-03-08 MED ORDER — MEDROXYPROGESTERONE ACETATE 150 MG/ML IM SUSP: INTRAMUSCULAR | 3 refills | Status: DC

## 2020-03-08 NOTE — Patient Instructions (Signed)
Start with Metoprolol ONCE daily.  Take 1/2 tablet only.  May increase to 1/2 tablet twice daily after 2 weeks if needed.  Watch heart rate and blood pressure.  You had labs performed today.  You will be contacted with the results of the labs once they are available, usually in the next 3 business days for routine lab work.  If you have an active my chart account, they will be released to your MyChart.  If you prefer to have these labs released to you via telephone, please let us know.  If you had a pap smear or biopsy performed, expect to be contacted in about 7-10 days.

## 2020-03-08 NOTE — Progress Notes (Signed)
Subjective: CC: Contraception counseling PCP: Raliegh Ip, DO GMW:NUUVOZ M Biggers is a 39 y.o. female presenting to clinic today for:  1. Contraceptive counseling Patient presents today for discussion of contraception.  She is a [id number] .  She reports her menstrual cycles are irregular currently.  She is on the minipill and is having quite a bit of breakthrough bleeding and would like to go back to Depo-Provera.  Previous methods of birth control tried: Depo-Provera, Micronor.  She denies personal or family history of: liver disease, breast cancer, clotting disorder (including DVT/PE), migraine headaches. She is a former smoker. Patient's last menstrual period was 02/23/2020.  Last pap: UTD see EMR  2.  Heart palpitations Patient reports ongoing heart palpitations occur at random.  This is not associated with any physical activity.  Sometimes it wakes her up from sleep.  She is had cardiac work-up which was unremarkable.  She is a former tobacco user.  Denies any caffeine use.  Not on any stimulants.  ROS: Per HPI  Allergies  Allergen Reactions   Penicillins     Hives and itching   Sulfa Antibiotics     Hives and itching    Past Medical History:  Diagnosis Date   Anxiety    Arthritis    Depression    Endometriosis    Headache    Hx of tubal ligation 2008   Hypertension    Infection    UTI    Current Outpatient Medications:    amLODipine (NORVASC) 10 MG tablet, Take 1 tablet (10 mg total) by mouth daily., Disp: 90 tablet, Rfl: 1   buPROPion (WELLBUTRIN SR) 150 MG 12 hr tablet, Take 1 tablet (150 mg total) by mouth daily., Disp: 90 tablet, Rfl: 1   diphenhydramine-acetaminophen (TYLENOL PM) 25-500 MG TABS tablet, Take 1 tablet by mouth at bedtime as needed., Disp: , Rfl:    escitalopram (LEXAPRO) 20 MG tablet, Take 1 tablet (20 mg total) by mouth daily., Disp: 30 tablet, Rfl: 5   famotidine (PEPCID) 20 MG tablet, Take 20 mg by mouth daily. , Disp: ,  Rfl:    medroxyPROGESTERone (DEPO-PROVERA) 150 MG/ML injection, Bring to office for injection every 3 months., Disp: 1 mL, Rfl: 3 Social History   Socioeconomic History   Marital status: Divorced    Spouse name: Not on file   Number of children: Not on file   Years of education: Not on file   Highest education level: Not on file  Occupational History   Occupation: CMA  Tobacco Use   Smoking status: Former Smoker    Packs/day: 1.00    Years: 5.00    Pack years: 5.00    Types: Cigarettes    Start date: 05/29/2006    Quit date: 01/07/2020    Years since quitting: 0.1   Smokeless tobacco: Never Used  Vaping Use   Vaping Use: Some days  Substance and Sexual Activity   Alcohol use: Yes    Comment: occasional   Drug use: No   Sexual activity: Yes    Birth control/protection: Injection    Comment: BTL  Other Topics Concern   Not on file  Social History Narrative   Not on file   Social Determinants of Health   Financial Resource Strain:    Difficulty of Paying Living Expenses: Not on file  Food Insecurity: No Food Insecurity   Worried About Running Out of Food in the Last Year: Never true   Ran Out of Food in  the Last Year: Never true  Transportation Needs: No Transportation Needs   Lack of Transportation (Medical): No   Lack of Transportation (Non-Medical): No  Physical Activity:    Days of Exercise per Week: Not on file   Minutes of Exercise per Session: Not on file  Stress:    Feeling of Stress : Not on file  Social Connections:    Frequency of Communication with Friends and Family: Not on file   Frequency of Social Gatherings with Friends and Family: Not on file   Attends Religious Services: Not on file   Active Member of Clubs or Organizations: Not on file   Attends Banker Meetings: Not on file   Marital Status: Not on file  Intimate Partner Violence:    Fear of Current or Ex-Partner: Not on file   Emotionally Abused:  Not on file   Physically Abused: Not on file   Sexually Abused: Not on file   Family History  Problem Relation Age of Onset   Depression Mother    Stroke Mother    Diabetes Father    Hypertension Father    Stroke Father    Heart attack Father 30   Hypertension Sister    Hypertension Brother    Heart attack Brother 41       x3 MI (twin bro)   CAD Brother    Asthma Daughter    Breast cancer Paternal Grandmother 43   Hypertension Brother    Healthy Daughter     Objective: Office vital signs reviewed. BP 131/86    Pulse 95    Temp 97.7 F (36.5 C) (Temporal)    Ht 5\' 3"  (1.6 m)    Wt 198 lb (89.8 kg)    LMP 10/12/2019    SpO2 97%    BMI 35.07 kg/m   Physical Examination:  General: Awake, alert, well nourished, No acute distress HEENT: Normal, sclera white, MMM; no thyromegaly, thyroid nodules.  No exophthalmos Cardio: regular rate and rhythm, S1S2 heard, no murmurs appreciated Pulm: clear to auscultation bilaterally, no wheezes, rhonchi or rales; normal work of breathing on room air Extremities: warm, well perfused, No edema, cyanosis or clubbing; +2 pulses bilaterally MSK: Normal gait and station Neuro: No tremor  Assessment/ Plan: 39 y.o. female   1. Encounter for initial prescription of injectable contraceptive Urine pregnancy negative.  Depo administered.  She will return in 3 months for repeat.  We discussed the risks of the medication and need for discontinuation at 5-year interval for drug holiday.  She was good understanding - Pregnancy, urine - medroxyPROGESTERone (DEPO-PROVERA) injection 150 mg  2. Heart palpitations Uncertain etiology.  Will evaluate for metabolic causes.  Beta-blocker initiated.  Would like her to start with 12.5 mg daily.  After 2 weeks may increase to twice daily dosing.  If she ends up going to twice daily dosing she will contact me and we can consider switching her to the extended release for ease. - CBC with Differential -  TSH - Magnesium - Basic Metabolic Panel - metoprolol tartrate (LOPRESSOR) 25 MG tablet; Take 0.5 tablets (12.5 mg total) by mouth 2 (two) times daily.  Dispense: 90 tablet; Refill: 0  3. Former tobacco use Congratulated her on this.   Orders Placed This Encounter  Procedures   Pregnancy, urine   CBC with Differential   TSH   Magnesium   Basic Metabolic Panel   Meds ordered this encounter  Medications   metoprolol tartrate (LOPRESSOR) 25 MG tablet  Sig: Take 0.5 tablets (12.5 mg total) by mouth 2 (two) times daily.    Dispense:  90 tablet    Refill:  0   medroxyPROGESTERone (DEPO-PROVERA) injection 150 mg     Raliegh Ip, DO [facility] [phone]

## 2020-03-09 LAB — TSH: TSH: 1.85 u[IU]/mL (ref 0.450–4.500)

## 2020-03-09 LAB — CBC WITH DIFFERENTIAL/PLATELET
Basophils Absolute: 0.1 10*3/uL (ref 0.0–0.2)
Basos: 1 %
EOS (ABSOLUTE): 0.4 10*3/uL (ref 0.0–0.4)
Eos: 4 %
Hematocrit: 43.2 % (ref 34.0–46.6)
Hemoglobin: 14.8 g/dL (ref 11.1–15.9)
Immature Grans (Abs): 0 10*3/uL (ref 0.0–0.1)
Immature Granulocytes: 0 %
Lymphocytes Absolute: 1.9 10*3/uL (ref 0.7–3.1)
Lymphs: 21 %
MCH: 31.4 pg (ref 26.6–33.0)
MCHC: 34.3 g/dL (ref 31.5–35.7)
MCV: 92 fL (ref 79–97)
Monocytes Absolute: 0.5 10*3/uL (ref 0.1–0.9)
Monocytes: 5 %
Neutrophils Absolute: 6.2 10*3/uL (ref 1.4–7.0)
Neutrophils: 69 %
Platelets: 245 10*3/uL (ref 150–450)
RBC: 4.71 x10E6/uL (ref 3.77–5.28)
RDW: 13.1 % (ref 11.7–15.4)
WBC: 9 10*3/uL (ref 3.4–10.8)

## 2020-03-09 LAB — BASIC METABOLIC PANEL
BUN/Creatinine Ratio: 11 (ref 9–23)
BUN: 8 mg/dL (ref 6–20)
CO2: 21 mmol/L (ref 20–29)
Calcium: 9 mg/dL (ref 8.7–10.2)
Chloride: 96 mmol/L (ref 96–106)
Creatinine, Ser: 0.74 mg/dL (ref 0.57–1.00)
GFR calc Af Amer: 121 mL/min/{1.73_m2} (ref >59)
GFR calc non Af Amer: 105 mL/min/{1.73_m2} (ref >59)
Glucose: 95 mg/dL (ref 65–99)
Potassium: 3.5 mmol/L (ref 3.5–5.2)
Sodium: 136 mmol/L (ref 134–144)

## 2020-03-09 LAB — MAGNESIUM: Magnesium: 1.8 mg/dL (ref 1.6–2.3)

## 2020-03-15 ENCOUNTER — Encounter: Payer: Self-pay | Admitting: Family Medicine

## 2020-03-16 ENCOUNTER — Encounter: Payer: Self-pay | Admitting: Family

## 2020-03-16 ENCOUNTER — Ambulatory Visit (INDEPENDENT_AMBULATORY_CARE_PROVIDER_SITE_OTHER): Payer: Medicaid Other | Admitting: Family

## 2020-03-16 DIAGNOSIS — R399 Unspecified symptoms and signs involving the genitourinary system: Secondary | ICD-10-CM

## 2020-03-16 MED ORDER — NITROFURANTOIN MONOHYD MACRO 100 MG PO CAPS: 100.0000 mg | ORAL_CAPSULE | Freq: Two times a day (BID) | ORAL | 0 refills | Status: DC

## 2020-03-16 NOTE — Progress Notes (Signed)
° °  Virtual Visit via telephone Note Due to COVID-19 pandemic this visit was conducted virtually. This visit type was conducted due to national recommendations for restrictions regarding the COVID-19 Pandemic (e.g. social distancing, sheltering in place) in an effort to limit this patient's exposure and mitigate transmission in our community. All issues noted in this document were discussed and addressed.  A physical exam was not performed with this format.  I connected with [name] on 03/16/20 at 2:10 pm  by telephone and verified that I am speaking with the correct person using two identifiers. [name] is currently located at home and no one is currently with her during visit. The provider, Jannifer Rodney, FNP is located in their office at time of visit.  I discussed the limitations, risks, security and privacy concerns of performing an evaluation and management service by telephone and the availability of in person appointments. I also discussed with the patient that there may be a patient responsible charge related to this service. The patient expressed understanding and agreed to proceed.   History and Present Illness:  Urinary Frequency  This is a new problem. The current episode started in the past 7 days. The problem occurs intermittently. The problem has been gradually worsening. The quality of the pain is described as burning. The pain is at a severity of 2/10. The pain is mild. Associated symptoms include a discharge, frequency, hesitancy and urgency. Pertinent negatives include no flank pain, hematuria, nausea or vomiting. She has tried increased fluids for the symptoms. The treatment provided mild relief.      Review of Systems  Gastrointestinal: Negative for nausea and vomiting.  Genitourinary: Positive for frequency, hesitancy and urgency. Negative for flank pain and hematuria.     Observations/Objective: No SOB or distress noted   Assessment and Plan: 1. UTI  symptoms Force fluids AZO over the counter X2 days RTO if symptoms worsen or do not improve  - nitrofurantoin, macrocrystal-monohydrate, (MACROBID) 100 MG capsule; Take 1 capsule (100 mg total) by mouth 2 (two) times daily. 1 po BId  Dispense: 14 capsule; Refill: 0     I discussed the assessment and treatment plan with the patient. The patient was provided an opportunity to ask questions and all were answered. The patient agreed with the plan and demonstrated an understanding of the instructions.   The patient was advised to call back or seek an in-person evaluation if the symptoms worsen or if the condition fails to improve as anticipated.  The above assessment and management plan was discussed with the patient. The patient verbalized understanding of and has agreed to the management plan. Patient is aware to call the clinic if symptoms persist or worsen. Patient is aware when to return to the clinic for a follow-up visit. Patient educated on when it is appropriate to go to the emergency department.   Time call ended:  2:18 pm   I provided 8 minutes of non-face-to-face time during this encounter.    Jannifer Rodney, FNP

## 2020-03-19 ENCOUNTER — Telehealth: Payer: Self-pay | Admitting: Physician Assistant

## 2020-03-19 DIAGNOSIS — J029 Acute pharyngitis, unspecified: Secondary | ICD-10-CM

## 2020-03-19 MED ORDER — AZITHROMYCIN 250 MG PO TABS: ORAL_TABLET | ORAL | 0 refills | Status: DC

## 2020-03-19 NOTE — Progress Notes (Signed)

## 2020-03-28 ENCOUNTER — Telehealth: Payer: Medicaid Other | Admitting: Physician Assistant

## 2020-03-28 DIAGNOSIS — J0191 Acute recurrent sinusitis, unspecified: Secondary | ICD-10-CM

## 2020-03-28 MED ORDER — DOXYCYCLINE HYCLATE 100 MG PO CAPS: 100.0000 mg | ORAL_CAPSULE | Freq: Two times a day (BID) | ORAL | 0 refills | Status: DC

## 2020-03-28 NOTE — Progress Notes (Signed)

## 2020-04-03 DIAGNOSIS — Z419 Encounter for procedure for purposes other than remedying health state, unspecified: Secondary | ICD-10-CM | POA: Diagnosis not present

## 2020-05-03 DIAGNOSIS — Z419 Encounter for procedure for purposes other than remedying health state, unspecified: Secondary | ICD-10-CM | POA: Diagnosis not present

## 2020-05-12 ENCOUNTER — Encounter: Payer: Self-pay | Admitting: Family Medicine

## 2020-05-14 ENCOUNTER — Encounter: Payer: Self-pay | Admitting: Family Medicine

## 2020-05-15 ENCOUNTER — Other Ambulatory Visit: Payer: Self-pay | Admitting: Family Medicine

## 2020-05-15 MED ORDER — METOPROLOL SUCCINATE ER 25 MG PO TB24: 12.5000 mg | ORAL_TABLET | Freq: Every day | ORAL | 3 refills | Status: DC

## 2020-06-01 ENCOUNTER — Telehealth: Payer: Medicaid Other | Admitting: Orthopedic Surgery

## 2020-06-01 ENCOUNTER — Telehealth: Payer: Self-pay

## 2020-06-01 DIAGNOSIS — K0889 Other specified disorders of teeth and supporting structures: Secondary | ICD-10-CM

## 2020-06-01 MED ORDER — CLINDAMYCIN HCL 300 MG PO CAPS: 300.0000 mg | ORAL_CAPSULE | Freq: Four times a day (QID) | ORAL | 0 refills | Status: AC

## 2020-06-01 NOTE — Progress Notes (Signed)
E-Visit for Dental Pain  We are sorry that you are not feeling well.  Here is how we plan to help!  Based on what you have shared with me in the questionnaire, it sounds like you have infected tooth.  Clindamycin 300mg  4 times per day for days  It is imperative that you see a dentist within 10 days of this eVisit to determine the cause of the dental pain and be sure it is adequately treated  A toothache or tooth pain is caused when the nerve in the root of a tooth or surrounding a tooth is irritated. Dental (tooth) infection, decay, injury, or loss of a tooth are the most common causes of dental pain. Pain may also occur after an extraction (tooth is pulled out). Pain sometimes originates from other areas and radiates to the jaw, thus appearing to be tooth pain.Bacteria growing inside your mouth can contribute to gum disease and dental decay, both of which can cause pain. A toothache occurs from inflammation of the central portion of the tooth called pulp. The pulp contains nerve endings that are very sensitive to pain. Inflammation to the pulp or pulpitis may be caused by dental cavities, trauma, and infection.    HOME CARE:   For toothaches: . Over-the-counter pain medications such as acetaminophen or ibuprofen may be used. Take these as directed on the package while you arrange for a dental appointment. . Avoid very cold or hot foods, because they may make the pain worse. . You may get relief from biting on a cotton ball soaked in oil of cloves. You can get oil of cloves at most drug stores.  For jaw pain: .  Aspirin may be helpful for problems in the joint of the jaw in adults. . If pain happens every time you open your mouth widely, the temporomandibular joint (TMJ) may be the source of the pain. Yawning or taking a large bite of food may worsen the pain. An appointment with your doctor or dentist will help you find the cause.     GET HELP RIGHT AWAY IF:  . You have a high fever or  chills . If you have had a recent head or face injury and develop headache, light headedness, nausea, vomiting, or other symptoms that concern you after an injury to your face or mouth, you could have a more serious injury in addition to your dental injury. . A facial rash associated with a toothache: This condition may improve with medication. Contact your doctor for them to decide what is appropriate. . Any jaw pain occurring with chest pain: Although jaw pain is most commonly caused by dental disease, it is sometimes referred pain from other areas. People with heart disease, especially people who have had stents placed, people with diabetes, or those who have had heart surgery may have jaw pain as a symptom of heart attack or angina. If your jaw or tooth pain is associated with lightheadedness, sweating, or shortness of breath, you should see a doctor as soon as possible. . Trouble swallowing or excessive pain or bleeding from gums: If you have a history of a weakened immune system, diabetes, or steroid use, you may be more susceptible to infections. Infections can often be more severe and extensive or caused by unusual organisms. Dental and gum infections in people with these conditions may require more aggressive treatment. An abscess may need draining or IV antibiotics, for example.  MAKE SURE YOU    Understand these instructions.  Will  watch your condition.  Will get help right away if you are not doing well or get worse.  Thank you for choosing an e-visit. Your e-visit answers were reviewed by a board certified advanced clinical practitioner to complete your personal care plan. Depending upon the condition, your plan could have included both over the counter or prescription medications. Please review your pharmacy choice. Make sure the pharmacy is open so you can pick up prescription now. If there is a problem, you may contact your provider through [employer] and have the prescription  routed to another pharmacy. Your safety is important to [place]. If you have drug allergies check your prescription carefully.  For the next 24 hours you can use MyChart to ask questions about today's visit, request a non-urgent call back, or ask for a work or school excuse. You will get an email in the next two days asking about your experience. I hope that your e-visit has been valuable and will speed your recovery.  Greater than 5 minutes, yet less than 10 minutes of time have been spent researching, coordinating and implementing care for this patient today.

## 2020-06-01 NOTE — Telephone Encounter (Signed)
Pt called stating that she got a notification from pharmacy that she had refills ready for pickup. Didn't realize until after leaving the store that the medicine was her depo shot.   Pt not due to get her depo shot until 06/08/20 and has an appt that day to do so.  Needs new Rx sent in for depo shot.

## 2020-06-03 DIAGNOSIS — Z419 Encounter for procedure for purposes other than remedying health state, unspecified: Secondary | ICD-10-CM | POA: Diagnosis not present

## 2020-06-03 DIAGNOSIS — Z8759 Personal history of other complications of pregnancy, childbirth and the puerperium: Secondary | ICD-10-CM

## 2020-06-03 HISTORY — DX: Personal history of other complications of pregnancy, childbirth and the puerperium: Z87.59

## 2020-06-04 NOTE — Telephone Encounter (Signed)
Please have Dr. Nadine Counts handle this as PCP

## 2020-06-05 ENCOUNTER — Encounter: Payer: Self-pay | Admitting: Family Medicine

## 2020-06-05 NOTE — Telephone Encounter (Signed)
She has plenty of refills.  Please clarify the question.  The one she picked up should not expire prior to her appt.

## 2020-06-05 NOTE — Telephone Encounter (Signed)
No answer, mailbox full.  Contacted [facility], patient has refills left on her original prescription.

## 2020-06-08 ENCOUNTER — Ambulatory Visit (INDEPENDENT_AMBULATORY_CARE_PROVIDER_SITE_OTHER): Payer: Medicaid Other | Admitting: Family Medicine

## 2020-06-08 ENCOUNTER — Telehealth: Payer: Self-pay | Admitting: Family Medicine

## 2020-06-08 ENCOUNTER — Encounter: Payer: Self-pay | Admitting: Family Medicine

## 2020-06-08 ENCOUNTER — Other Ambulatory Visit: Payer: Self-pay

## 2020-06-08 VITALS — BP 123/87 | HR 88 | Temp 98.2°F | Ht 63.0 in | Wt 215.8 lb

## 2020-06-08 DIAGNOSIS — G479 Sleep disorder, unspecified: Secondary | ICD-10-CM

## 2020-06-08 DIAGNOSIS — F419 Anxiety disorder, unspecified: Secondary | ICD-10-CM | POA: Diagnosis not present

## 2020-06-08 DIAGNOSIS — Z3042 Encounter for surveillance of injectable contraceptive: Secondary | ICD-10-CM | POA: Diagnosis not present

## 2020-06-08 DIAGNOSIS — F10230 Alcohol dependence with withdrawal, uncomplicated: Secondary | ICD-10-CM | POA: Diagnosis not present

## 2020-06-08 DIAGNOSIS — N921 Excessive and frequent menstruation with irregular cycle: Secondary | ICD-10-CM

## 2020-06-08 DIAGNOSIS — Z30013 Encounter for initial prescription of injectable contraceptive: Secondary | ICD-10-CM

## 2020-06-08 MED ORDER — MEDROXYPROGESTERONE ACETATE 150 MG/ML IM SUSP
INTRAMUSCULAR | 0 refills | Status: DC
Start: 2020-06-08 — End: 2020-08-23

## 2020-06-08 MED ORDER — CLONIDINE HCL 0.1 MG PO TABS: 0.1000 mg | ORAL_TABLET | Freq: Every day | ORAL | 0 refills | Status: DC

## 2020-06-08 MED ORDER — MEDROXYPROGESTERONE ACETATE 150 MG/ML IM SUSP
150.0000 mg | Freq: Once | INTRAMUSCULAR | Status: AC
  Administered 2020-06-08: 150 mg via INTRAMUSCULAR

## 2020-06-08 MED ORDER — CHLORDIAZEPOXIDE HCL 25 MG PO CAPS: ORAL_CAPSULE | ORAL | 0 refills | Status: AC

## 2020-06-08 NOTE — Telephone Encounter (Signed)
Depo sent to pharmacy - patient aware

## 2020-06-08 NOTE — Telephone Encounter (Signed)
Pt is calling to make sure that a prescription has been sent to [facility] pharmacy for her to pick up her shot so that she can bring it with her to her appt this afternoon at 3:45

## 2020-06-08 NOTE — Patient Instructions (Signed)
I've double booked you for next Wednesday at 415pm   Alcohol Withdrawal Syndrome Alcohol withdrawal syndrome is a group of symptoms that can develop when a person who drinks heavily and regularly stops drinking or drinks less. Alcohol withdrawal syndrome can be mild or severe, and it may even be life-threatening. Alcohol withdrawal syndrome usually affects people who have alcohol use disorder, which may also be called alcoholism. Alcohol use disorder is when a person is unable to control his or her alcohol use, and drinking too much or too often causes problems at home, at work, or in relationships. What are the causes? Drinking heavily and drinking on a regular basis cause changes in brain chemistry. Over time, the body becomes dependent on alcohol. When alcohol use stops, the chemistry system in the brain becomes unbalanced and causes the symptoms of alcohol withdrawal. What increases the risk? Alcohol withdrawal syndrome is more likely to occur in people who drink more than the recommended limit of alcohol (2 drinks a day for men or 1 drink a day for non-pregnant women). It is also more likely to affect heavy drinkers who have been using alcohol for long periods of time. The more a person drinks and the longer he or she drinks, the greater the risk of alcohol withdrawal syndrome. Severe withdrawal is more likely to develop in someone who:  Had severe alcohol withdrawal in the past.  Had a seizure during a previous episode of alcohol withdrawal.  Is elderly.  Uses other drugs.  Has a long-term (chronic) medical problem, such as heart, lung, or liver disease.  Has depression.  Does not get enough nutrients from his or her diet (malnutrition). What are the signs or symptoms? Symptoms of this condition can be mild to moderate, or they can be severe. Symptoms may develop a few hours (or up to a day) after a person changes his or her drinking patterns. During the 48 hours after he or she has  stopped drinking, the following symptoms may go away or get better:  Uncontrollable shaking (tremor).  Sweating.  Headache.  Anxiety.  Inability to relax (agitation).  Trouble sleeping (insomnia).  Irregular heartbeats (palpitations).  Alcohol cravings.  Seizure. The following symptoms may get worse 24-48 hours after a person has decreased or stopped alcohol use, and they may gradually improve over a period of days or weeks:  Nausea and vomiting.  Fatigue.  Sensitivity to light and sounds.  Confusion and inability to think clearly.  Loss of appetite.  Mood swings, irritability, depression, and anxiety.  Insomnia and nightmares. The following symptoms are severe and life-threatening. When these symptoms occur together, they are called delirium tremens (DTs):  High blood pressure.  Increased heart rate.  Trouble breathing.  Seizures. These may go away along with other symptoms, or they may persist.  Seeing, hearing, feeling, smelling, or tasting things that are not there (hallucinations). If you experience hallucinations, they usually begin 12-24 hours after a change in drinking patterns. Delirium tremens requires immediate hospitalization. How is this diagnosed? This condition may be diagnosed based on:  Your symptoms and medical history.  Your history of alcohol use. Your health care provider may ask questions about your drinking behavior. It is important to be honest when you answer these questions.  A psychological assessment.  A physical exam.  Blood tests or urine tests to measure blood alcohol level and to rule out other causes of symptoms.  MRI or CT scan. This may be done if you seem to have  abnormal thinking or behaviors (altered mental status). Diagnosis can be difficult. People going through withdrawal often avoid seeking medical care and are not thinking clearly. Friends and family members play an important role in recognizing symptoms and  encouraging loved ones to get treatment. How is this treated? Most people with symptoms of withdrawal can be treated outside of a hospital setting (outpatient treatment), with close monitoring such as daily check-ins with a health care provider and counseling. You may need treatment at a hospital or treatment center (inpatient treatment) if:  You have a history of delirium tremens or seizures.  You have severe symptoms.  You are addicted to other drugs.  You cannot swallow medicine.  You have a serious medical condition such as heart failure.  You experienced withdrawal in the past but then you continued drinking alcohol.  You are not likely to commit to an outpatient treatment schedule. Treatment may involve:  Monitoring your blood pressure, pulse, and breathing.  IV fluids to keep you hydrated.  Medicines to reduce withdrawal symptoms and discomfort (benzodiazepines).  Medicine to reduce anxiety.  Medicine to prevent or control seizures.  Multivitamins and B vitamins.  Having a health care provider check on you daily. It is important to get treatment for alcohol withdrawal early. Getting treatment early can:  Speed up your recovery from withdrawal symptoms.  Make you more successful with long-term stoppage of alcohol use (sobriety). If you need help to stop drinking, your health care provider may recommend a long-term treatment plan that includes:  Medicines to help treat alcohol use disorder.  Substance abuse counseling.  Support groups. Follow these instructions at home:   Take over-the-counter and prescription medicines (including vitamin supplements) only as told by your health care provider.  Do not drink alcohol.  Do not drive until your health care provider approves.  Have someone you trust stay with you or be available if you need help with your symptoms or with not drinking.  Drink enough fluid to keep your urine pale yellow.  Consider joining an  alcohol support group or treatment program. These can provide emotional support, advice, and guidance.  Keep all follow-up visits as told by your health care provider. This is important. Contact a health care provider if:  Your symptoms get worse instead of better.  You cannot eat or drink without vomiting.  You are struggling with not drinking alcohol.  You cannot stop drinking alcohol. Get help right away if:  You have an irregular heartbeat.  You have chest pain.  You have trouble breathing.  You have a seizure for the first time.  You hallucinate.  You become very confused. Summary  Alcohol withdrawal is a group of symptoms that can develop when a person who drinks heavily and regularly stops drinking or drinks less.  Symptoms of this condition can be mild to moderate, or they can be severe.  Treatment may include hospitalization, medicine, and counseling. This information is not intended to replace advice given to you by your health care provider. Make sure you discuss any questions you have with your health care provider. Document Revised: 05/02/2017 Document Reviewed: 01/24/2017 Elsevier Patient Education  2020 [employer].

## 2020-06-08 NOTE — Progress Notes (Addendum)
Subjective: CC: Follow-up mood, sleep PCP: Raliegh Ip, DO SEG:BTDVVO M Diprima is a 40 y.o. female presenting to clinic today for:  1.  Mood Patient is compliant with her medication.  She is no longer on Wellbutrin.  She has been continued on Lexapro 20 mg daily.  She goes on to admit that she has an alcohol use disorder and has been an alcoholic for several years now.  This started after the passing of a family member.  She admits to drinking 3-4 drinks per night that can contain up to 10% alcohol.  Sometimes it would cause her to blackout.  Recently she tried to abruptly stop drinking alcohol and went about a week without it but started developing tachycardia, extreme anxiety and insomnia.  She has since resumed use of alcohol and is here to talk about how to come off of this.  She does admit that she has experimented with marijuana, snorting opioids in the past.  She does not do either of these anymore and has not done this for some time now.  2. breakthrough bleeding Patient reports that she has had some bleeding vaginally for the last 2-1/2 to 3 weeks.  She is due for her Depo-Provera shot.  She denies having been under any specific stress but does admit to issues as above.  ROS: Per HPI  Allergies  Allergen Reactions  . Penicillins     Hives and itching  . Sulfa Antibiotics     Hives and itching    Past Medical History:  Diagnosis Date  . Anxiety   . Arthritis   . Depression   . Endometriosis   . Headache   . Hx of tubal ligation 2008  . Hypertension   . Infection    UTI    Current Outpatient Medications:  .  amLODipine (NORVASC) 10 MG tablet, Take 1 tablet (10 mg total) by mouth daily., Disp: 90 tablet, Rfl: 1 .  buPROPion (WELLBUTRIN SR) 150 MG 12 hr tablet, Take 1 tablet (150 mg total) by mouth daily., Disp: 90 tablet, Rfl: 1 .  diphenhydramine-acetaminophen (TYLENOL PM) 25-500 MG TABS tablet, Take 1 tablet by mouth at bedtime as needed., Disp: , Rfl:  .   escitalopram (LEXAPRO) 20 MG tablet, Take 1 tablet (20 mg total) by mouth daily., Disp: 30 tablet, Rfl: 5 .  famotidine (PEPCID) 20 MG tablet, Take 20 mg by mouth daily. , Disp: , Rfl:  .  medroxyPROGESTERone (DEPO-PROVERA) 150 MG/ML injection, Bring to office for injection every 3 months., Disp: 1 mL, Rfl: 0 .  metoprolol succinate (TOPROL-XL) 25 MG 24 hr tablet, Take 0.5 tablets (12.5 mg total) by mouth daily., Disp: 45 tablet, Rfl: 3 Social History   Socioeconomic History  . Marital status: Divorced    Spouse name: Not on file  . Number of children: Not on file  . Years of education: Not on file  . Highest education level: Not on file  Occupational History  . Occupation: CMA  Tobacco Use  . Smoking status: Former Smoker    Packs/day: 1.00    Years: 5.00    Pack years: 5.00    Types: Cigarettes    Start date: 05/29/2006    Quit date: 01/07/2020    Years since quitting: 0.4  . Smokeless tobacco: Never Used  Vaping Use  . Vaping Use: Some days  Substance and Sexual Activity  . Alcohol use: Yes    Comment: occasional  . Drug use: No  . Sexual  activity: Yes    Birth control/protection: Injection    Comment: BTL  Other Topics Concern  . Not on file  Social History Narrative  . Not on file   Social Determinants of Health   Financial Resource Strain: Not on file  Food Insecurity: No Food Insecurity  . Worried About Charity fundraiser in the Last Year: Never true  . Ran Out of Food in the Last Year: Never true  Transportation Needs: No Transportation Needs  . Lack of Transportation (Medical): No  . Lack of Transportation (Non-Medical): No  Physical Activity: Not on file  Stress: Not on file  Social Connections: Not on file  Intimate Partner Violence: Not on file   Family History  Problem Relation Age of Onset  . Depression Mother   . Stroke Mother   . Diabetes Father   . Hypertension Father   . Stroke Father   . Heart attack Father 66  . Hypertension Sister   .  Hypertension Brother   . Heart attack Brother 27       x3 MI (twin bro)  . CAD Brother   . Asthma Daughter   . Breast cancer Paternal Grandmother 84  . Hypertension Brother   . Healthy Daughter     Objective: Office vital signs reviewed. BP 123/87   Pulse 88   Temp 98.2 F (36.8 C) (Temporal)   Ht 5\' 3"  (1.6 m)   Wt 215 lb 12.8 oz (97.9 kg)   LMP 10/12/2019   BMI 38.23 kg/m   Physical Examination:  General: Awake, alert, No acute distress HEENT: Normal, sclera white Cardio: regular rate and rhythm, S1S2 heard, no murmurs appreciated Pulm: clear to auscultation bilaterally, no wheezes, rhonchi or rales; normal work of breathing on room air GI: Obese.  No appreciable hepatosplenomegaly Extremities: warm, well perfused, No edema, cyanosis or clubbing; +2 pulses bilaterally MSK: Normal gait and station Neuro: Mild tremor but no appreciable asterixis Psych: Patient is pleasant, interactive.  Good eye contact.  Thought process is linear.  Does not appear to be responding to internal stimuli Depression screen [facility] 2/9 06/08/2020 03/08/2020 01/06/2020  Decreased Interest 0 0 0  Down, Depressed, Hopeless 0 0 0  PHQ - 2 Score 0 0 0  Altered sleeping 3 0 0  Tired, decreased energy 3 0 0  Change in appetite 0 0 0  Feeling bad or failure about yourself  0 0 0  Trouble concentrating 0 0 0  Moving slowly or fidgety/restless 0 0 0  Suicidal thoughts 0 0 0  PHQ-9 Score 6 0 0  Difficult doing work/chores Not difficult at all - -  Some recent data might be hidden   GAD 7 : Generalized Anxiety Score 06/08/2020 01/06/2020 12/23/2019 12/16/2019  Nervous, Anxious, on Edge 1 0 0 0  Control/stop worrying 2 0 0 0  Worry too much - different things 2 0 0 0  Trouble relaxing 1 0 0 0  Restless 1 0 0 0  Easily annoyed or irritable 2 0 0 0  Afraid - awful might happen 0 0 0 0  Total GAD 7 Score 9 0 0 0  Anxiety Difficulty Somewhat difficult - - -   Epworth sleep score 1  assessment/ Plan: 40 y.o.  female   Alcohol dependence with uncomplicated withdrawal ([place]) - Plan: chlordiazePOXIDE (LIBRIUM) 25 MG capsule, cloNIDine (CATAPRES) 0.1 MG tablet  Anxiety  Sleep difficulties - Plan: cloNIDine (CATAPRES) 0.1 MG tablet  Encounter for initial prescription of  injectable contraceptive - Plan: medroxyPROGESTERone (DEPO-PROVERA) injection 150 mg  Breakthrough bleeding  I think much of the anxiety and sleep difficulties are stemming from alcohol dependence and what sounds like withdrawal.  She is now back on alcohol but I advised her to stop alcohol totally and we are to start Librium.  Instructions for use discussed.  This was reiterated on her AVS and in her prescription.  I am going to see her closely for follow-up on Wednesday of next week.  She is aware of date and time of appointment.  I will also add clonidine 0.1 mg to use at bedtime for rest.  She was given her Depo-Provera shot today.  I suspect the breakthrough bleeding is likely due to physical stressors and needing to get her Depo shot  No orders of the defined types were placed in this encounter.  No orders of the defined types were placed in this encounter.    Raliegh Ip, DO [facility] [phone]

## 2020-06-09 ENCOUNTER — Telehealth: Payer: Self-pay | Admitting: *Deleted

## 2020-06-09 NOTE — Telephone Encounter (Signed)
Fax from [facility] pharmacy RE: Chlordiaze taper Note: Qty & day supply in sig different Please verify

## 2020-06-12 NOTE — Telephone Encounter (Signed)
Pt has med and sent message earlier today

## 2020-06-12 NOTE — Telephone Encounter (Signed)
SIG is correct #14 capsules should cover.  Please have them fill as directed.

## 2020-06-14 ENCOUNTER — Encounter: Payer: Self-pay | Admitting: Family Medicine

## 2020-06-14 ENCOUNTER — Other Ambulatory Visit: Payer: Self-pay

## 2020-06-14 ENCOUNTER — Ambulatory Visit (INDEPENDENT_AMBULATORY_CARE_PROVIDER_SITE_OTHER): Payer: Medicaid Other | Admitting: Family Medicine

## 2020-06-14 VITALS — BP 117/81 | HR 77 | Temp 98.1°F | Ht 63.0 in | Wt 211.0 lb

## 2020-06-14 DIAGNOSIS — F419 Anxiety disorder, unspecified: Secondary | ICD-10-CM | POA: Diagnosis not present

## 2020-06-14 DIAGNOSIS — G479 Sleep disorder, unspecified: Secondary | ICD-10-CM | POA: Diagnosis not present

## 2020-06-14 DIAGNOSIS — F10230 Alcohol dependence with withdrawal, uncomplicated: Secondary | ICD-10-CM

## 2020-06-14 MED ORDER — GABAPENTIN 300 MG PO CAPS: ORAL_CAPSULE | ORAL | 0 refills | Status: DC

## 2020-06-14 NOTE — Progress Notes (Signed)
Subjective: CC: f/u outpatient ETOH detox/ anxiety PCP: Raliegh Ip, DO WJX:BJYNWG M [name] is a 40 y.o. female presenting to clinic today for:  1. Alcoholism/ Anxiety She has 1 more dose of the Librium.  Today is the best day that she has had an overall she really is feeling well.  She is somewhat anxious about finishing the Librium as she is concerned that she may experience overt withdrawal again.  To summarize she had abruptly discontinued her alcohol when we initially saw each other and experienced tachycardia increased blood pressure and insomnia.  She has not had any alcohol since her last visit.  She has been drinking sugar-free juices and this seems to be helping.  She does admit to cravings and wants to talk about that today.  Her spouse is a former alcoholic and drug user who has been sober for over 9 years now.  She reports that he is a great form of support.  She has not yet started going to AA but it is something that she is very interested in.  The clonidine has been equivocal as far as helping with sleep.  ROS: Per HPI  Allergies  Allergen Reactions  . Penicillins     Hives and itching  . Sulfa Antibiotics     Hives and itching    Past Medical History:  Diagnosis Date  . Anxiety   . Arthritis   . Depression   . Endometriosis   . Headache   . Hx of tubal ligation 2008  . Hypertension   . Infection    UTI    Current Outpatient Medications:  .  amLODipine (NORVASC) 10 MG tablet, Take 1 tablet (10 mg total) by mouth daily., Disp: 90 tablet, Rfl: 1 .  buPROPion (WELLBUTRIN SR) 150 MG 12 hr tablet, Take 1 tablet (150 mg total) by mouth daily. (Patient not taking: Reported on 06/08/2020), Disp: 90 tablet, Rfl: 1 .  chlordiazePOXIDE (LIBRIUM) 25 MG capsule, Take 2 capsules (50 mg total) by mouth 3 (three) times daily for 1 day, THEN 1 capsule (25 mg total) 3 (three) times daily for 1 day, THEN 1 capsule (25 mg total) in the morning and at bedtime for 1 day, THEN  1 capsule (25 mg total) at bedtime for 2 days, THEN 1 capsule (25 mg total) every other day for 2 days., Disp: 14 capsule, Rfl: 0 .  cloNIDine (CATAPRES) 0.1 MG tablet, Take 1 tablet (0.1 mg total) by mouth at bedtime. (for sleep), Disp: 30 tablet, Rfl: 0 .  diphenhydramine-acetaminophen (TYLENOL PM) 25-500 MG TABS tablet, Take 1 tablet by mouth at bedtime as needed. (Patient not taking: Reported on 06/08/2020), Disp: , Rfl:  .  escitalopram (LEXAPRO) 20 MG tablet, Take 1 tablet (20 mg total) by mouth daily., Disp: 30 tablet, Rfl: 5 .  famotidine (PEPCID) 20 MG tablet, Take 20 mg by mouth daily. , Disp: , Rfl:  .  medroxyPROGESTERone (DEPO-PROVERA) 150 MG/ML injection, Bring to office for injection every 3 months., Disp: 1 mL, Rfl: 0 .  metoprolol succinate (TOPROL-XL) 25 MG 24 hr tablet, Take 0.5 tablets (12.5 mg total) by mouth daily., Disp: 45 tablet, Rfl: 3 Social History   Socioeconomic History  . Marital status: Divorced    Spouse name: Not on file  . Number of children: Not on file  . Years of education: Not on file  . Highest education level: Not on file  Occupational History  . Occupation: CMA  Tobacco Use  .  Smoking status: Former Smoker    Packs/day: 1.00    Years: 5.00    Pack years: 5.00    Types: Cigarettes    Start date: 05/29/2006    Quit date: 01/07/2020    Years since quitting: 0.4  . Smokeless tobacco: Never Used  Vaping Use  . Vaping Use: Some days  Substance and Sexual Activity  . Alcohol use: Yes    Comment: occasional  . Drug use: No  . Sexual activity: Yes    Birth control/protection: Injection    Comment: BTL  Other Topics Concern  . Not on file  Social History Narrative  . Not on file   Social Determinants of Health   Financial Resource Strain: Not on file  Food Insecurity: No Food Insecurity  . Worried About Programme researcher, broadcasting/film/video in the Last Year: Never true  . Ran Out of Food in the Last Year: Never true  Transportation Needs: No Transportation  Needs  . Lack of Transportation (Medical): No  . Lack of Transportation (Non-Medical): No  Physical Activity: Not on file  Stress: Not on file  Social Connections: Not on file  Intimate Partner Violence: Not on file   Family History  Problem Relation Age of Onset  . Depression Mother   . Stroke Mother   . Diabetes Father   . Hypertension Father   . Stroke Father   . Heart attack Father 30  . Hypertension Sister   . Hypertension Brother   . Heart attack Brother 27       x3 MI (twin bro)  . CAD Brother   . Asthma Daughter   . Breast cancer Paternal Grandmother 7  . Hypertension Brother   . Healthy Daughter     Objective: Office vital signs reviewed. BP 117/81   Pulse 77   Temp 98.1 F (36.7 C) (Temporal)   Ht 5\' 3"  (1.6 m)   Wt 211 lb (95.7 kg)   LMP 10/12/2019   SpO2 99%   BMI 37.38 kg/m   Physical Examination:  General: Awake, alert, well nourished, well appearing. No acute distress HEENT: Normal; sclera white Cardio: regular rate   Pulm: Normal work of breathing on room air Neuro: No tremor.  No asterixis Psych: Mood is stable, speech is normal, thought process linear.  Patient is very pleasant and thankful.  She does not appear to be responding to any internal stimuli Depression screen [facility] 2/9 06/14/2020 06/08/2020 03/08/2020  Decreased Interest 2 0 0  Down, Depressed, Hopeless 2 0 0  PHQ - 2 Score 4 0 0  Altered sleeping 1 3 0  Tired, decreased energy 1 3 0  Change in appetite 1 0 0  Feeling bad or failure about yourself  1 0 0  Trouble concentrating 0 0 0  Moving slowly or fidgety/restless 0 0 0  Suicidal thoughts 0 0 0  PHQ-9 Score 8 6 0  Difficult doing work/chores Somewhat difficult Not difficult at all -  Some recent data might be hidden   GAD 7 : Generalized Anxiety Score 06/14/2020 06/08/2020 01/06/2020 12/23/2019  Nervous, Anxious, on Edge 3 1 0 0  Control/stop worrying 3 2 0 0  Worry too much - different things 3 2 0 0  Trouble relaxing 1 1 0 0   Restless 1 1 0 0  Easily annoyed or irritable 3 2 0 0  Afraid - awful might happen 0 0 0 0  Total GAD 7 Score 14 9 0 0  Anxiety  Difficulty Somewhat difficult Somewhat difficult - -   Assessment/ Plan: 40 y.o. female   Anxiety  Sleep difficulties  Alcohol dependence with uncomplicated withdrawal ([facility]) - Plan: gabapentin (NEURONTIN) 300 MG capsule  She is having some breakthrough anxiety which I do expect with withdrawal from the alcohol.  She has 1 more dose of the Librium left which she will take tomorrow.  I am starting her on gabapentin 300 mg and she will increase to twice daily in 5 days if needed.  We will follow-up again in the next 2 weeks to see how she is doing.  She knows to reach out to me if she needs me sooner  I have given her information for AA with a meeting that is available tomorrow in May and then  Continue the clonidine for now.  May consider discontinuing once gabapentin is working well.  No orders of the defined types were placed in this encounter.  No orders of the defined types were placed in this encounter.    Raliegh Ip, DO [facility] [phone]

## 2020-06-15 ENCOUNTER — Encounter: Payer: Self-pay | Admitting: Family Medicine

## 2020-06-19 ENCOUNTER — Encounter: Payer: Self-pay | Admitting: Family Medicine

## 2020-06-27 ENCOUNTER — Encounter: Payer: Self-pay | Admitting: Family Medicine

## 2020-06-30 ENCOUNTER — Ambulatory Visit (INDEPENDENT_AMBULATORY_CARE_PROVIDER_SITE_OTHER): Payer: Medicaid Other | Admitting: Family Medicine

## 2020-06-30 DIAGNOSIS — Z20822 Contact with and (suspected) exposure to covid-19: Secondary | ICD-10-CM | POA: Diagnosis not present

## 2020-06-30 DIAGNOSIS — F1021 Alcohol dependence, in remission: Secondary | ICD-10-CM | POA: Diagnosis not present

## 2020-06-30 DIAGNOSIS — G479 Sleep disorder, unspecified: Secondary | ICD-10-CM

## 2020-06-30 MED ORDER — ALBUTEROL SULFATE HFA 108 (90 BASE) MCG/ACT IN AERS: 2.0000 | INHALATION_SPRAY | Freq: Four times a day (QID) | RESPIRATORY_TRACT | 0 refills | Status: DC | PRN

## 2020-06-30 MED ORDER — PROMETHAZINE-DM 6.25-15 MG/5ML PO SYRP: 2.5000 mL | ORAL_SOLUTION | Freq: Four times a day (QID) | ORAL | 0 refills | Status: DC | PRN

## 2020-06-30 MED ORDER — BENZONATATE 100 MG PO CAPS: 100.0000 mg | ORAL_CAPSULE | Freq: Three times a day (TID) | ORAL | 0 refills | Status: DC | PRN

## 2020-06-30 MED ORDER — TRAZODONE HCL 50 MG PO TABS: 25.0000 mg | ORAL_TABLET | Freq: Every evening | ORAL | 3 refills | Status: DC | PRN

## 2020-06-30 MED ORDER — GABAPENTIN 300 MG PO CAPS
300.0000 mg | ORAL_CAPSULE | Freq: Two times a day (BID) | ORAL | 3 refills | Status: DC
Start: 2020-06-30 — End: 2020-10-03

## 2020-06-30 NOTE — Progress Notes (Signed)
Telephone visit  Subjective: CC: follow up alcoholism PCP: Raliegh Ip, DO WCH:ENIDPO M Cumbee is a 40 y.o. female calls for telephone consult today. Patient provides verbal consent for consult held via phone.  Due to COVID-19 pandemic this visit was conducted virtually. This visit type was conducted due to national recommendations for restrictions regarding the COVID-19 Pandemic (e.g. social distancing, sheltering in place) in an effort to limit this patient's exposure and mitigate transmission in our community. All issues noted in this document were discussed and addressed.  A physical exam was not performed with this format.   Location of patient: home Location of provider: [facility] Others present for call: none  1. Alcoholism She was having some swelling in her ankles.  This seems to be little bit better after reducing her gabapentin dose to twice daily.  She does still have some edema at the very end of the day if she stands on her feet all day but otherwise is doing okay from the standpoint.  She has been now sober for 3 weeks and 1 day.  She continues to rely on her spouse and father-in-law as her primary support but is planning on attending an AA meeting soon.  2. COVID exposure She reports sudden onset of cough, fatigue, arthralgia and feeling short of breath. No wheezing. Nonsmoker.  She was exposed to COVID-19 through her niece.  Symptoms suddenly onset yesterday.  No hemoptysis, diarrhea or vomiting.  ROS: Per HPI  Allergies  Allergen Reactions  . Penicillins     Hives and itching  . Sulfa Antibiotics     Hives and itching    Past Medical History:  Diagnosis Date  . Anxiety   . Arthritis   . Depression   . Endometriosis   . Headache   . Hx of tubal ligation 2008  . Hypertension   . Infection    UTI    Current Outpatient Medications:  .  amLODipine (NORVASC) 10 MG tablet, Take 1 tablet (10 mg total) by mouth daily., Disp: 90 tablet, Rfl: 1 .  buPROPion  (WELLBUTRIN SR) 150 MG 12 hr tablet, Take 1 tablet (150 mg total) by mouth daily. (Patient not taking: No sig reported), Disp: 90 tablet, Rfl: 1 .  cloNIDine (CATAPRES) 0.1 MG tablet, Take 1 tablet (0.1 mg total) by mouth at bedtime. (for sleep), Disp: 30 tablet, Rfl: 0 .  diphenhydramine-acetaminophen (TYLENOL PM) 25-500 MG TABS tablet, Take 1 tablet by mouth at bedtime as needed. (Patient not taking: No sig reported), Disp: , Rfl:  .  escitalopram (LEXAPRO) 20 MG tablet, Take 1 tablet (20 mg total) by mouth daily., Disp: 30 tablet, Rfl: 5 .  famotidine (PEPCID) 20 MG tablet, Take 20 mg by mouth daily. , Disp: , Rfl:  .  gabapentin (NEURONTIN) 300 MG capsule, Take 1 capsule (300 mg total) by mouth at bedtime for 5 days, THEN 1 capsule (300 mg total) 2 (two) times daily for 5 days, THEN 1 capsule (300 mg total) 3 (three) times daily for 10 days., Disp: 45 capsule, Rfl: 0 .  medroxyPROGESTERone (DEPO-PROVERA) 150 MG/ML injection, Bring to office for injection every 3 months., Disp: 1 mL, Rfl: 0 .  metoprolol succinate (TOPROL-XL) 25 MG 24 hr tablet, Take 0.5 tablets (12.5 mg total) by mouth daily., Disp: 45 tablet, Rfl: 3  Assessment/ Plan: 40 y.o. female   Alcoholism in recovery (HCC) - Plan: gabapentin (NEURONTIN) 300 MG capsule  Contact with and (suspected) exposure to covid-19 - Plan: Novel Coronavirus,  NAA ([facility]), benzonatate (TESSALON PERLES) 100 MG capsule, promethazine-dextromethorphan (PROMETHAZINE-DM) 6.25-15 MG/5ML syrup, albuterol (VENTOLIN HFA) 108 (90 Base) MCG/ACT inhaler  Sleep difficulties - Plan: traZODone (DESYREL) 50 MG tablet  Doing well from an alcohol recovery standpoint.  She will be continued on gabapentin 300 mg twice daily and a new prescription has been sent to her pharmacy.  She is having ongoing sleep difficulties and therefore of added trazodone to her regimen.  I cautioned her with use of the SSRI and we discussed signs and symptoms of serotonin syndrome today.   Clonidine discontinued  It sounds like she has COVID-19.  I have given her cough medication, albuterol inhaler.  We discussed red flag signs and symptoms warranting further evaluation and she voiced good understanding of this.  She will come today to get COVID-19 testing  Start time: 1:04pm End time: 1:15pm  Total time spent on patient care (including telephone call/ virtual visit): 11 minutes  Maurina Fawaz Hulen Skains, DO [facility] [phone]

## 2020-07-02 LAB — NOVEL CORONAVIRUS, NAA

## 2020-07-03 ENCOUNTER — Encounter: Payer: Self-pay | Admitting: Family Medicine

## 2020-07-04 DIAGNOSIS — Z419 Encounter for procedure for purposes other than remedying health state, unspecified: Secondary | ICD-10-CM | POA: Diagnosis not present

## 2020-07-05 ENCOUNTER — Encounter: Payer: Self-pay | Admitting: Family Medicine

## 2020-07-05 ENCOUNTER — Telehealth: Payer: Medicaid Other | Admitting: Nurse Practitioner

## 2020-07-05 DIAGNOSIS — J029 Acute pharyngitis, unspecified: Secondary | ICD-10-CM | POA: Diagnosis not present

## 2020-07-05 MED ORDER — NAPROXEN 500 MG PO TABS: 500.0000 mg | ORAL_TABLET | Freq: Two times a day (BID) | ORAL | 0 refills | Status: DC

## 2020-07-05 NOTE — Addendum Note (Signed)
Addended by: Bennie Pierini on: 07/05/2020 02:28 PM   Modules accepted: Orders

## 2020-07-05 NOTE — Progress Notes (Signed)

## 2020-07-17 ENCOUNTER — Encounter: Payer: Self-pay | Admitting: Family Medicine

## 2020-07-17 ENCOUNTER — Other Ambulatory Visit: Payer: Self-pay | Admitting: Family Medicine

## 2020-07-17 DIAGNOSIS — I10 Essential (primary) hypertension: Secondary | ICD-10-CM

## 2020-07-17 MED ORDER — AMLODIPINE BESYLATE 10 MG PO TABS: 10.0000 mg | ORAL_TABLET | Freq: Every day | ORAL | 1 refills | Status: DC

## 2020-07-24 ENCOUNTER — Encounter: Payer: Self-pay | Admitting: Family Medicine

## 2020-07-26 ENCOUNTER — Encounter: Payer: Self-pay | Admitting: Family Medicine

## 2020-08-01 DIAGNOSIS — Z419 Encounter for procedure for purposes other than remedying health state, unspecified: Secondary | ICD-10-CM | POA: Diagnosis not present

## 2020-08-04 ENCOUNTER — Telehealth: Payer: Self-pay

## 2020-08-07 ENCOUNTER — Encounter: Payer: Self-pay | Admitting: Family Medicine

## 2020-08-07 ENCOUNTER — Other Ambulatory Visit: Payer: Self-pay | Admitting: Family Medicine

## 2020-08-07 NOTE — Telephone Encounter (Signed)
Appt scheduled for mirena insertion please order one

## 2020-08-08 NOTE — Telephone Encounter (Signed)
Ordered

## 2020-08-09 ENCOUNTER — Telehealth: Payer: Medicaid Other | Admitting: Nurse Practitioner

## 2020-08-09 DIAGNOSIS — M545 Low back pain, unspecified: Secondary | ICD-10-CM | POA: Diagnosis not present

## 2020-08-09 MED ORDER — BACLOFEN 10 MG PO TABS
10.0000 mg | ORAL_TABLET | Freq: Three times a day (TID) | ORAL | 0 refills | Status: DC
Start: 2020-08-09 — End: 2020-12-15

## 2020-08-09 MED ORDER — NAPROXEN 500 MG PO TABS: 500.0000 mg | ORAL_TABLET | Freq: Two times a day (BID) | ORAL | 0 refills | Status: DC

## 2020-08-09 NOTE — Progress Notes (Signed)

## 2020-08-23 ENCOUNTER — Ambulatory Visit (INDEPENDENT_AMBULATORY_CARE_PROVIDER_SITE_OTHER): Payer: Medicaid Other | Admitting: Family Medicine

## 2020-08-23 ENCOUNTER — Encounter: Payer: Self-pay | Admitting: Family Medicine

## 2020-08-23 ENCOUNTER — Other Ambulatory Visit: Payer: Self-pay

## 2020-08-23 VITALS — BP 120/80 | HR 85 | Temp 98.7°F | Ht 63.0 in | Wt 204.0 lb

## 2020-08-23 DIAGNOSIS — B9689 Other specified bacterial agents as the cause of diseases classified elsewhere: Secondary | ICD-10-CM | POA: Diagnosis not present

## 2020-08-23 DIAGNOSIS — F1021 Alcohol dependence, in remission: Secondary | ICD-10-CM

## 2020-08-23 DIAGNOSIS — F419 Anxiety disorder, unspecified: Secondary | ICD-10-CM

## 2020-08-23 DIAGNOSIS — F321 Major depressive disorder, single episode, moderate: Secondary | ICD-10-CM | POA: Diagnosis not present

## 2020-08-23 DIAGNOSIS — N760 Acute vaginitis: Secondary | ICD-10-CM | POA: Diagnosis not present

## 2020-08-23 MED ORDER — HYDROXYZINE HCL 50 MG PO TABS: 25.0000 mg | ORAL_TABLET | Freq: Three times a day (TID) | ORAL | 0 refills | Status: DC | PRN

## 2020-08-23 MED ORDER — BUPROPION HCL ER (XL) 150 MG PO TB24: 150.0000 mg | ORAL_TABLET | Freq: Every day | ORAL | 2 refills | Status: DC

## 2020-08-23 MED ORDER — METRONIDAZOLE 0.75 % VA GEL: VAGINAL | 1 refills | Status: DC

## 2020-08-23 MED ORDER — METRONIDAZOLE 500 MG PO TABS: 500.0000 mg | ORAL_TABLET | Freq: Two times a day (BID) | ORAL | 0 refills | Status: DC

## 2020-08-23 NOTE — Patient Instructions (Signed)
Taking the medicine as directed and not missing any doses is one of the best things you can do to treat your depression.  Here are some things to keep in mind:  1) Side effects (stomach upset, some increased anxiety) may happen before you notice a benefit.  These side effects typically go away over time. 2) Changes to your dose of medicine or a change in medication all together is sometimes necessary 3) Most people need to be on medication at least 12 months 4) Many people will notice an improvement within two weeks but the full effect of the medication can take up to 4-6 weeks 5) Stopping the medication when you start feeling better often results in a return of symptoms 6) Never discontinue your medication without contacting a health care professional first.  Some medications require gradual discontinuation/ taper and can make you sick if you stop them abruptly.  If your symptoms worsen or you have thoughts of suicide/homicide, PLEASE SEEK IMMEDIATE MEDICAL ATTENTION.  You may always call:  National Suicide Hotline: 800-273-8255 [place] Crisis Line: 336-832-9700 Crisis Recovery in [place]: 800-939-5911   These are available 24 hours a day, 7 days a week.   

## 2020-08-23 NOTE — Progress Notes (Signed)
Subjective: CC: Anxiety disorder, depression PCP: Raliegh Ip, DO YCX:KGYJEH M Lippold is a 40 y.o. female presenting to clinic today for:  1.  Anxiety disorder, depression, alcoholism in remission Patient reports longstanding history of anxiety and depression.  She is compliant with Lexapro 20 mg.  She does report that she has low motivation, difficulty completing tasks.  Her energy and drive are decreased.  Sometimes she will push through things but often she finds it hard to even go out of the house.  She reports her anxiety can be severe at sometimes as well, also impacting her ability to leave the home.  She then becomes anxious when people come to her home and describes an intense flushing and itching during these times.  She has been sober for 3 months from alcohol now and is doing really well with this.  She takes gabapentin every day only once daily.  When she has a craving for alcohol however, she will take an extra dose and this seems to curb her cravings.  She reports that she has extremely good support from her family.  Denies any excessive daytime sedation, falls.  2.  Vaginal odor Patient reports that she has a vaginal odor with discharge.  This occurs after every intercourse.  She is due to have an IUD placed tomorrow and she worries about this.  Not currently being treated with anything.  Does not report any pain, itching or nausea   ROS: Per HPI  Allergies  Allergen Reactions  . Penicillins     Hives and itching  . Sulfa Antibiotics     Hives and itching    Past Medical History:  Diagnosis Date  . Anxiety   . Arthritis   . Depression   . Endometriosis   . Headache   . Hx of tubal ligation 2008  . Hypertension   . Infection    UTI    Current Outpatient Medications:  .  albuterol (VENTOLIN HFA) 108 (90 Base) MCG/ACT inhaler, Inhale 2 puffs into the lungs every 6 (six) hours as needed for wheezing or shortness of breath., Disp: 8 g, Rfl: 0 .  amLODipine  (NORVASC) 10 MG tablet, Take 1 tablet (10 mg total) by mouth daily., Disp: 90 tablet, Rfl: 1 .  baclofen (LIORESAL) 10 MG tablet, Take 1 tablet (10 mg total) by mouth 3 (three) times daily., Disp: 30 each, Rfl: 0 .  escitalopram (LEXAPRO) 20 MG tablet, Take 1 tablet (20 mg total) by mouth daily., Disp: 30 tablet, Rfl: 5 .  famotidine (PEPCID) 20 MG tablet, Take 20 mg by mouth daily. , Disp: , Rfl:  .  gabapentin (NEURONTIN) 300 MG capsule, Take 1 capsule (300 mg total) by mouth 2 (two) times daily., Disp: 60 capsule, Rfl: 3 .  medroxyPROGESTERone (DEPO-PROVERA) 150 MG/ML injection, Bring to office for injection every 3 months., Disp: 1 mL, Rfl: 0 .  metoprolol succinate (TOPROL-XL) 25 MG 24 hr tablet, Take 0.5 tablets (12.5 mg total) by mouth daily., Disp: 45 tablet, Rfl: 3 .  naproxen (NAPROSYN) 500 MG tablet, Take 1 tablet (500 mg total) by mouth 2 (two) times daily with a meal., Disp: 30 tablet, Rfl: 0 .  traZODone (DESYREL) 50 MG tablet, Take 0.5-1 tablets (25-50 mg total) by mouth at bedtime as needed for sleep., Disp: 30 tablet, Rfl: 3 Social History   Socioeconomic History  . Marital status: Divorced    Spouse name: Not on file  . Number of children: Not on file  .  Years of education: Not on file  . Highest education level: Not on file  Occupational History  . Occupation: CMA  Tobacco Use  . Smoking status: Former Smoker    Packs/day: 1.00    Years: 5.00    Pack years: 5.00    Types: Cigarettes    Start date: 05/29/2006    Quit date: 01/07/2020    Years since quitting: 0.6  . Smokeless tobacco: Never Used  Vaping Use  . Vaping Use: Some days  Substance and Sexual Activity  . Alcohol use: Yes    Comment: occasional  . Drug use: No  . Sexual activity: Yes    Birth control/protection: Injection    Comment: BTL  Other Topics Concern  . Not on file  Social History Narrative  . Not on file   Social Determinants of Health   Financial Resource Strain: Not on file  Food  Insecurity: No Food Insecurity  . Worried About Programme researcher, broadcasting/film/video in the Last Year: Never true  . Ran Out of Food in the Last Year: Never true  Transportation Needs: No Transportation Needs  . Lack of Transportation (Medical): No  . Lack of Transportation (Non-Medical): No  Physical Activity: Not on file  Stress: Not on file  Social Connections: Not on file  Intimate Partner Violence: Not on file   Family History  Problem Relation Age of Onset  . Depression Mother   . Stroke Mother   . Diabetes Father   . Hypertension Father   . Stroke Father   . Heart attack Father 30  . Hypertension Sister   . Hypertension Brother   . Heart attack Brother 27       x3 MI (twin bro)  . CAD Brother   . Asthma Daughter   . Breast cancer Paternal Grandmother 61  . Hypertension Brother   . Healthy Daughter     Objective: Office vital signs reviewed. BP 120/80   Pulse 85   Temp 98.7 F (37.1 C) (Temporal)   Ht 5\' 3"  (1.6 m)   Wt 204 lb (92.5 kg)   LMP 10/12/2019   BMI 36.14 kg/m   Physical Examination:  General: Awake, alert, well nourished, No acute distress HEENT: Normal, sclera white, MMM Cardio: regular rate and rhythm, S1S2 heard, no murmurs appreciated Pulm: clear to auscultation bilaterally, no wheezes, rhonchi or rales; normal work of breathing on room air Extremities: warm, well perfused, No edema, cyanosis or clubbing; +2 pulses bilaterally Psych: Mood stable, speech normal, affect appropriate.  Patient is pleasant and interactive  Depression screen [facility] 2/9 08/23/2020 06/14/2020 06/08/2020  Decreased Interest 2 2 0  Down, Depressed, Hopeless 3 2 0  PHQ - 2 Score 5 4 0  Altered sleeping 1 1 3   Tired, decreased energy 2 1 3   Change in appetite 2 1 0  Feeling bad or failure about yourself  1 1 0  Trouble concentrating 2 0 0  Moving slowly or fidgety/restless 0 0 0  Suicidal thoughts 0 0 0  PHQ-9 Score 13 8 6   Difficult doing work/chores - Somewhat difficult Not difficult  at all  Some recent data might be hidden   GAD 7 : Generalized Anxiety Score 08/23/2020 06/14/2020 06/08/2020 01/06/2020  Nervous, Anxious, on Edge 2 3 1  0  Control/stop worrying 2 3 2  0  Worry too much - different things 3 3 2  0  Trouble relaxing 2 1 1  0  Restless 1 1 1  0  Easily annoyed or  irritable 1 3 2  0  Afraid - awful might happen 0 0 0 0  Total GAD 7 Score 11 14 9  0  Anxiety Difficulty - Somewhat difficult Somewhat difficult -    Assessment/ Plan: 40 y.o. female   Anxiety - Plan: hydrOXYzine (ATARAX/VISTARIL) 50 MG tablet  Depression, major, single episode, moderate (HCC) - Plan: buPROPion (WELLBUTRIN XL) 150 MG 24 hr tablet  Alcoholism in recovery (HCC)  Bacterial vaginosis - Plan: metroNIDAZOLE (FLAGYL) 500 MG tablet, metroNIDAZOLE (METROGEL VAGINAL) 0.75 % vaginal gel  As needed Atarax for anxiety.  Caution sedation  Wellbutrin added for depressive symptoms.  She is previously been on this for smoking cessation tolerated without difficulty.  Continue Lexapro  She continues to maintain sobriety.  I congratulated her on this.  She describes bacterial vaginosis and have given her is 1 week treatment with oral Flagyl as well as ongoing preventive treatment with vaginal metronidazole as needed.  She is to reschedule IUD placement.  Provider has been informed of this and agrees with plan.    She will follow-up with me in 6 weeks for follow-up on mood.   No orders of the defined types were placed in this encounter.  No orders of the defined types were placed in this encounter.    , DO [facility] [phone]

## 2020-08-24 ENCOUNTER — Ambulatory Visit: Payer: Medicaid Other | Admitting: Family Medicine

## 2020-09-01 ENCOUNTER — Other Ambulatory Visit: Payer: Self-pay

## 2020-09-01 ENCOUNTER — Encounter: Payer: Self-pay | Admitting: Family Medicine

## 2020-09-01 ENCOUNTER — Other Ambulatory Visit (HOSPITAL_COMMUNITY)
Admission: RE | Admit: 2020-09-01 | Discharge: 2020-09-01 | Disposition: A | Discharge status: Home / Self Care | Payer: Medicaid Other | Source: Ambulatory Visit | Attending: Family Medicine | Admitting: Family Medicine

## 2020-09-01 ENCOUNTER — Ambulatory Visit: Payer: Medicaid Other | Admitting: Family Medicine

## 2020-09-01 VITALS — BP 133/88 | HR 90 | Ht 63.0 in | Wt 205.0 lb

## 2020-09-01 DIAGNOSIS — Z30430 Encounter for insertion of intrauterine contraceptive device: Secondary | ICD-10-CM

## 2020-09-01 DIAGNOSIS — Z01419 Encounter for gynecological examination (general) (routine) without abnormal findings: Secondary | ICD-10-CM | POA: Insufficient documentation

## 2020-09-01 DIAGNOSIS — Z419 Encounter for procedure for purposes other than remedying health state, unspecified: Secondary | ICD-10-CM | POA: Diagnosis not present

## 2020-09-01 LAB — PREGNANCY, URINE: Preg Test, Ur: NEGATIVE

## 2020-09-01 MED ORDER — LEVONORGESTREL 20 MCG/24HR IU IUD
1.0000 | INTRAUTERINE_SYSTEM | Freq: Once | INTRAUTERINE | Status: AC
Start: 2020-09-01 — End: ?

## 2020-09-01 NOTE — Progress Notes (Signed)
BP 133/88   Pulse 90   Ht 5\' 3"  (1.6 m)   Wt 205 lb (93 kg)   LMP 10/12/2019   SpO2 98%   BMI 36.31 kg/m    Subjective:   Patient ID: [id number], female    DOB: 1980-11-14, 40 y.o.   MRN: [id number]  HPI: [name] is a 40 y.o. female presenting on 09/01/2020 for Contraceptive Management (Mirena insertion today)   HPI Well woman exam gynecological exam and IUD placement Patient is coming today for wellness exam and physical related placement. Patient denies any chest pain, shortness of breath, headaches or vision issues, abdominal complaints, diarrhea, nausea, vomiting, or joint issues.    Relevant past medical, surgical, family and social history reviewed and updated as indicated. Interim medical history since our last visit reviewed. Allergies and medications reviewed and updated.  Review of Systems  Constitutional: Negative for chills and fever.  HENT: Negative for ear pain and tinnitus.   Eyes: Negative for pain.  Respiratory: Negative for cough, shortness of breath and wheezing.   Cardiovascular: Negative for chest pain, palpitations and leg swelling.  Gastrointestinal: Negative for abdominal pain, blood in stool, constipation and diarrhea.  Genitourinary: Negative for dysuria and hematuria.  Musculoskeletal: Negative for back pain and myalgias.  Skin: Negative for rash.  Neurological: Negative for dizziness, weakness and headaches.  Psychiatric/Behavioral: Negative for suicidal ideas.    Per HPI unless specifically indicated above   Allergies as of 09/01/2020      Reactions   Penicillins    Hives and itching   Sulfa Antibiotics    Hives and itching      Medication List       Accurate as of September 01, 2020  4:27 PM. If you have any questions, ask your nurse or doctor.        amLODipine 10 MG tablet Commonly known as: NORVASC Take 1 tablet (10 mg total) by mouth daily.   baclofen 10 MG tablet Commonly known as: LIORESAL Take 1 tablet (10 mg total)  by mouth 3 (three) times daily.   buPROPion 150 MG 24 hr tablet Commonly known as: Wellbutrin XL Take 1 tablet (150 mg total) by mouth daily.   clindamycin 300 MG capsule Commonly known as: CLEOCIN Take by mouth.   escitalopram 20 MG tablet Commonly known as: Lexapro Take 1 tablet (20 mg total) by mouth daily.   famotidine 20 MG tablet Commonly known as: PEPCID Take 20 mg by mouth daily.   gabapentin 300 MG capsule Commonly known as: NEURONTIN Take 1 capsule (300 mg total) by mouth 2 (two) times daily.   hydrOXYzine 50 MG tablet Commonly known as: ATARAX/VISTARIL Take 0.5-1 tablets (25-50 mg total) by mouth every 8 (eight) hours as needed for anxiety.   ibuprofen 600 MG tablet Commonly known as: ADVIL Take 600 mg by mouth 4 (four) times daily as needed.   metoprolol succinate 25 MG 24 hr tablet Commonly known as: TOPROL-XL Take 0.5 tablets (12.5 mg total) by mouth daily.   metroNIDAZOLE 0.75 % vaginal gel Commonly known as: METROGEL VAGINAL Use 1 applicatorful vaginally immediately after intercourse as needed.   metroNIDAZOLE 500 MG tablet Commonly known as: Flagyl Take 1 tablet (500 mg total) by mouth 2 (two) times daily.   naproxen 500 MG tablet Commonly known as: Naprosyn Take 1 tablet (500 mg total) by mouth 2 (two) times daily with a meal.   traZODone 50 MG tablet Commonly known as: DESYREL Take 0.5-1  tablets (25-50 mg total) by mouth at bedtime as needed for sleep.        Objective:   BP 133/88   Pulse 90   Ht 5\' 3"  (1.6 m)   Wt 205 lb (93 kg)   LMP 10/12/2019   SpO2 98%   BMI 36.31 kg/m   Wt Readings from Last 3 Encounters:  09/01/20 205 lb (93 kg)  08/23/20 204 lb (92.5 kg)  06/14/20 211 lb (95.7 kg)    Physical Exam Vitals and nursing note reviewed. Exam conducted with a chaperone present.  Constitutional:      General: She is not in acute distress.    Appearance: She is well-developed. She is not diaphoretic.  Eyes:      Conjunctiva/sclera: Conjunctivae normal.     Pupils: Pupils are equal, round, and reactive to light.  Neck:     Thyroid: No thyromegaly.  Cardiovascular:     Rate and Rhythm: Normal rate and regular rhythm.     Heart sounds: Normal heart sounds. No murmur heard.   Pulmonary:     Effort: Pulmonary effort is normal. No respiratory distress.     Breath sounds: Normal breath sounds. No wheezing.  Chest:  Breasts: Breasts are symmetrical.     Right: No inverted nipple, mass, nipple discharge, skin change or tenderness.     Left: No inverted nipple, mass, nipple discharge, skin change or tenderness.    Abdominal:     General: Bowel sounds are normal. There is no distension.     Palpations: Abdomen is soft.     Tenderness: There is no abdominal tenderness. There is no guarding or rebound.  Genitourinary:    Exam position: Supine.     Labia:        Right: No rash or lesion.        Left: No rash or lesion.      Vagina: Normal.     Cervix: No cervical motion tenderness, discharge or friability.     Uterus: Not deviated, not enlarged, not fixed and not tender.      Adnexa:        Right: No mass or tenderness.         Left: No mass or tenderness.    Musculoskeletal:        General: Normal range of motion.     Cervical back: Neck supple.  Lymphadenopathy:     Cervical: No cervical adenopathy.  Skin:    General: Skin is warm and dry.     Findings: No rash.  Neurological:     Mental Status: She is alert and oriented to person, place, and time.     Coordination: Coordination normal.  Psychiatric:        Behavior: Behavior normal.     IUD insertion procedure: Patient was placed in stirrups and use a speculum. Patient was prepped with Betadine swabs using cotton balls. Tenaculum was used to grab the anterior cervix. Uterine sound was performed and found to be 8.5 cm in length and small size Cervical dilation was needed. Mirena was placed using factory device at the correct depth that  was measured and deployed without issue. The strings were cut leaving 1.5 cm extra. Patient tolerated procedure well and bleeding was minimal.   Assessment & Plan:   Problem List Items Addressed This Visit   None   Visit Diagnoses    Well woman exam with routine gynecological exam    -  Primary   Relevant Orders  Cytology - PAP([place])   Encounter for insertion of intrauterine contraceptive device (IUD)       Relevant Medications   levonorgestrel (MIRENA) 20 MCG/24HR IUD 1 each (Start on 09/01/2020  4:30 PM)   Other Relevant Orders   Pregnancy, urine   Cytology - PAP([place])       Follow up plan: Return in about 4 weeks (around 09/29/2020), or if symptoms worsen or fail to improve, for IUD placement recheck.  Counseling provided for all of the vaccine components Orders Placed This Encounter  Procedures  . Pregnancy, urine    Arville Care, MD [facility] 09/01/2020, 4:26 PM

## 2020-09-06 LAB — CYTOLOGY - PAP: Diagnosis: NEGATIVE

## 2020-09-14 ENCOUNTER — Telehealth: Payer: Medicaid Other | Admitting: Physician Assistant

## 2020-09-14 DIAGNOSIS — N898 Other specified noninflammatory disorders of vagina: Secondary | ICD-10-CM

## 2020-09-14 NOTE — Progress Notes (Signed)
Based on what you shared with me, I feel your condition warrants further evaluation and I recommend that you be seen in a face to face office visit. I feel that you will need to have a pelvic exam completed and to have some further testing done such as a wet prep.    NOTE: If you entered your credit card information for this eVisit, you will not be charged. You may see a "hold" on your card for the $35 but that hold will drop off and you will not have a charge processed.   If you are having a true medical emergency please call 911.      For an urgent face to face visit, [place] has five urgent care centers for your convenience:     [facility] Center at [facility] Directions [phone] [address] [place], [place] [address] . 10 am - 6pm Monday - Friday    [facility] Center [facility]) Get Driving Directions [phone] [address] [place], [place] [address] . 10 am to 8 pm Monday-Friday . 12 pm to 8 pm [facility] Urgent Care Center [facility] - [facility]) Get Driving Directions [phone]  [address] [place],  [place]  [address] . 8 am to 8 pm Monday-Friday . 8 am to 4 pm [facility] Urgent Care at [facility] Get Driving Directions [phone] [address] [place], [place] [address] . 8 am to 8 pm Monday-Friday . 9 am to 6 pm Saturday . 11 am to 6 pm Sunday   [facility] Urgent Care at [facility] Get Driving Directions  [phone] [address] [place], [place] [address] . 8 am to 8 pm Monday-Friday . 8 am to 4 pm [facility] Urgent Care at [facility] Directions [phone] [address] [place], [place] [address] . 12 pm to 6 pm Monday-Friday    VIDEO VISITS:  is now providing on-demand video visits for your convenience. Speak to one of our  providers from the comfort of your home 8 am to 8 pm or after hours through our partner vendor.   For Video Visit details and options:   [email or web address]      Your MyChart E-visit questionnaire answers were reviewed by a board certified advanced clinical practitioner to complete your personal care plan based on your specific symptoms.  Thank you for using e-Visits.   Based on what you shared with me, I feel your condition warrants further evaluation and I recommend that you be seen in a face to face office visit.   NOTE: If you entered your credit card information for this eVisit, you will not be charged. You may see a "hold" on your card for the $35 but that hold will drop off and you will not have a charge processed.   If you are having a true medical emergency please call 911.      For an urgent face to face visit,  has five urgent care centers for your convenience:     [facility] Ur[facility] Care Center at [facility] Directions [phone] [address] [place], [place] [address] . 10 am - 6pm Monday - Friday    [facility] Center [facility]) Get Driving Directions [phone] [address] [place], [place] [address] . 10 am to 8 pm Monday-Friday . 12 pm to  8 pm [facility] Urgent [facility]) Get Driving Directions [phone]  [address] [place],  [place]  [address] . 8 am to 8 pm Monday-Friday . 8 am to 4 pm [facility] Urgent Care at [facility] Get Driving Directions [phone] [address] [place], [place] [address] . 8 am to 8 pm Monday-Friday . 9 am to 6 pm Saturday . 11 am to 6 pm Sunday   [facility] Health Urgent Care at [facility] Get Driving Directions  [phone] [address] [place], [place] [address] . 8 am to 8 pm Monday-Friday . 8 am to 4 pm  [facility] Urgent Care at [facility] Directions [phone] [address] [place], [place] [address] . 12 pm to 6 pm Monday-Friday    Your MyChart E-visit questionnaire answers were reviewed by a board certified advanced clinical practitioner to complete your personal care plan based on your specific symptoms.  Thank you for using e-Visits.   Approximately 5 minutes was spent documenting and reviewing patient's chart.

## 2020-10-01 DIAGNOSIS — Z419 Encounter for procedure for purposes other than remedying health state, unspecified: Secondary | ICD-10-CM | POA: Diagnosis not present

## 2020-10-02 ENCOUNTER — Ambulatory Visit: Payer: Medicaid Other | Admitting: Family Medicine

## 2020-10-02 ENCOUNTER — Encounter: Payer: Self-pay | Admitting: Family Medicine

## 2020-10-02 ENCOUNTER — Other Ambulatory Visit: Payer: Self-pay

## 2020-10-02 ENCOUNTER — Encounter: Payer: Medicaid Other | Admitting: Family Medicine

## 2020-10-02 VITALS — BP 131/93 | HR 87 | Ht 63.0 in | Wt 207.0 lb

## 2020-10-02 DIAGNOSIS — Z975 Presence of (intrauterine) contraceptive device: Secondary | ICD-10-CM

## 2020-10-02 NOTE — Progress Notes (Signed)
Patient coming in today for IUD recheck. On physical exam IUD is in place and intact.  Chaperone present. Patient still having some spotting and cramping but is improving.  Should continue to improve.

## 2020-10-03 ENCOUNTER — Ambulatory Visit (INDEPENDENT_AMBULATORY_CARE_PROVIDER_SITE_OTHER): Payer: Medicaid Other | Admitting: Family Medicine

## 2020-10-03 DIAGNOSIS — F419 Anxiety disorder, unspecified: Secondary | ICD-10-CM

## 2020-10-03 DIAGNOSIS — G479 Sleep disorder, unspecified: Secondary | ICD-10-CM

## 2020-10-03 DIAGNOSIS — J301 Allergic rhinitis due to pollen: Secondary | ICD-10-CM

## 2020-10-03 DIAGNOSIS — F1021 Alcohol dependence, in remission: Secondary | ICD-10-CM

## 2020-10-03 DIAGNOSIS — F321 Major depressive disorder, single episode, moderate: Secondary | ICD-10-CM | POA: Diagnosis not present

## 2020-10-03 MED ORDER — CETIRIZINE HCL 10 MG PO TABS: 10.0000 mg | ORAL_TABLET | Freq: Every day | ORAL | 11 refills | Status: DC

## 2020-10-03 MED ORDER — GABAPENTIN 300 MG PO CAPS: 300.0000 mg | ORAL_CAPSULE | Freq: Two times a day (BID) | ORAL | 3 refills | Status: DC

## 2020-10-03 MED ORDER — AZELASTINE HCL 0.1 % NA SOLN: 1.0000 | Freq: Two times a day (BID) | NASAL | 12 refills | Status: DC

## 2020-10-03 MED ORDER — ESCITALOPRAM OXALATE 20 MG PO TABS: 20.0000 mg | ORAL_TABLET | Freq: Every day | ORAL | 5 refills | Status: DC

## 2020-10-03 MED ORDER — TRAZODONE HCL 50 MG PO TABS: 25.0000 mg | ORAL_TABLET | Freq: Every evening | ORAL | 3 refills | Status: DC | PRN

## 2020-10-03 MED ORDER — BUPROPION HCL ER (XL) 300 MG PO TB24
300.0000 mg | ORAL_TABLET | Freq: Every day | ORAL | 5 refills | Status: DC
Start: 2020-10-03 — End: 2021-03-12

## 2020-10-03 NOTE — Progress Notes (Signed)
Telephone visit  Subjective: CC:f/u GAD/ Depression PCP: Raliegh Ip, DO WEX:HBZJIR M Burrill is a 40 y.o. female calls for telephone consult today. Patient provides verbal consent for consult held via phone.  Due to COVID-19 pandemic this visit was conducted virtually. This visit type was conducted due to national recommendations for restrictions regarding the COVID-19 Pandemic (e.g. social distancing, sheltering in place) in an effort to limit this patient's exposure and mitigate transmission in our community. All issues noted in this document were discussed and addressed.  A physical exam was not performed with this format.   Location of patient: home Location of provider: [facility] Others present for call: none  1.  Depression/ anxiety Patient was last seen in March.  She was given as needed Atarax for anxiety as well as Wellbutrin added for depressive symptoms.  She was continued on Lexapro. Depression waxes and wanes in severity.  Sometimes wants to sleep but sometimes she is "happy go lucky".  She reports having gained weight since our last visit.  Has only needed Atarax a handful of times and it was very helpful.  2.  Allergic rhinitis Patient reports rhinorrhea, postnasal drip which precipitates cough when she lays down at bedtime.  No fevers, hemoptysis or wheezing reported.  She tried multiple over-the-counter medications but none of this has been helpful.   ROS: Per HPI  Allergies  Allergen Reactions  . Penicillins     Hives and itching  . Sulfa Antibiotics     Hives and itching    Past Medical History:  Diagnosis Date  . Anxiety   . Arthritis   . Depression   . Endometriosis   . Headache   . Hx of tubal ligation 2008  . Hypertension   . Infection    UTI    Current Outpatient Medications:  .  amLODipine (NORVASC) 10 MG tablet, Take 1 tablet (10 mg total) by mouth daily., Disp: 90 tablet, Rfl: 1 .  baclofen (LIORESAL) 10 MG tablet, Take 1 tablet (10 mg total)  by mouth 3 (three) times daily., Disp: 30 each, Rfl: 0 .  buPROPion (WELLBUTRIN XL) 150 MG 24 hr tablet, Take 1 tablet (150 mg total) by mouth daily., Disp: 30 tablet, Rfl: 2 .  clindamycin (CLEOCIN) 300 MG capsule, Take by mouth., Disp: , Rfl:  .  escitalopram (LEXAPRO) 20 MG tablet, Take 1 tablet (20 mg total) by mouth daily., Disp: 30 tablet, Rfl: 5 .  famotidine (PEPCID) 20 MG tablet, Take 20 mg by mouth daily. , Disp: , Rfl:  .  gabapentin (NEURONTIN) 300 MG capsule, Take 1 capsule (300 mg total) by mouth 2 (two) times daily., Disp: 60 capsule, Rfl: 3 .  hydrOXYzine (ATARAX/VISTARIL) 50 MG tablet, Take 0.5-1 tablets (25-50 mg total) by mouth every 8 (eight) hours as needed for anxiety., Disp: 30 tablet, Rfl: 0 .  ibuprofen (ADVIL) 600 MG tablet, Take 600 mg by mouth 4 (four) times daily as needed., Disp: , Rfl:  .  metoprolol succinate (TOPROL-XL) 25 MG 24 hr tablet, Take 0.5 tablets (12.5 mg total) by mouth daily., Disp: 45 tablet, Rfl: 3 .  metroNIDAZOLE (FLAGYL) 500 MG tablet, Take 1 tablet (500 mg total) by mouth 2 (two) times daily., Disp: 14 tablet, Rfl: 0 .  metroNIDAZOLE (METROGEL VAGINAL) 0.75 % vaginal gel, Use 1 applicatorful vaginally immediately after intercourse as needed., Disp: 70 g, Rfl: 1 .  naproxen (NAPROSYN) 500 MG tablet, Take 1 tablet (500 mg total) by mouth 2 (two) times  daily with a meal., Disp: 30 tablet, Rfl: 0 .  traZODone (DESYREL) 50 MG tablet, Take 0.5-1 tablets (25-50 mg total) by mouth at bedtime as needed for sleep., Disp: 30 tablet, Rfl: 3  Current Facility-Administered Medications:  .  levonorgestrel (MIRENA) 20 MCG/24HR IUD 1 each, 1 each, Intrauterine, Once, Dettinger, Elige Radon, MD   Depression screen [facility] 2/9 10/02/2020 09/01/2020 08/23/2020  Decreased Interest 2 2 2   Down, Depressed, Hopeless 3 3 3   PHQ - 2 Score 5 5 5   Altered sleeping - - 1  Tired, decreased energy - - 2  Change in appetite - - 2  Feeling bad or failure about yourself  - - 1  Trouble  concentrating - - 2  Moving slowly or fidgety/restless - - 0  Suicidal thoughts - - 0  PHQ-9 Score - - 13  Difficult doing work/chores - - -  Some recent data might be hidden   GAD 7 : Generalized Anxiety Score 08/23/2020 06/14/2020 06/08/2020 01/06/2020  Nervous, Anxious, on Edge 2 3 1  0  Control/stop worrying 2 3 2  0  Worry too much - different things 3 3 2  0  Trouble relaxing 2 1 1  0  Restless 1 1 1  0  Easily annoyed or irritable 1 3 2  0  Afraid - awful might happen 0 0 0 0  Total GAD 7 Score 11 14 9  0  Anxiety Difficulty - Somewhat difficult Somewhat difficult -    Assessment/ Plan: 40 y.o. female   Depression, major, single episode, moderate (HCC) - Plan: buPROPion (WELLBUTRIN XL) 300 MG 24 hr tablet  Alcoholism in recovery (HCC) - Plan: gabapentin (NEURONTIN) 300 MG capsule  Sleep difficulties - Plan: traZODone (DESYREL) 50 MG tablet  Anxiety - Plan: escitalopram (LEXAPRO) 20 MG tablet  Seasonal allergic rhinitis due to pollen - Plan: azelastine (ASTELIN) 0.1 % nasal spray, cetirizine (ZYRTEC) 10 MG tablet  Depression improving on Wellbutrin but we still has some room to go stop going to advance to 300 mg daily.  She continues to maintain sobriety with gabapentin.  I do question if this medication is what is in fact increasing some of her weight.  Wellbutrin seems to be better at weight reduction if anything.  We will reevaluate her in the office in 6 weeks to determine if other medications may be warranted given weight gain.  Lexapro has been continued.  She will continue as needed Atarax and trazodone  Astelin nasal spray and Zyrtec sent for allergy symptoms.  She will follow-up if any of the symptoms change and are more suggestive of infection.  Start time: 12:34pm (LVM); 12:43pm (LVM); 1:00pm End time: 1:11pm  Total time spent on patient care (including telephone call/ virtual visit): 11 minutes  Tenessa Marsee 08/06/2020, DO [facility] [phone]

## 2020-10-04 ENCOUNTER — Encounter: Payer: Self-pay | Admitting: Family Medicine

## 2020-10-06 ENCOUNTER — Other Ambulatory Visit: Payer: Self-pay | Admitting: Family Medicine

## 2020-10-06 DIAGNOSIS — R600 Localized edema: Secondary | ICD-10-CM

## 2020-10-06 MED ORDER — HYDROCHLOROTHIAZIDE 12.5 MG PO TABS: 12.5000 mg | ORAL_TABLET | Freq: Every day | ORAL | 3 refills | Status: DC | PRN

## 2020-10-31 ENCOUNTER — Telehealth: Payer: Medicaid Other | Admitting: Physician Assistant

## 2020-10-31 DIAGNOSIS — M545 Low back pain, unspecified: Secondary | ICD-10-CM

## 2020-10-31 NOTE — Progress Notes (Signed)
Based on what you shared with me, I feel your condition warrants further evaluation and I recommend that you be seen in a face to face office visit.   If the medications prescribed previously are not helping, you will need a face to face exam to determine what the next step of treatment should be.   NOTE: If you entered your credit card information for this eVisit, you will not be charged. You may see a "hold" on your card for the $35 but that hold will drop off and you will not have a charge processed.   If you are having a true medical emergency please call 911.      For an urgent face to face visit, [place] has six urgent care centers for your convenience:     [facility] Urgent Care Center at [facility] Directions [phone] [address] [place], [place] [address] . 8 am - 4 pm Monday - Friday    [facility] Center [facility]) Get Driving Directions [phone] [address] [place], [place] [address] . 8 am to 8 pm Monday-Friday . 10 am to 6 pm [facility] Urgent [facility]) Get Driving Directions [phone]  [address] [place],  [place]  [address] . 8 am to 8 pm Monday-Friday . 8 am to 4 pm [facility] Urgent Care at [facility] Get Driving Directions [phone] [address] [place] [address] [place], [place] [address] . 8 am to 8 pm Monday-Friday . 8 am to 4 pm [facility] Urgent Care at [facility] Get Driving Directions  [phone] [address] [place], [place] [address] . 8 am to 8 pm Monday-Friday . 8 am to 4 pm [facility] Urgent Care at [facility] Directions [phone] [address] [place], [place] [address] . 8 am to 8 pm Monday-Friday . 8 am to 4 pm Saturday-Sunday     Your MyChart E-visit questionnaire answers were reviewed by a board  certified advanced clinical practitioner to complete your personal care plan based on your specific symptoms.  Thank you for using e-Visits.    Greater than 5 minutes, yet less than 10 minutes of time have been spent researching, coordinating, and implementing care for this patient today

## 2020-11-01 DIAGNOSIS — Z419 Encounter for procedure for purposes other than remedying health state, unspecified: Secondary | ICD-10-CM | POA: Diagnosis not present

## 2020-11-03 ENCOUNTER — Other Ambulatory Visit: Payer: Self-pay | Admitting: Family Medicine

## 2020-11-03 DIAGNOSIS — F419 Anxiety disorder, unspecified: Secondary | ICD-10-CM

## 2020-11-22 DIAGNOSIS — R0789 Other chest pain: Secondary | ICD-10-CM | POA: Diagnosis not present

## 2020-11-22 DIAGNOSIS — S29011A Strain of muscle and tendon of front wall of thorax, initial encounter: Secondary | ICD-10-CM | POA: Diagnosis not present

## 2020-11-22 DIAGNOSIS — Z6836 Body mass index (BMI) 36.0-36.9, adult: Secondary | ICD-10-CM | POA: Diagnosis not present

## 2020-11-22 DIAGNOSIS — F419 Anxiety disorder, unspecified: Secondary | ICD-10-CM | POA: Diagnosis not present

## 2020-11-23 ENCOUNTER — Encounter: Payer: Self-pay | Admitting: Family Medicine

## 2020-11-23 ENCOUNTER — Ambulatory Visit (INDEPENDENT_AMBULATORY_CARE_PROVIDER_SITE_OTHER): Payer: Medicaid Other | Admitting: Family Medicine

## 2020-11-23 ENCOUNTER — Other Ambulatory Visit: Payer: Self-pay

## 2020-11-23 VITALS — BP 127/86 | HR 76 | Temp 97.4°F | Ht 63.0 in | Wt 208.4 lb

## 2020-11-23 DIAGNOSIS — F411 Generalized anxiety disorder: Secondary | ICD-10-CM | POA: Diagnosis not present

## 2020-11-23 DIAGNOSIS — R61 Generalized hyperhidrosis: Secondary | ICD-10-CM | POA: Diagnosis not present

## 2020-11-23 DIAGNOSIS — F321 Major depressive disorder, single episode, moderate: Secondary | ICD-10-CM | POA: Diagnosis not present

## 2020-11-23 DIAGNOSIS — F1021 Alcohol dependence, in remission: Secondary | ICD-10-CM | POA: Diagnosis not present

## 2020-11-23 MED ORDER — BUSPIRONE HCL 5 MG PO TABS: ORAL_TABLET | ORAL | 0 refills | Status: DC

## 2020-11-23 NOTE — Progress Notes (Signed)
Subjective: CC: 6-week follow-up for depression/anxiety PCP: Janora Norlander, DO GYF:VCBSWH [name] is a 40 y.o. female presenting to clinic today for:  1.  Depression/anxiety Patient was seen via telephone visit about 6 weeks ago.  Depression at that time was waxing and waning in severity despite use of Lexapro and Wellbutrin.  She had reported at that time that Atarax was helpful for anxiety but not too much for sleep.  So we added trazodone and advanced Wellbutrin to 300 mg daily.  She notes that symptoms really have persisted and in fact she seems to be having more anxiety.  This has been refractory to her Atarax and she notes an episode recently where she had left-sided chest pain and was ultimately evaluated at the urgent care.  They did not feel to be cardiac in nature but in fact a panic attack.  She was apparently given a Toradol injection in efforts to help symptoms.  She wants to know if Ativan might be an option for her.  She has remained in recovery from alcoholism for 6 months now.  She continues on gabapentin.   ROS: Per HPI  Allergies  Allergen Reactions   Penicillins     Hives and itching   Sulfa Antibiotics     Hives and itching    Past Medical History:  Diagnosis Date   Anxiety    Arthritis    Depression    Endometriosis    Headache    Hx of tubal ligation 2008   Hypertension    Infection    UTI    Current Outpatient Medications:    amLODipine (NORVASC) 10 MG tablet, Take 1 tablet (10 mg total) by mouth daily., Disp: 90 tablet, Rfl: 1   azelastine (ASTELIN) 0.1 % nasal spray, Place 1 spray into both nostrils 2 (two) times daily., Disp: 30 mL, Rfl: 12   baclofen (LIORESAL) 10 MG tablet, Take 1 tablet (10 mg total) by mouth 3 (three) times daily., Disp: 30 each, Rfl: 0   buPROPion (WELLBUTRIN XL) 300 MG 24 hr tablet, Take 1 tablet (300 mg total) by mouth daily., Disp: 30 tablet, Rfl: 5   cetirizine (ZYRTEC) 10 MG tablet, Take 1 tablet (10 mg total) by  mouth daily., Disp: 30 tablet, Rfl: 11   clindamycin (CLEOCIN) 300 MG capsule, Take by mouth., Disp: , Rfl:    escitalopram (LEXAPRO) 20 MG tablet, Take 1 tablet (20 mg total) by mouth daily., Disp: 30 tablet, Rfl: 5   famotidine (PEPCID) 20 MG tablet, Take 20 mg by mouth daily. , Disp: , Rfl:    gabapentin (NEURONTIN) 300 MG capsule, Take 1 capsule (300 mg total) by mouth 2 (two) times daily., Disp: 60 capsule, Rfl: 3   hydrochlorothiazide (HYDRODIURIL) 12.5 MG tablet, Take 1 tablet (12.5 mg total) by mouth daily as needed (swelling/ edema)., Disp: 90 tablet, Rfl: 3   hydrOXYzine (ATARAX/VISTARIL) 50 MG tablet, TAKE 1/2 TO 1 (ONE-HALF TO ONE) TABLET BY MOUTH EVERY 8 HOURS AS NEEDED FOR ANXIETY, Disp: 30 tablet, Rfl: 0   ibuprofen (ADVIL) 600 MG tablet, Take 600 mg by mouth 4 (four) times daily as needed., Disp: , Rfl:    metoprolol succinate (TOPROL-XL) 25 MG 24 hr tablet, Take 0.5 tablets (12.5 mg total) by mouth daily., Disp: 45 tablet, Rfl: 3   metroNIDAZOLE (METROGEL VAGINAL) 0.75 % vaginal gel, Use 1 applicatorful vaginally immediately after intercourse as needed., Disp: 70 g, Rfl: 1   naproxen (NAPROSYN) 500 MG tablet, Take 1  tablet (500 mg total) by mouth 2 (two) times daily with a meal., Disp: 30 tablet, Rfl: 0   traZODone (DESYREL) 50 MG tablet, Take 0.5-1 tablets (25-50 mg total) by mouth at bedtime as needed for sleep., Disp: 30 tablet, Rfl: 3  Current Facility-Administered Medications:    levonorgestrel (MIRENA) 20 MCG/24HR IUD 1 each, 1 each, Intrauterine, Once, Dettinger, Fransisca Kaufmann, MD Social History   Socioeconomic History   Marital status: Divorced    Spouse name: Not on file   Number of children: Not on file   Years of education: Not on file   Highest education level: Not on file  Occupational History   Occupation: CMA  Tobacco Use   Smoking status: Former    Packs/day: 1.00    Years: 5.00    Pack years: 5.00    Types: Cigarettes    Start date: 05/29/2006    Quit  date: 01/07/2020    Years since quitting: 0.8   Smokeless tobacco: Never  Vaping Use   Vaping Use: Some days  Substance and Sexual Activity   Alcohol use: Yes    Comment: occasional   Drug use: No   Sexual activity: Yes    Birth control/protection: Injection    Comment: BTL  Other Topics Concern   Not on file  Social History Narrative   Not on file   Social Determinants of Health   Financial Resource Strain: Not on file  Food Insecurity: No Food Insecurity   Worried About Running Out of Food in the Last Year: Never true   Ran Out of Food in the Last Year: Never true  Transportation Needs: No Transportation Needs   Lack of Transportation (Medical): No   Lack of Transportation (Non-Medical): No  Physical Activity: Not on file  Stress: Not on file  Social Connections: Not on file  Intimate Partner Violence: Not on file   Family History  Problem Relation Age of Onset   Depression Mother    Stroke Mother    Diabetes Father    Hypertension Father    Stroke Father    Heart attack Father 71   Hypertension Sister    Hypertension Brother    Heart attack Brother 67       x3 MI (twin bro)   CAD Brother    Asthma Daughter    Breast cancer Paternal Grandmother 44   Hypertension Brother    Healthy Daughter     Objective: Office vital signs reviewed. BP 127/86   Pulse 76   Temp (!) 97.4 F (36.3 C)   Ht 5' 3"  (1.6 Caitlin)   Wt 208 lb 6.4 oz (94.5 kg)   SpO2 98%   BMI 36.92 kg/Caitlin   Physical Examination:  General: Awake, alert, well nourished, No acute distress HEENT: Normal, sclera white, MMM, no goiter Cardio: regular rate and rhythm, S1S2 heard, no murmurs appreciated Pulm: clear to auscultation bilaterally, no wheezes, rhonchi or rales; normal work of breathing on room air Extremities: warm, well perfused, No edema, cyanosis or clubbing; +2 pulses bilaterally Psych: mood stable, good eye contact, thought process linear.  Depression screen [facility] 2/9 11/23/2020 10/02/2020  09/01/2020  Decreased Interest 1 2 2   Down, Depressed, Hopeless 1 3 3   PHQ - 2 Score 2 5 5   Altered sleeping 3 - -  Tired, decreased energy 3 - -  Change in appetite 2 - -  Feeling bad or failure about yourself  0 - -  Trouble concentrating - - -  Moving slowly or fidgety/restless 3 - -  Suicidal thoughts 0 - -  PHQ-9 Score 13 - -  Difficult doing work/chores Somewhat difficult - -  Some recent data might be hidden   GAD 7 : Generalized Anxiety Score 11/23/2020 08/23/2020 06/14/2020 06/08/2020  Nervous, Anxious, on Edge 3 2 3 1   Control/stop worrying 3 2 3 2   Worry too much - different things 3 3 3 2   Trouble relaxing 3 2 1 1   Restless 1 1 1 1   Easily annoyed or irritable 3 1 3 2   Afraid - awful might happen 0 0 0 0  Total GAD 7 Score 16 11 14 9   Anxiety Difficulty Somewhat difficult - Somewhat difficult Somewhat difficult     Assessment/ Plan: 40 y.o. female   Generalized anxiety disorder - Plan: busPIRone (BUSPAR) 5 MG tablet  Depression, major, single episode, moderate (HCC) - Plan: busPIRone (BUSPAR) 5 MG tablet  Alcoholism in recovery (HCC)  Night sweats - Plan: TSH, T4, Free, CMP14+EGFR, CBC, FSH/LH  Anxiety disorder and depression are not really well controlled current therapies of Lexapro and Wellbutrin.  Subjectively she feels that they are controlled and really that her anxiety is the most prominent issue.  I have placed her on buspirone and we will gradually increase this dose until we find a therapeutic level.  We discussed max dose 30 mg twice daily.  We initially discussed reconvening in 4 weeks and adjust dose if needed at that time but she would actually like to go ahead and make that appointment with Delight Ovens to go ahead and get the genetic testing done to make sure that we got her on the most effective medication.  I think this is reasonable and I will [place].  If she seems to be responding some to the buspirone but needs an advance dose I think it  is fine to increase her regimen to 15 mg twice daily and have her follow-up with me.  We discussed the limitations of benzodiazepines given her history of alcoholism.  I do worry that that would be a Gateway to falling off the wagon.  Want to do anything we can to maintain her sobriety.  She is now been 6 months sober.  She will continue her gabapentin.  With regards to her night sweats, uncertain if this is physical manifestations of uncontrolled anxiety and panic but we will certainly rule out any other metabolic causes including early menopause which her mother went through.  She has a Mirena is a difficulty telling when LMP was.  No orders of the defined types were placed in this encounter.  No orders of the defined types were placed in this encounter.    Janora Norlander, DO [place] [phone]

## 2020-11-23 NOTE — Patient Instructions (Addendum)
If you are not seeing improvement in your symptoms in the next 3 to 4 weeks with the BuSpar, I would like you to see Shon Hale, NP, here in the office for genetic testing.  Want to make sure that we are choosing the right medications for you.  We discussed the risks of benzodiazepines given your history of alcoholism.  I would really worry about it being a Gateway back into drinking.  I do not want to do anything that would compromise your sobriety.  We will check a few lab values to evaluate metabolic reasons for your night sweats.  The night sweats may in fact be manifestations of anxiety and panic.

## 2020-11-24 LAB — CBC
Hematocrit: 39.9 % (ref 34.0–46.6)
Hemoglobin: 13.5 g/dL (ref 11.1–15.9)
MCH: 29.2 pg (ref 26.6–33.0)
MCHC: 33.8 g/dL (ref 31.5–35.7)
MCV: 86 fL (ref 79–97)
Platelets: 280 10*3/uL (ref 150–450)
RBC: 4.63 x10E6/uL (ref 3.77–5.28)
RDW: 13.1 % (ref 11.7–15.4)
WBC: 8.1 10*3/uL (ref 3.4–10.8)

## 2020-11-24 LAB — CMP14+EGFR
ALT: 13 IU/L (ref 0–32)
AST: 8 IU/L (ref 0–40)
Albumin/Globulin Ratio: 2.4 — ABNORMAL HIGH (ref 1.2–2.2)
Albumin: 4.3 g/dL (ref 3.8–4.8)
Alkaline Phosphatase: 67 IU/L (ref 44–121)
BUN/Creatinine Ratio: 14 (ref 9–23)
BUN: 14 mg/dL (ref 6–20)
Bilirubin Total: <0.2 mg/dL (ref 0.0–1.2)
CO2: 21 mmol/L (ref 20–29)
Calcium: 9.2 mg/dL (ref 8.7–10.2)
Chloride: 105 mmol/L (ref 96–106)
Creatinine, Ser: 1.01 mg/dL — ABNORMAL HIGH (ref 0.57–1.00)
Globulin, Total: 1.8 g/dL (ref 1.5–4.5)
Glucose: 90 mg/dL (ref 65–99)
Potassium: 4.4 mmol/L (ref 3.5–5.2)
Sodium: 141 mmol/L (ref 134–144)
Total Protein: 6.1 g/dL (ref 6.0–8.5)
eGFR: 74 mL/min/{1.73_m2} (ref >59)

## 2020-11-24 LAB — FSH/LH
FSH: 11.8 m[IU]/mL
LH: 8.8 m[IU]/mL

## 2020-11-24 LAB — T4, FREE: Free T4: 1.14 ng/dL (ref 0.82–1.77)

## 2020-11-24 LAB — TSH: TSH: 1.14 u[IU]/mL (ref 0.450–4.500)

## 2020-11-27 ENCOUNTER — Encounter: Payer: Self-pay | Admitting: Family Medicine

## 2020-11-27 ENCOUNTER — Other Ambulatory Visit: Payer: Self-pay | Admitting: Family Medicine

## 2020-11-27 DIAGNOSIS — F321 Major depressive disorder, single episode, moderate: Secondary | ICD-10-CM

## 2020-11-27 DIAGNOSIS — F1021 Alcohol dependence, in remission: Secondary | ICD-10-CM

## 2020-11-27 DIAGNOSIS — F411 Generalized anxiety disorder: Secondary | ICD-10-CM

## 2020-11-29 ENCOUNTER — Telehealth (INDEPENDENT_AMBULATORY_CARE_PROVIDER_SITE_OTHER): Payer: Medicaid Other | Admitting: Licensed Clinical Social Worker

## 2020-11-29 ENCOUNTER — Telehealth: Payer: Self-pay | Admitting: Family Medicine

## 2020-11-29 NOTE — Telephone Encounter (Signed)
Left generic message encouraging contact 

## 2020-12-01 DIAGNOSIS — Z419 Encounter for procedure for purposes other than remedying health state, unspecified: Secondary | ICD-10-CM | POA: Diagnosis not present

## 2020-12-05 ENCOUNTER — Telehealth: Payer: Self-pay | Admitting: Licensed Clinical Social Worker

## 2020-12-05 NOTE — Telephone Encounter (Signed)
Left message encouraging call back °

## 2020-12-07 ENCOUNTER — Encounter: Payer: Self-pay | Admitting: Family Medicine

## 2020-12-11 DIAGNOSIS — F419 Anxiety disorder, unspecified: Secondary | ICD-10-CM | POA: Diagnosis not present

## 2020-12-11 DIAGNOSIS — I10 Essential (primary) hypertension: Secondary | ICD-10-CM | POA: Diagnosis not present

## 2020-12-11 DIAGNOSIS — F17210 Nicotine dependence, cigarettes, uncomplicated: Secondary | ICD-10-CM | POA: Diagnosis not present

## 2020-12-12 ENCOUNTER — Encounter: Payer: Self-pay | Admitting: Family Medicine

## 2020-12-12 IMAGING — US US OB < 14 WEEKS - US OB TV
1 series · 13 of 28 positions shown · non-contrast
Comparison: None.

CLINICAL DATA: Cramping and vaginal bleeding. Quantitative beta HCG
871. Previous tubal ligation 5991. LMP 10/16/2019.

EXAM:
OBSTETRIC <14 WK US AND TRANSVAGINAL OB US
TECHNIQUE: Both transabdominal and transvaginal ultrasound examinations were
performed for complete evaluation of the gestation as well as the
maternal uterus, adnexal regions, and pelvic cul-de-sac.
Transvaginal technique was performed to assess early pregnancy.

[Series 1: us pelvis (transabdominal only) · 13 of 144 slices shown]
[im 6/144]
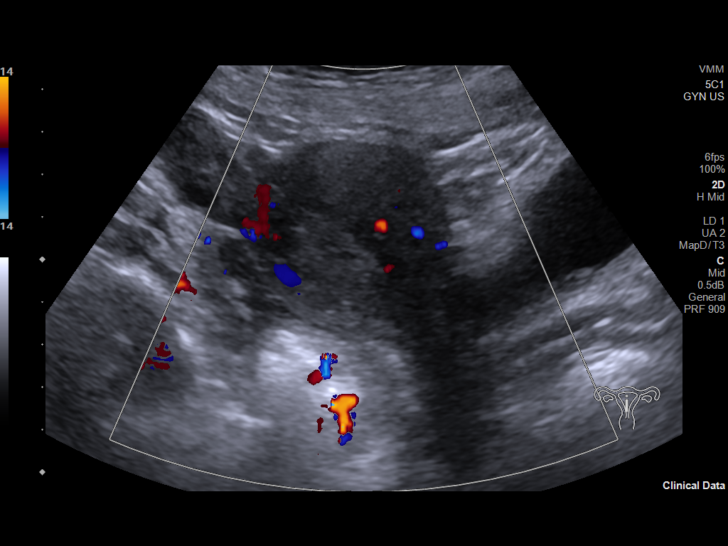
[im 16/144]
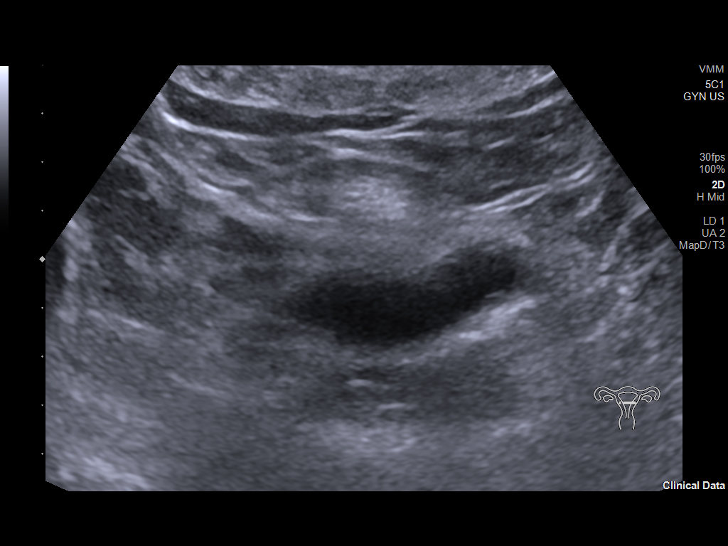
[im 27/144]
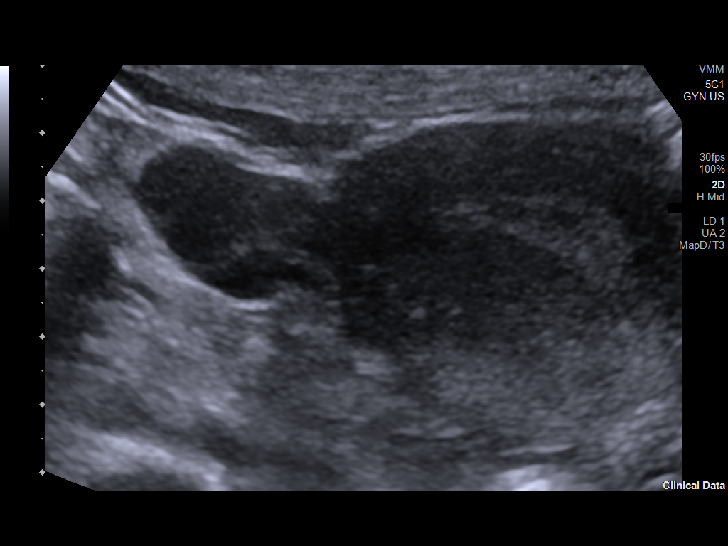
[im 38/144]
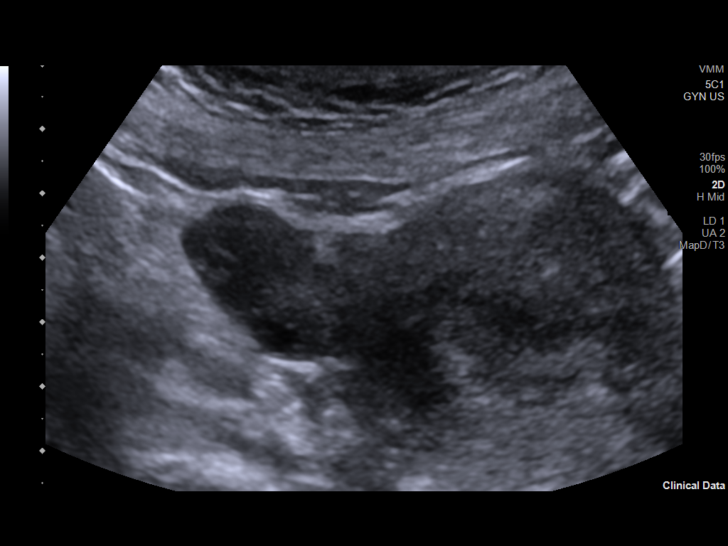
[im 48/144]
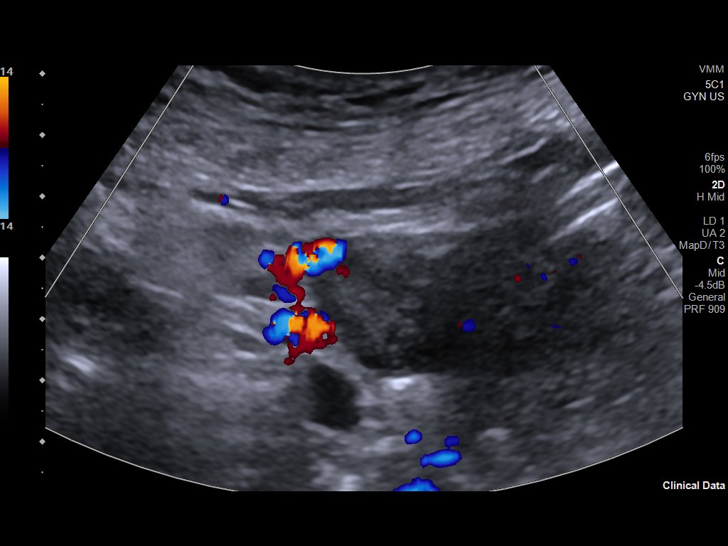
[im 59/144]
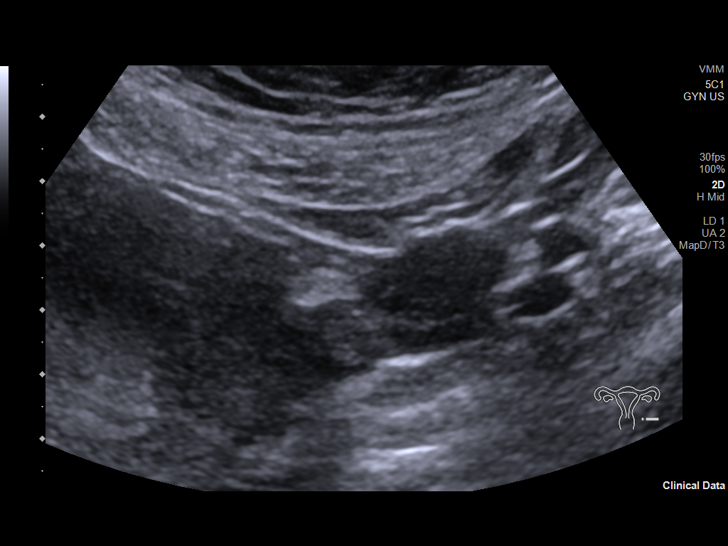
[im 75/144]
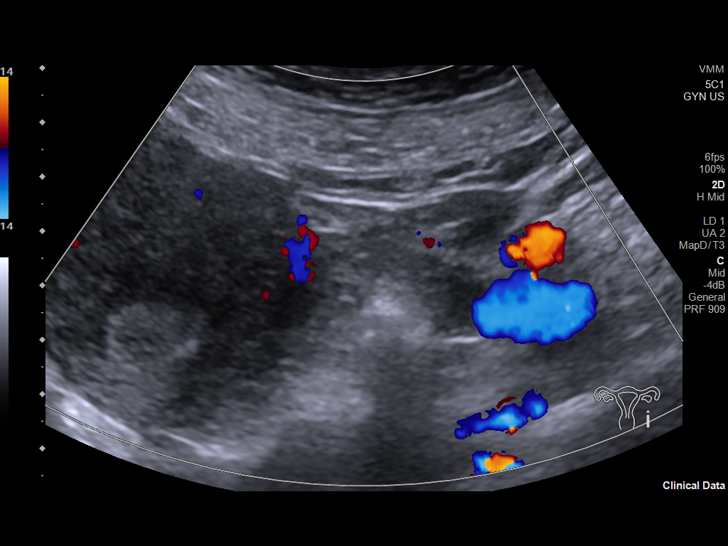
[im 85/144]
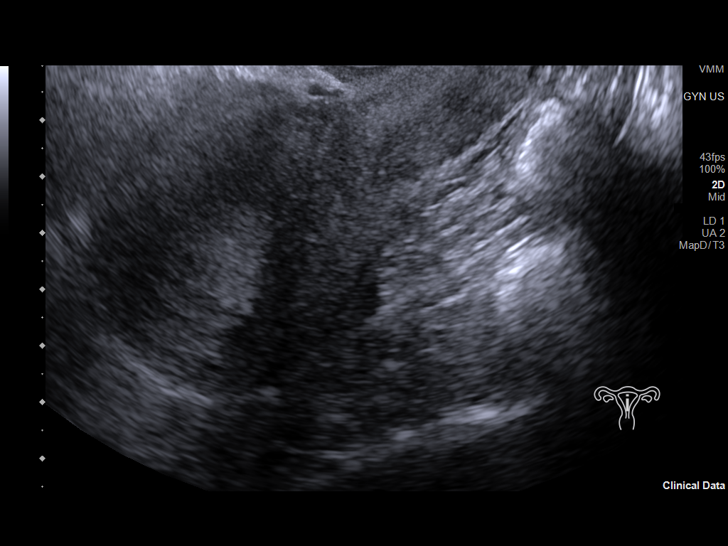
[im 96/144]
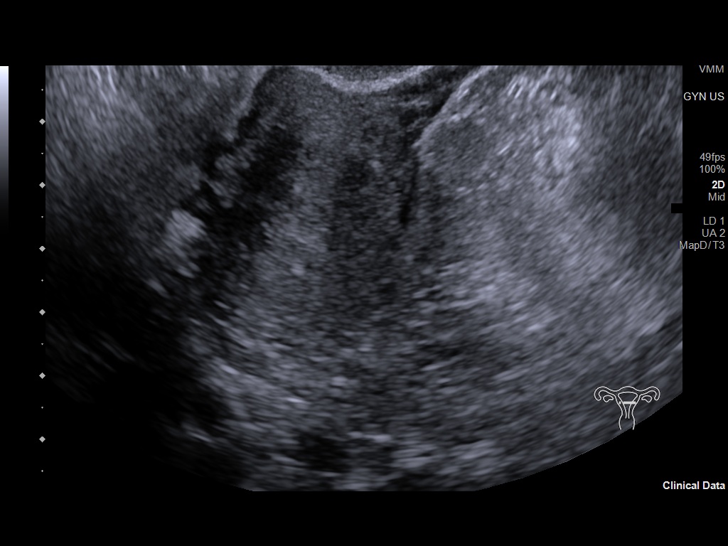
[im 106/144]
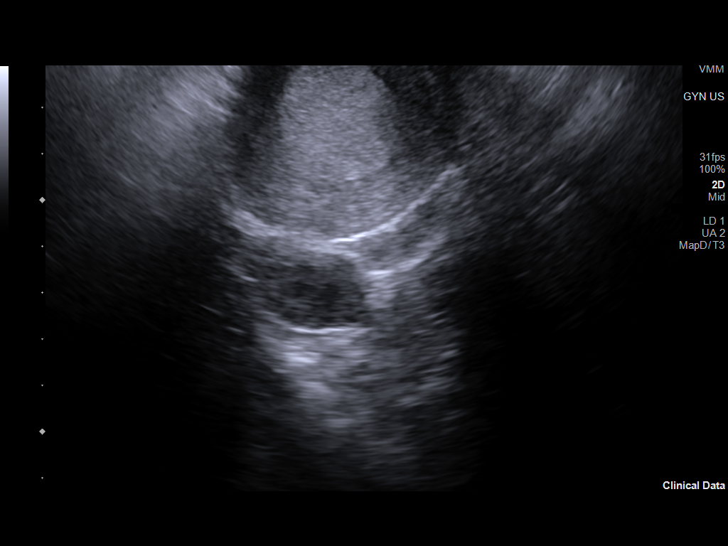
[im 117/144]
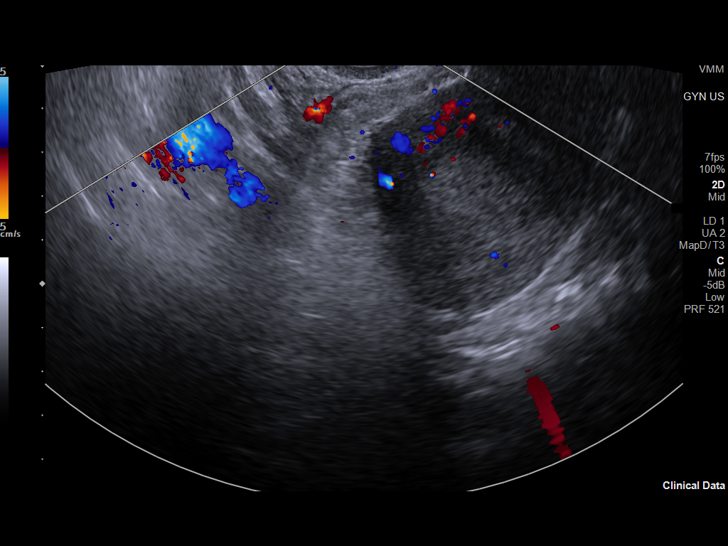
[im 128/144]
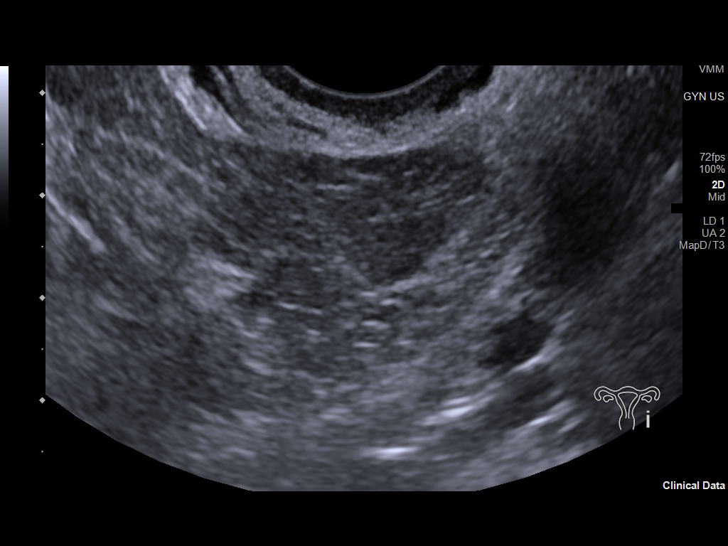
[im 138/144]
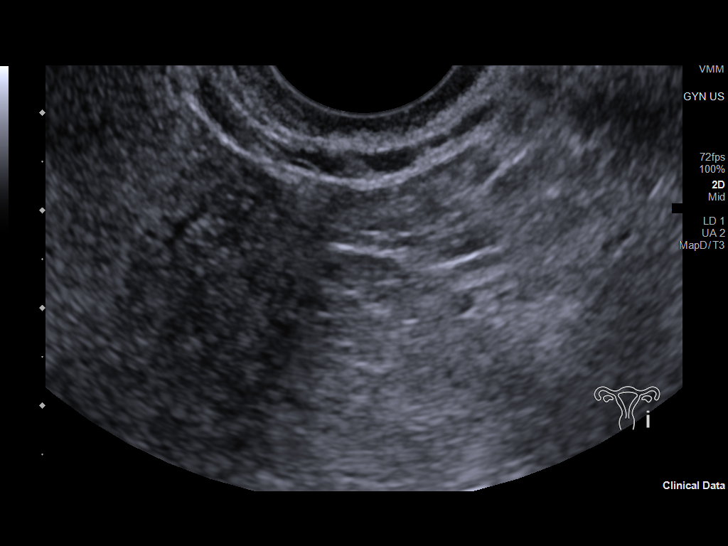

[13 of 28 positions shown; findings below may reference images not displayed]

FINDINGS: Intrauterine gestational sac: Not visualized.

Yolk sac:  Not visualized.

Embryo:  Not visualized.

Cardiac Activity: Not visualized.

Heart Rate: Not visualized.  Bpm

MSD: Not visualized.

CRL:  Not visualized.

Subchorionic hemorrhage:  None visualized.

Maternal uterus/adnexae: Uterus is normal size, shape and position.
Endometrium is within normal measuring 10-14 mm. Ovaries are normal
size, shape and position with normal color Doppler. No free pelvic
fluid.
IMPRESSION: No intrauterine gestational sac, yolk sac, fetal pole, or cardiac
activity visualized. Differential considerations include
intrauterine gestation too early to be sonographically visualized,
spontaneous abortion, or ectopic pregnancy. Consider follow-up
ultrasound in 14 days and serial quantitative beta HCG follow-up.

## 2020-12-15 ENCOUNTER — Other Ambulatory Visit: Payer: Self-pay

## 2020-12-15 ENCOUNTER — Encounter: Payer: Self-pay | Admitting: Family Medicine

## 2020-12-15 ENCOUNTER — Ambulatory Visit (INDEPENDENT_AMBULATORY_CARE_PROVIDER_SITE_OTHER): Payer: Medicaid Other | Admitting: Family Medicine

## 2020-12-15 VITALS — BP 126/90 | HR 80 | Temp 97.4°F | Ht 63.0 in | Wt 205.8 lb

## 2020-12-15 DIAGNOSIS — F1021 Alcohol dependence, in remission: Secondary | ICD-10-CM

## 2020-12-15 DIAGNOSIS — K219 Gastro-esophageal reflux disease without esophagitis: Secondary | ICD-10-CM | POA: Diagnosis not present

## 2020-12-15 DIAGNOSIS — F410 Panic disorder [episodic paroxysmal anxiety] without agoraphobia: Secondary | ICD-10-CM | POA: Diagnosis not present

## 2020-12-15 DIAGNOSIS — F321 Major depressive disorder, single episode, moderate: Secondary | ICD-10-CM | POA: Diagnosis not present

## 2020-12-15 IMAGING — US US OB TRANSVAGINAL
1 series · 16 of 28 positions shown · non-contrast
Comparison: Three days ago

CLINICAL DATA: Rule out ectopic pregnancy. History of tubal
ligation.

EXAM:
TRANSVAGINAL OB ULTRASOUND
TECHNIQUE: Transvaginal ultrasound was performed for complete evaluation of the
gestation as well as the maternal uterus, adnexal regions, and
pelvic cul-de-sac.

[Series 1: us ob transvaginal · 31 acquisitions, 16 frames shown]
[im 1/31]
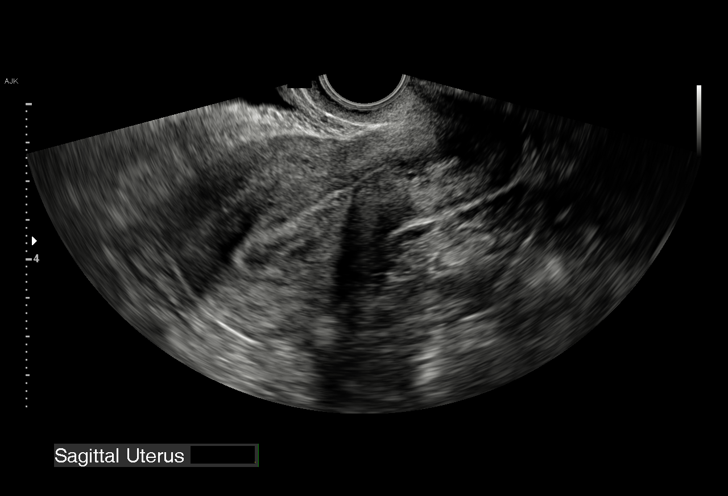
[im 3/31]
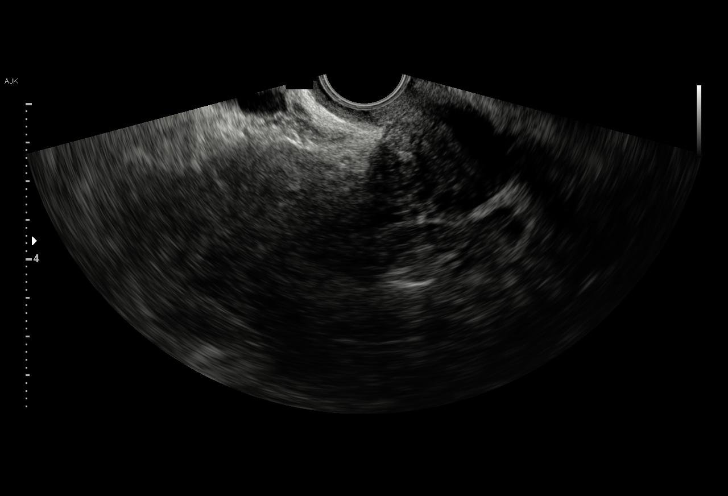
[im 5/31]
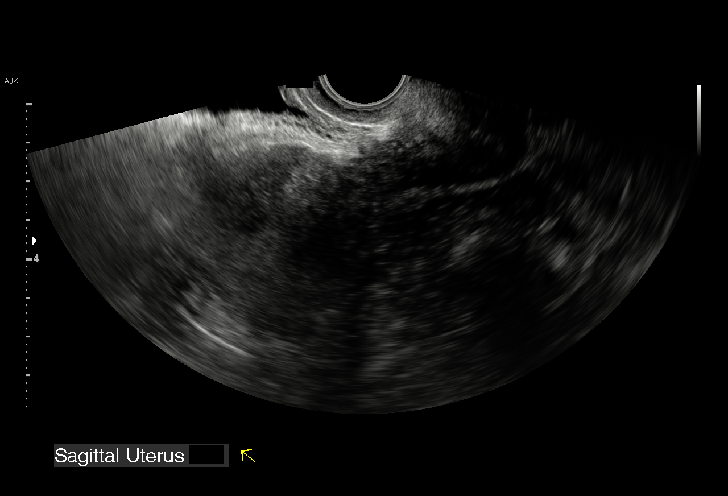
[im 7/31]
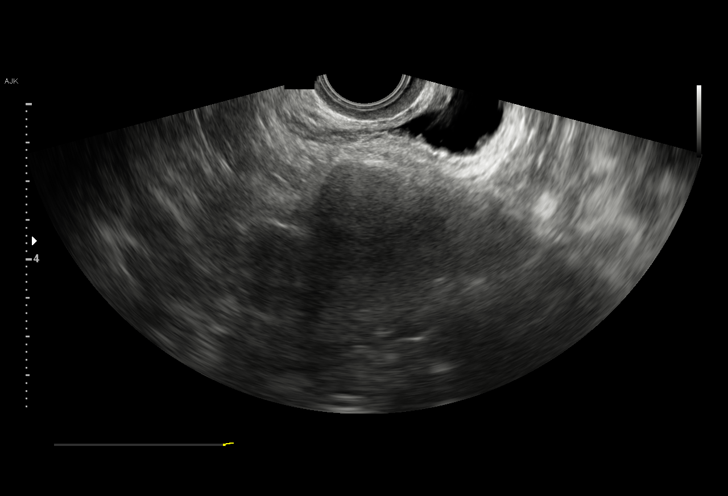
[im 8/31]
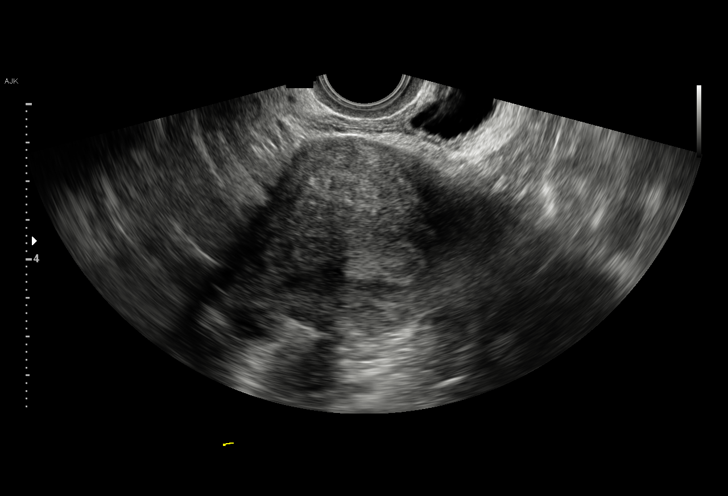
[im 11/31]
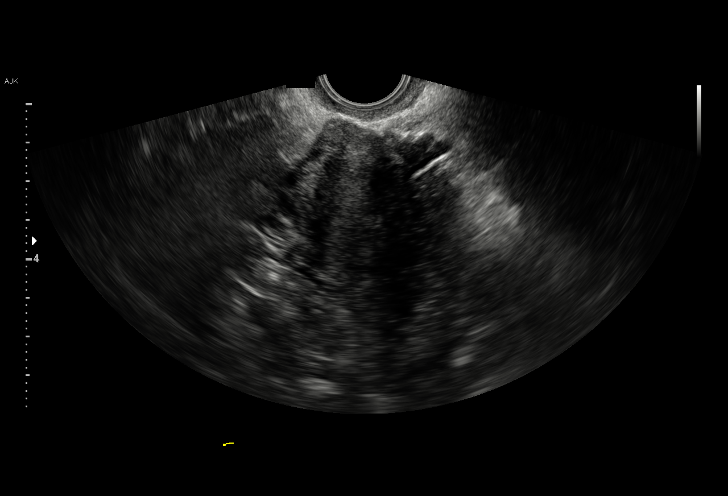
[im 13/31]
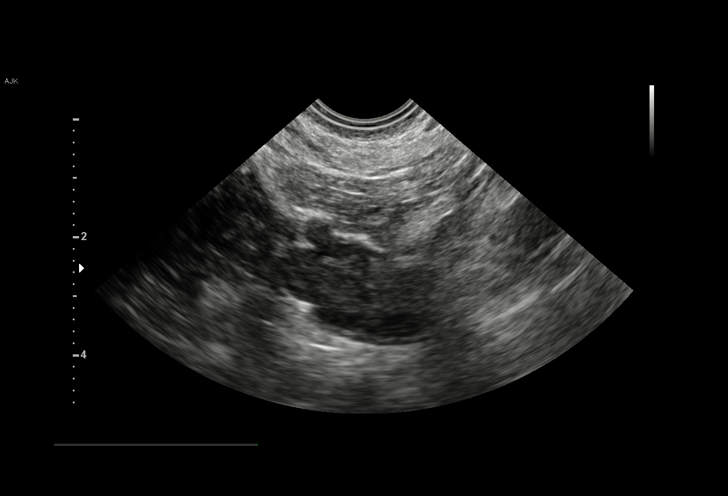
[im 15/31]
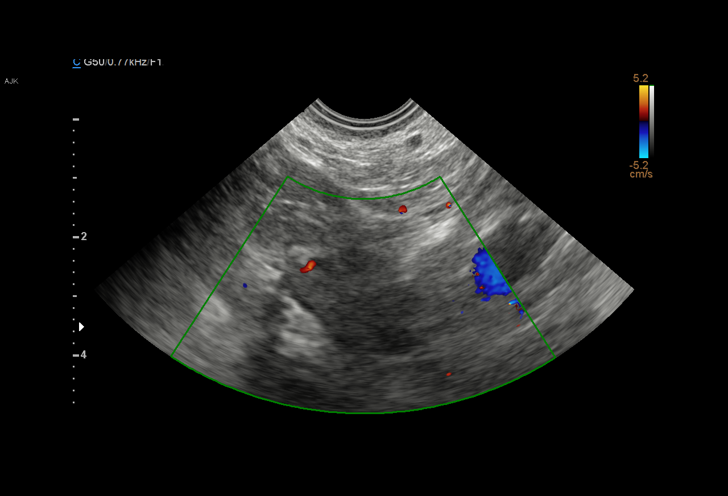
[im 16/31]
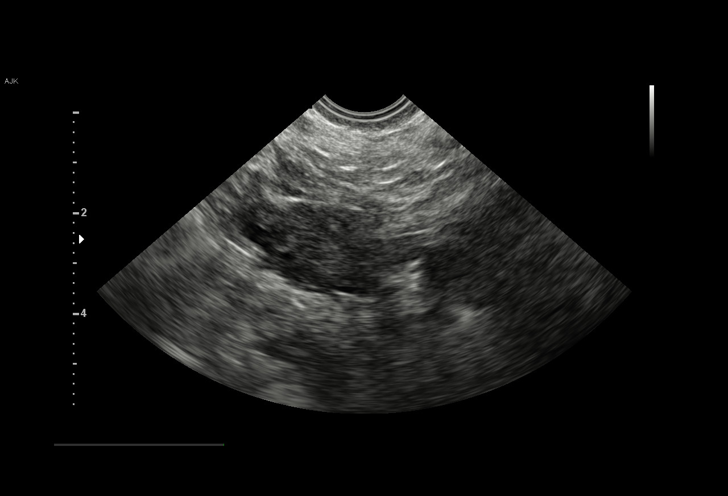
[im 18/31]
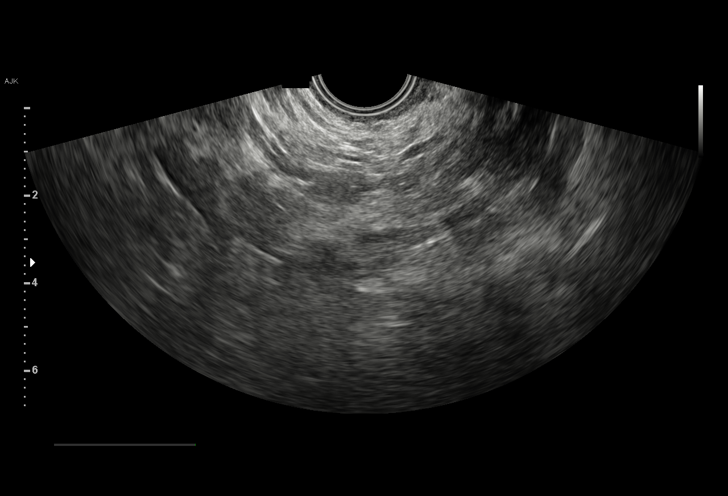
[im 21/31]
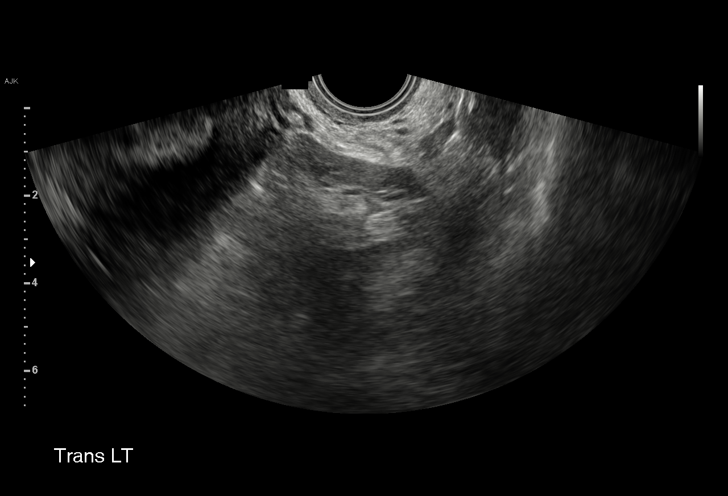
[im 23/31]
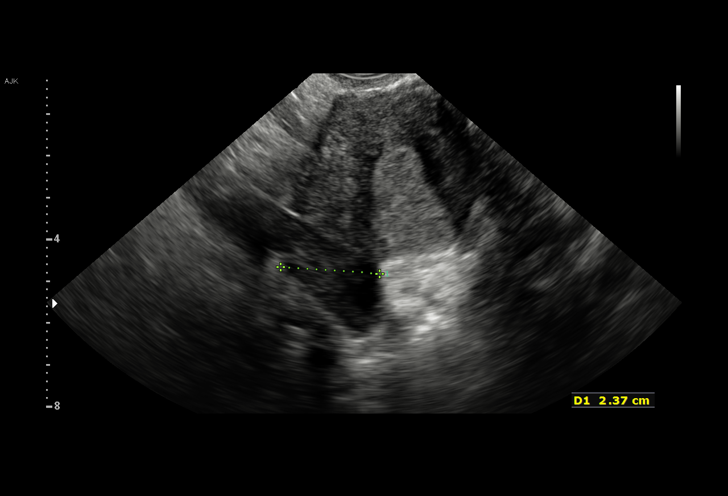
[im 24/31]
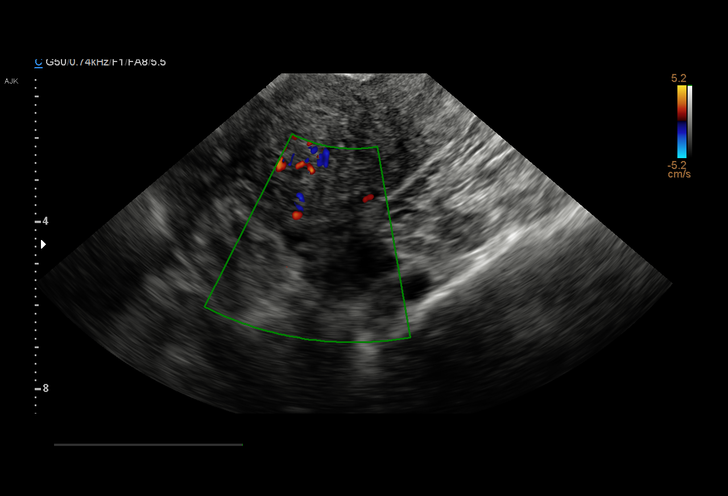
[im 26/31]
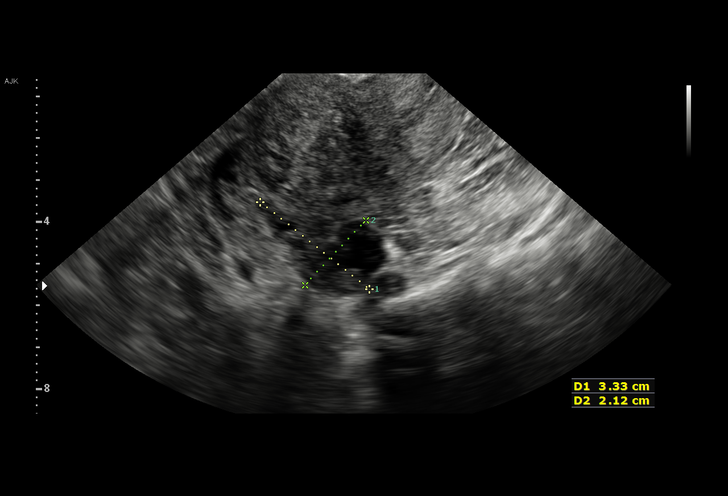
[im 28/31]
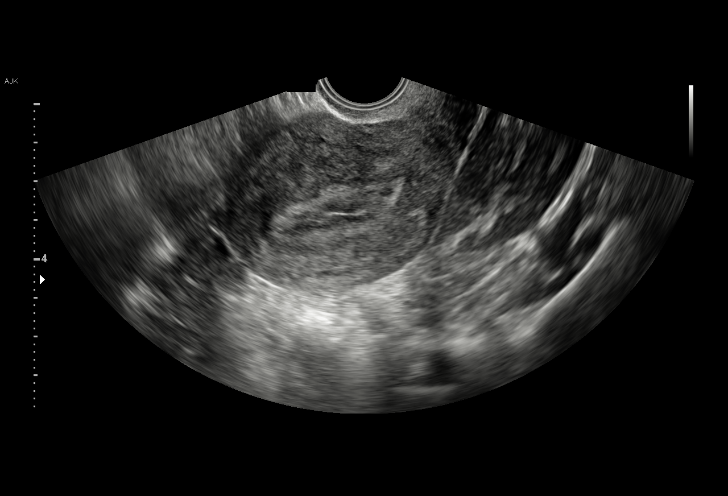
[im 31/31]
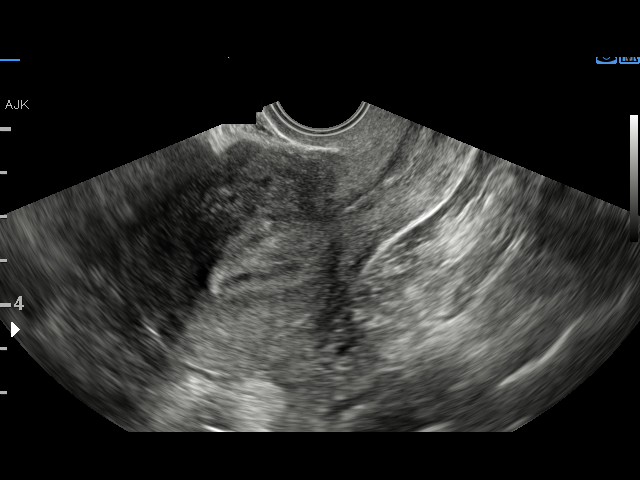

[16 of 28 positions shown; findings below may reference images not displayed]

FINDINGS: No intra or extra uterine gestational sac is seen. No adnexal mass
or ovarian enlargement. No visible pelvic fluid.
IMPRESSION: Pregnancy of unknown location with negative pelvic ultrasound.

## 2020-12-15 MED ORDER — BUSPIRONE HCL 15 MG PO TABS: 15.0000 mg | ORAL_TABLET | Freq: Two times a day (BID) | ORAL | 0 refills | Status: DC

## 2020-12-15 MED ORDER — LORAZEPAM 0.5 MG PO TABS: 0.5000 mg | ORAL_TABLET | Freq: Every day | ORAL | 0 refills | Status: DC | PRN

## 2020-12-15 MED ORDER — LANSOPRAZOLE 30 MG PO CPDR: 30.0000 mg | DELAYED_RELEASE_CAPSULE | Freq: Every day | ORAL | 3 refills | Status: DC

## 2020-12-15 NOTE — Patient Instructions (Addendum)
Replace pepcid with the prevacid 30mg  I have sent in.  Ok to use Tums if needed for breakthrough symptoms but the prevacid should prevent stomach irritation.  We discussed the dangers of benzodiazepines today.  Ativan can cause hallucinations, falls, breathing problems, dementia and even death.   This medication is to be used SPARINGLY and ONLY as directed.  Controlled Substance Guidelines:  1. You cannot get an early refill, even it is lost.  2. You cannot get controlled medications from any other doctor, unless it is the emergency department and related to a new problem or injury.  3. You cannot use alcohol, marijuana, cocaine or any other recreational drugs while using this medication. This is very dangerous.  4. You are willing to have your urine drug tested at each visit.  5. You will not drive while using this medication, because that can put yourself and others in serious danger of an accident. 6. If any medication is stolen, then there must be a police report to verify it, or it cannot be refilled.  7. I will not prescribe these medications for longer than 3 months.  8. You must bring your pill bottle to each visit.  9. You must use the same pharmacy for all refills for the medication, unless you clear it with me beforehand.  10. You cannot share or sell this medication.

## 2020-12-15 NOTE — Progress Notes (Signed)
Subjective: CC: ER follow-up PCP: Raliegh Ip, DO ZWC:HENIDP M Ortloff is a 40 y.o. female presenting to clinic today for:  1.  ER follow-up Patient was seen in the emergency department on 12/11/2020.  She notes that she had a severe panic attack that caused some chest pain and shortness of breath.  This seemed to be triggered by having to watch her niece for 10 hours, who apparently was very rambunctious.  Her daughter, who came to help her with her niece had up falling down the stairs on her way there spraining her ankle.  She said that is really what set her off until full-blown panic attack.  She has been compliant with the medications that were prescribed at her last visit in fact has advanced now to 10 mg of buspirone twice daily.  She does feel that she needs to go further with this medicine and is asking for that today.  She remains in remission from alcohol use and does not engage in any other illegal substances.  Overall she does not have many severe panic attacks but she does continue to have several smaller panic attacks that she is able to cope with by breathing exercises and meditation.  She has gotten appointment with a counselor on Wednesday but is still waiting for the psychiatrist to reach out to her.  She inquires about that today.  2.  GERD Patient has ongoing uncontrolled GERD with Pepcid.  She often has to take 3 Tums at a time to get any relief.  No reports of nausea or vomiting.   ROS: Per HPI  Allergies  Allergen Reactions   Penicillins     Hives and itching   Sulfa Antibiotics     Hives and itching    Past Medical History:  Diagnosis Date   Anxiety    Arthritis    Depression    Endometriosis    Headache    Hx of tubal ligation 2008   Hypertension    Infection    UTI    Current Outpatient Medications:    amLODipine (NORVASC) 10 MG tablet, Take 1 tablet (10 mg total) by mouth daily., Disp: 90 tablet, Rfl: 1   azelastine (ASTELIN) 0.1 % nasal spray,  Place 1 spray into both nostrils 2 (two) times daily., Disp: 30 mL, Rfl: 12   buPROPion (WELLBUTRIN XL) 300 MG 24 hr tablet, Take 1 tablet (300 mg total) by mouth daily., Disp: 30 tablet, Rfl: 5   busPIRone (BUSPAR) 5 MG tablet, Take 1 tablet (5 mg total) by mouth 2 (two) times daily for 7 days, THEN 1.5 tablets (7.5 mg total) 2 (two) times daily for 7 days, THEN 2 tablets (10 mg total) 2 (two) times daily for 14 days., Disp: 91 tablet, Rfl: 0   cetirizine (ZYRTEC) 10 MG tablet, Take 1 tablet (10 mg total) by mouth daily., Disp: 30 tablet, Rfl: 11   escitalopram (LEXAPRO) 20 MG tablet, Take 1 tablet (20 mg total) by mouth daily., Disp: 30 tablet, Rfl: 5   famotidine (PEPCID) 20 MG tablet, Take 20 mg by mouth daily. , Disp: , Rfl:    gabapentin (NEURONTIN) 300 MG capsule, Take 1 capsule (300 mg total) by mouth 2 (two) times daily., Disp: 60 capsule, Rfl: 3   hydrochlorothiazide (HYDRODIURIL) 12.5 MG tablet, Take 1 tablet (12.5 mg total) by mouth daily as needed (swelling/ edema)., Disp: 90 tablet, Rfl: 3   ibuprofen (ADVIL) 600 MG tablet, Take 600 mg by mouth 4 (four)  times daily as needed., Disp: , Rfl:    metoprolol succinate (TOPROL-XL) 25 MG 24 hr tablet, Take 0.5 tablets (12.5 mg total) by mouth daily., Disp: 45 tablet, Rfl: 3   metroNIDAZOLE (METROGEL VAGINAL) 0.75 % vaginal gel, Use 1 applicatorful vaginally immediately after intercourse as needed., Disp: 70 g, Rfl: 1  Current Facility-Administered Medications:    levonorgestrel (MIRENA) 20 MCG/24HR IUD 1 each, 1 each, Intrauterine, Once, Dettinger, Elige Radon, MD Social History   Socioeconomic History   Marital status: Divorced    Spouse name: Not on file   Number of children: Not on file   Years of education: Not on file   Highest education level: Not on file  Occupational History   Occupation: CMA  Tobacco Use   Smoking status: Former    Packs/day: 1.00    Years: 5.00    Pack years: 5.00    Types: Cigarettes    Start date:  05/29/2006    Quit date: 01/07/2020    Years since quitting: 0.9   Smokeless tobacco: Never  Vaping Use   Vaping Use: Some days  Substance and Sexual Activity   Alcohol use: Yes    Comment: occasional   Drug use: No   Sexual activity: Yes    Birth control/protection: Injection    Comment: BTL  Other Topics Concern   Not on file  Social History Narrative   Not on file   Social Determinants of Health   Financial Resource Strain: Not on file  Food Insecurity: No Food Insecurity   Worried About Running Out of Food in the Last Year: Never true   Ran Out of Food in the Last Year: Never true  Transportation Needs: No Transportation Needs   Lack of Transportation (Medical): No   Lack of Transportation (Non-Medical): No  Physical Activity: Not on file  Stress: Not on file  Social Connections: Not on file  Intimate Partner Violence: Not on file   Family History  Problem Relation Age of Onset   Depression Mother    Stroke Mother    Diabetes Father    Hypertension Father    Stroke Father    Heart attack Father 66   Hypertension Sister    Hypertension Brother    Heart attack Brother 41       x3 MI (twin bro)   CAD Brother    Asthma Daughter    Breast cancer Paternal Grandmother 22   Hypertension Brother    Healthy Daughter     Objective: Office vital signs reviewed. BP 126/90   Pulse 80   Temp (!) 97.4 F (36.3 C)   Ht 5\' 3"  (1.6 m)   Wt 205 lb 12.8 oz (93.4 kg)   SpO2 100%   BMI 36.46 kg/m   Physical Examination:  General: Awake, alert, well nourished, No acute distress HEENT: Normal, sclera white, MMM Cardio: regular rate and rhythm, S1S2 heard, no murmurs appreciated Pulm: Normal work of breathing on room air Psych: Mood is stable.  Patient is pleasant and interactive.  Does not appear to be responding to internal stimuli.  Thought process linear.  Good eye contact.  Depression screen [facility] 2/9 12/15/2020 11/23/2020 11/23/2020  Decreased Interest 1 1 1   Down,  Depressed, Hopeless 1 1 1   PHQ - 2 Score 2 2 2   Altered sleeping 3 3 3   Tired, decreased energy 3 3 3   Change in appetite 1 2 2   Feeling bad or failure about yourself  0 0 0  Trouble concentrating 0 1 -  Moving slowly or fidgety/restless 0 3 3  Suicidal thoughts 0 0 0  PHQ-9 Score 9 14 13   Difficult doing work/chores Somewhat difficult Somewhat difficult Somewhat difficult  Some recent data might be hidden   GAD 7 : Generalized Anxiety Score 12/15/2020 11/23/2020 11/23/2020 08/23/2020  Nervous, Anxious, on Edge 2 3 3 2   Control/stop worrying 1 3 3 2   Worry too much - different things 1 3 3 3   Trouble relaxing 1 3 3 2   Restless 0 1 1 1   Easily annoyed or irritable 3 3 3 1   Afraid - awful might happen 1 0 0 0  Total GAD 7 Score 9 16 16 11   Anxiety Difficulty Very difficult Somewhat difficult Somewhat difficult -      Assessment/ Plan: 40 y.o. female   Severe anxiety with panic - Plan: LORazepam (ATIVAN) 0.5 MG tablet, ToxASSURE Select 13 (MW), Urine, busPIRone (BUSPAR) 15 MG tablet  Depression, major, single episode, moderate (HCC)  Alcoholism in recovery (HCC) - Plan: ToxASSURE Select 13 (MW), Urine  Gastroesophageal reflux disease without esophagitis - Plan: lansoprazole (PREVACID) 30 MG capsule  I reviewed her emergency department visit in the EMR.  Given her severe symptoms that seemingly situational and refractory to current medications given her very small supply of Ativan to have on hand.  We discussed at length the risk of this medication, particularly given the history of alcoholism in recovery.  I have advised her to use this medication extremely sparingly.  I do not expect her to use it more than once a month.  We discussed risk of dependency, adverse events.  This was reiterated on AVS.  CSA and UDS were obtained as per office policy.  She will follow-up as scheduled with for genetic testing.  I have advanced her BuSpar to 15 mg twice daily.  All other  medications for mental health have been kept the same.  I will reach out to [facility] with regards to her appointment with [facility].  I have replaced her Pepcid with Prevacid for GERD.  The Narcotic Database has been reviewed.  There were no red flags.     No orders of the defined types were placed in this encounter.  No orders of the defined types were placed in this encounter.    , DO [facility] [phone]

## 2020-12-20 ENCOUNTER — Ambulatory Visit (INDEPENDENT_AMBULATORY_CARE_PROVIDER_SITE_OTHER): Payer: Medicaid Other | Admitting: Family Medicine

## 2020-12-20 ENCOUNTER — Encounter: Payer: Self-pay | Admitting: Family Medicine

## 2020-12-20 ENCOUNTER — Other Ambulatory Visit: Payer: Self-pay

## 2020-12-20 VITALS — BP 121/84 | HR 72 | Temp 97.9°F | Ht 63.0 in | Wt 207.8 lb

## 2020-12-20 DIAGNOSIS — F410 Panic disorder [episodic paroxysmal anxiety] without agoraphobia: Secondary | ICD-10-CM

## 2020-12-20 DIAGNOSIS — F321 Major depressive disorder, single episode, moderate: Secondary | ICD-10-CM | POA: Diagnosis not present

## 2020-12-20 LAB — TOXASSURE SELECT 13 (MW), URINE

## 2020-12-20 NOTE — Progress Notes (Signed)
Assessment & Plan:  1-2. Severe anxiety with panic/Depression, major, single episode, moderate (HCC) Uncontrolled. GeneSight testing completed today.    Follow up plan: Return as directed by PCP when test results.  Deliah Boston, MSN, APRN, FNP-C [facility]  Subjective:   Patient ID: [name], female    DOB: [birth year], 40 y.o.   MRN: [id number]  HPI: [name] is a 40 y.o. female presenting on 12/20/2020 for genesight  Patient is here for GeneSight testing at the request of her PCP for anxiety and depression. She is currently taking Wellbutrin, BuSpar, Lexapro, and Ativan. She has previously failed Trazodone and Atarax.   Depression screen [facility] 2/9 12/20/2020 12/15/2020 11/23/2020  Decreased Interest 1 1 1   Down, Depressed, Hopeless 0 1 1  PHQ - 2 Score 1 2 2   Altered sleeping 3 3 3   Tired, decreased energy 3 3 3   Change in appetite 0 1 2  Feeling bad or failure about yourself  0 0 0  Trouble concentrating 3 0 1  Moving slowly or fidgety/restless 0 0 3  Suicidal thoughts 0 0 0  PHQ-9 Score 10 9 14   Difficult doing work/chores Very difficult Somewhat difficult Somewhat difficult  Some recent data might be hidden   GAD 7 : Generalized Anxiety Score 12/20/2020 12/15/2020 11/23/2020 11/23/2020  Nervous, Anxious, on Edge 2 2 3 3   Control/stop worrying 3 1 3 3   Worry too much - different things 2 1 3 3   Trouble relaxing 3 1 3 3   Restless 0 0 1 1  Easily annoyed or irritable 0 3 3 3   Afraid - awful might happen 2 1 0 0  Total GAD 7 Score 12 9 16 16   Anxiety Difficulty Very difficult Very difficult Somewhat difficult Somewhat difficult    ROS: Negative unless specifically indicated above in HPI.   Relevant past medical history reviewed and updated as indicated.   Allergies and medications reviewed and updated.   Current Outpatient Medications:    amLODipine (NORVASC) 10 MG tablet, Take 1 tablet (10 mg total) by mouth daily., Disp: 90 tablet,  Rfl: 1   azelastine (ASTELIN) 0.1 % nasal spray, Place 1 spray into both nostrils 2 (two) times daily., Disp: 30 mL, Rfl: 12   buPROPion (WELLBUTRIN XL) 300 MG 24 hr tablet, Take 1 tablet (300 mg total) by mouth daily., Disp: 30 tablet, Rfl: 5   busPIRone (BUSPAR) 15 MG tablet, Take 1 tablet (15 mg total) by mouth 2 (two) times daily., Disp: 180 tablet, Rfl: 0   cetirizine (ZYRTEC) 10 MG tablet, Take 1 tablet (10 mg total) by mouth daily., Disp: 30 tablet, Rfl: 11   escitalopram (LEXAPRO) 20 MG tablet, Take 1 tablet (20 mg total) by mouth daily., Disp: 30 tablet, Rfl: 5   gabapentin (NEURONTIN) 300 MG capsule, Take 1 capsule (300 mg total) by mouth 2 (two) times daily., Disp: 60 capsule, Rfl: 3   hydrochlorothiazide (HYDRODIURIL) 12.5 MG tablet, Take 1 tablet (12.5 mg total) by mouth daily as needed (swelling/ edema)., Disp: 90 tablet, Rfl: 3   ibuprofen (ADVIL) 600 MG tablet, Take 600 mg by mouth 4 (four) times daily as needed., Disp: , Rfl:    lansoprazole (PREVACID) 30 MG capsule, Take 1 capsule (30 mg total) by mouth daily. For acid reflux, Disp: 90 capsule, Rfl: 3   LORazepam (ATIVAN) 0.5 MG tablet, Take 1 tablet (0.5 mg total) by mouth daily as needed for anxiety. USE SPARINGLY, Disp: 10 tablet, Rfl:  0   metoprolol succinate (TOPROL-XL) 25 MG 24 hr tablet, Take 0.5 tablets (12.5 mg total) by mouth daily., Disp: 45 tablet, Rfl: 3   metroNIDAZOLE (METROGEL VAGINAL) 0.75 % vaginal gel, Use 1 applicatorful vaginally immediately after intercourse as needed., Disp: 70 g, Rfl: 1  Current Facility-Administered Medications:    levonorgestrel (MIRENA) 20 MCG/24HR IUD 1 each, 1 each, Intrauterine, Once, Dettinger, Elige Radon, MD  Allergies  Allergen Reactions   Penicillins     Hives and itching   Sulfa Antibiotics     Hives and itching     Objective:   BP 121/84   Pulse 72   Temp 97.9 F (36.6 C) (Temporal)   Ht 5\' 3"  (1.6 m)   Wt 207 lb 12.8 oz (94.3 kg)   SpO2 98%   BMI 36.81 kg/m     Physical Exam Vitals reviewed.  Constitutional:      General: She is not in acute distress.    Appearance: Normal appearance. She is not ill-appearing, toxic-appearing or diaphoretic.  HENT:     Head: Normocephalic and atraumatic.  Eyes:     General: No scleral icterus.       Right eye: No discharge.        Left eye: No discharge.     Conjunctiva/sclera: Conjunctivae normal.  Cardiovascular:     Rate and Rhythm: Normal rate.  Pulmonary:     Effort: Pulmonary effort is normal. No respiratory distress.  Musculoskeletal:        General: Normal range of motion.     Cervical back: Normal range of motion.  Skin:    General: Skin is warm and dry.     Capillary Refill: Capillary refill takes less than 2 seconds.  Neurological:     General: No focal deficit present.     Mental Status: She is alert and oriented to person, place, and time. Mental status is at baseline.  Psychiatric:        Mood and Affect: Mood normal.        Behavior: Behavior normal.        Thought Content: Thought content normal.        Judgment: Judgment normal.

## 2021-01-01 ENCOUNTER — Encounter: Payer: Self-pay | Admitting: Family Medicine

## 2021-01-01 DIAGNOSIS — Z419 Encounter for procedure for purposes other than remedying health state, unspecified: Secondary | ICD-10-CM | POA: Diagnosis not present

## 2021-01-02 ENCOUNTER — Ambulatory Visit (INDEPENDENT_AMBULATORY_CARE_PROVIDER_SITE_OTHER): Payer: Medicaid Other | Admitting: Family Medicine

## 2021-01-02 ENCOUNTER — Encounter: Payer: Self-pay | Admitting: Family Medicine

## 2021-01-02 DIAGNOSIS — F410 Panic disorder [episodic paroxysmal anxiety] without agoraphobia: Secondary | ICD-10-CM | POA: Diagnosis not present

## 2021-01-02 DIAGNOSIS — F321 Major depressive disorder, single episode, moderate: Secondary | ICD-10-CM

## 2021-01-02 DIAGNOSIS — F419 Anxiety disorder, unspecified: Secondary | ICD-10-CM | POA: Diagnosis not present

## 2021-01-02 MED ORDER — DESVENLAFAXINE SUCCINATE ER 50 MG PO TB24: 50.0000 mg | ORAL_TABLET | Freq: Every day | ORAL | 2 refills | Status: DC

## 2021-01-02 NOTE — Progress Notes (Signed)
Telephone visit  Subjective: CC: gene site testing results. GAD/ Depression PCP: Raliegh Ip, DO ZOX:WRUEAV M Wilds is a 40 y.o. female calls for telephone consult today. Patient provides verbal consent for consult held via phone.  Due to COVID-19 pandemic this visit was conducted virtually. This visit type was conducted due to national recommendations for restrictions regarding the COVID-19 Pandemic (e.g. social distancing, sheltering in place) in an effort to limit this patient's exposure and mitigate transmission in our community. All issues noted in this document were discussed and addressed.  A physical exam was not performed with this format.   Location of patient: car Location of provider: [facility] Others present for call: none  1. GAD/ depression Anxiety and depression have not been super well controlled but she did have GeneSight testing done recently and she would like to review these results with me.  No SI, HI.  Continues to have difficulty losing weight despite use of Wellbutrin.   ROS: Per HPI  Allergies  Allergen Reactions   Penicillins     Hives and itching   Sulfa Antibiotics     Hives and itching    Past Medical History:  Diagnosis Date   Anxiety    Arthritis    Depression    Endometriosis    Headache    Hx of tubal ligation 2008   Hypertension    Infection    UTI    Current Outpatient Medications:    amLODipine (NORVASC) 10 MG tablet, Take 1 tablet (10 mg total) by mouth daily., Disp: 90 tablet, Rfl: 1   azelastine (ASTELIN) 0.1 % nasal spray, Place 1 spray into both nostrils 2 (two) times daily., Disp: 30 mL, Rfl: 12   buPROPion (WELLBUTRIN XL) 300 MG 24 hr tablet, Take 1 tablet (300 mg total) by mouth daily., Disp: 30 tablet, Rfl: 5   busPIRone (BUSPAR) 15 MG tablet, Take 1 tablet (15 mg total) by mouth 2 (two) times daily., Disp: 180 tablet, Rfl: 0   cetirizine (ZYRTEC) 10 MG tablet, Take 1 tablet (10 mg total) by mouth daily., Disp: 30 tablet,  Rfl: 11   escitalopram (LEXAPRO) 20 MG tablet, Take 1 tablet (20 mg total) by mouth daily., Disp: 30 tablet, Rfl: 5   gabapentin (NEURONTIN) 300 MG capsule, Take 1 capsule (300 mg total) by mouth 2 (two) times daily., Disp: 60 capsule, Rfl: 3   hydrochlorothiazide (HYDRODIURIL) 12.5 MG tablet, Take 1 tablet (12.5 mg total) by mouth daily as needed (swelling/ edema)., Disp: 90 tablet, Rfl: 3   ibuprofen (ADVIL) 600 MG tablet, Take 600 mg by mouth 4 (four) times daily as needed., Disp: , Rfl:    lansoprazole (PREVACID) 30 MG capsule, Take 1 capsule (30 mg total) by mouth daily. For acid reflux, Disp: 90 capsule, Rfl: 3   LORazepam (ATIVAN) 0.5 MG tablet, Take 1 tablet (0.5 mg total) by mouth daily as needed for anxiety. USE SPARINGLY, Disp: 10 tablet, Rfl: 0   metoprolol succinate (TOPROL-XL) 25 MG 24 hr tablet, Take 0.5 tablets (12.5 mg total) by mouth daily., Disp: 45 tablet, Rfl: 3   metroNIDAZOLE (METROGEL VAGINAL) 0.75 % vaginal gel, Use 1 applicatorful vaginally immediately after intercourse as needed., Disp: 70 g, Rfl: 1  Current Facility-Administered Medications:    levonorgestrel (MIRENA) 20 MCG/24HR IUD 1 each, 1 each, Intrauterine, Once, Dettinger, Elige Radon, MD            Assessment/ Plan: 40 y.o. female   Severe anxiety with panic - Plan: desvenlafaxine (  PRISTIQ) 50 MG 24 hr tablet  Depression, major, single episode, moderate (HCC) - Plan: desvenlafaxine (PRISTIQ) 50 MG 24 hr tablet  Anxiety - Plan: desvenlafaxine (PRISTIQ) 50 MG 24 hr tablet  Continue buspirone.  After reviewing options, we will start Pristiq.  She has been on Lexapro 10 mg only so I am asking that she does a washout for a few days before initiating the Pristiq.  For now she will continue the Wellbutrin but we may consider reducing the dose versus tapering off if she does not find it to be especially effective going forward.  We discussed that Medicaid does not cover any of the weight loss medications but I  did offer her referral to dietitian she declined this today.  Perhaps will see a positive impact in her weight with changing her medication around.  We will plan to follow-up in 6 weeks, sooner if needed  Start time: 11:31am End time: 11:42am  Total time spent on patient care (including telephone call/ virtual visit): 11 minutes  Darrell Hauk Hulen Skains, DO [facility] [phone]

## 2021-01-09 ENCOUNTER — Other Ambulatory Visit: Payer: Self-pay | Admitting: Family Medicine

## 2021-01-09 ENCOUNTER — Encounter: Payer: Self-pay | Admitting: Family Medicine

## 2021-01-09 DIAGNOSIS — I10 Essential (primary) hypertension: Secondary | ICD-10-CM

## 2021-01-11 ENCOUNTER — Encounter: Payer: Self-pay | Admitting: Family Medicine

## 2021-01-12 ENCOUNTER — Other Ambulatory Visit: Payer: Self-pay | Admitting: Family Medicine

## 2021-01-12 DIAGNOSIS — F410 Panic disorder [episodic paroxysmal anxiety] without agoraphobia: Secondary | ICD-10-CM

## 2021-01-12 MED ORDER — BUSPIRONE HCL 30 MG PO TABS: 30.0000 mg | ORAL_TABLET | Freq: Two times a day (BID) | ORAL | 0 refills | Status: DC

## 2021-01-23 ENCOUNTER — Encounter: Payer: Self-pay | Admitting: Family Medicine

## 2021-01-23 NOTE — Telephone Encounter (Signed)
I thought she was seeing [facility] for psychiatric services?  I'd really like their input as we have exhausted medications for her mental health from a primary care standpoint.

## 2021-01-29 ENCOUNTER — Encounter: Payer: Self-pay | Admitting: Family Medicine

## 2021-01-29 DIAGNOSIS — F4323 Adjustment disorder with mixed anxiety and depressed mood: Secondary | ICD-10-CM | POA: Diagnosis not present

## 2021-02-01 DIAGNOSIS — Z419 Encounter for procedure for purposes other than remedying health state, unspecified: Secondary | ICD-10-CM | POA: Diagnosis not present

## 2021-02-02 ENCOUNTER — Ambulatory Visit: Payer: Medicaid Other | Admitting: Family Medicine

## 2021-02-06 DIAGNOSIS — Z6836 Body mass index (BMI) 36.0-36.9, adult: Secondary | ICD-10-CM | POA: Diagnosis not present

## 2021-02-06 DIAGNOSIS — F410 Panic disorder [episodic paroxysmal anxiety] without agoraphobia: Secondary | ICD-10-CM | POA: Diagnosis not present

## 2021-02-06 DIAGNOSIS — R079 Chest pain, unspecified: Secondary | ICD-10-CM | POA: Diagnosis not present

## 2021-02-08 ENCOUNTER — Encounter: Payer: Self-pay | Admitting: Family Medicine

## 2021-02-08 DIAGNOSIS — F329 Major depressive disorder, single episode, unspecified: Secondary | ICD-10-CM | POA: Diagnosis not present

## 2021-02-13 ENCOUNTER — Ambulatory Visit: Payer: Medicaid Other | Admitting: Family Medicine

## 2021-02-13 ENCOUNTER — Other Ambulatory Visit: Payer: Self-pay

## 2021-02-13 ENCOUNTER — Encounter: Payer: Self-pay | Admitting: Family Medicine

## 2021-02-13 VITALS — BP 128/88 | HR 87 | Temp 97.3°F | Ht 63.0 in | Wt 217.0 lb

## 2021-02-13 DIAGNOSIS — F321 Major depressive disorder, single episode, moderate: Secondary | ICD-10-CM

## 2021-02-13 DIAGNOSIS — M722 Plantar fascial fibromatosis: Secondary | ICD-10-CM

## 2021-02-13 DIAGNOSIS — F410 Panic disorder [episodic paroxysmal anxiety] without agoraphobia: Secondary | ICD-10-CM

## 2021-02-13 DIAGNOSIS — F1021 Alcohol dependence, in remission: Secondary | ICD-10-CM

## 2021-02-13 MED ORDER — LORAZEPAM 0.5 MG PO TABS: 0.5000 mg | ORAL_TABLET | Freq: Every day | ORAL | 0 refills | Status: DC | PRN

## 2021-02-13 NOTE — Progress Notes (Signed)
Subjective: CC: GAD/ Depression PCP: Caitlin Norlander, DO QTM:[id number] [name] is a 40 y.o. female presenting to clinic today for:  1.  Anxiety, depression with panic attack Since her last visit, patient's medications have been adjusted several times via messaging system.  She ultimately was placed on BuSpar 30 mg twice daily but recently messaged to state that this was causing her to feel more panicked and therefore reduced it back to 15 mg twice daily.  She was continued on Wellbutrin, Pristiq 50 mg.  She was referred to psychiatry as she was having ongoing issues with sleep and mental health instability.  She apparently attempted to get her records and genetic testing results from [place] but had to sign a release of information form.  She reports to me that the BuSpar continue to be an issue and cause numbness around her mouth, at her wrists and make her feel like her heart was racing so she got back to 5 mg twice a day only in efforts not to abruptly discontinue.  She has been utilizing her Ativan for breakthrough panic.  She in fact notes a panic attack that was so severe that she could not go to training for her new job.  She has met with the [facility] doctor but they are awaiting her records from here in efforts to get her set up with appropriate medications.   ROS: Per HPI  Allergies  Allergen Reactions   Penicillins     Hives and itching   Sulfa Antibiotics     Hives and itching    Past Medical History:  Diagnosis Date   Anxiety    Arthritis    Depression    Endometriosis    Headache    Hx of tubal ligation 2008   Hypertension    Infection    UTI    Current Outpatient Medications:    amLODipine (NORVASC) 10 MG tablet, Take 1 tablet by mouth once daily, Disp: 90 tablet, Rfl: 0   azelastine (ASTELIN) 0.1 % nasal spray, Place 1 spray into both nostrils 2 (two) times daily., Disp: 30 mL, Rfl: 12   buPROPion (WELLBUTRIN XL) 300 MG 24 hr tablet, Take 1 tablet (300 mg total) by  mouth daily., Disp: 30 tablet, Rfl: 5   busPIRone (BUSPAR) 30 MG tablet, Take 1 tablet (30 mg total) by mouth 2 (two) times daily., Disp: 180 tablet, Rfl: 0   cetirizine (ZYRTEC) 10 MG tablet, Take 1 tablet (10 mg total) by mouth daily., Disp: 30 tablet, Rfl: 11   desvenlafaxine (PRISTIQ) 50 MG 24 hr tablet, Take 1 tablet (50 mg total) by mouth daily., Disp: 30 tablet, Rfl: 2   gabapentin (NEURONTIN) 300 MG capsule, Take 1 capsule (300 mg total) by mouth 2 (two) times daily., Disp: 60 capsule, Rfl: 3   hydrochlorothiazide (HYDRODIURIL) 12.5 MG tablet, Take 1 tablet (12.5 mg total) by mouth daily as needed (swelling/ edema)., Disp: 90 tablet, Rfl: 3   ibuprofen (ADVIL) 600 MG tablet, Take 600 mg by mouth 4 (four) times daily as needed., Disp: , Rfl:    lansoprazole (PREVACID) 30 MG capsule, Take 1 capsule (30 mg total) by mouth daily. For acid reflux, Disp: 90 capsule, Rfl: 3   LORazepam (ATIVAN) 0.5 MG tablet, Take 1 tablet (0.5 mg total) by mouth daily as needed for anxiety. USE SPARINGLY, Disp: 10 tablet, Rfl: 0   metoprolol succinate (TOPROL-XL) 25 MG 24 hr tablet, Take 0.5 tablets (12.5 mg total) by mouth daily., Disp: 45  tablet, Rfl: 3   metroNIDAZOLE (METROGEL VAGINAL) 0.75 % vaginal gel, Use 1 applicatorful vaginally immediately after intercourse as needed., Disp: 70 g, Rfl: 1  Current Facility-Administered Medications:    levonorgestrel (MIRENA) 20 MCG/24HR IUD 1 each, 1 each, Intrauterine, Once, Dettinger, Caitlin Kaufmann, MD Social History   Socioeconomic History   Marital status: Divorced    Spouse name: Not on file   Number of children: Not on file   Years of education: Not on file   Highest education level: Not on file  Occupational History   Occupation: CMA  Tobacco Use   Smoking status: Former    Packs/day: 1.00    Years: 5.00    Pack years: 5.00    Types: Cigarettes    Start date: 05/29/2006    Quit date: 01/07/2020    Years since quitting: 1.1   Smokeless tobacco: Never   Vaping Use   Vaping Use: Some days  Substance and Sexual Activity   Alcohol use: Yes    Comment: occasional   Drug use: No   Sexual activity: Yes    Birth control/protection: Injection    Comment: BTL  Other Topics Concern   Not on file  Social History Narrative   Not on file   Social Determinants of Health   Financial Resource Strain: Not on file  Food Insecurity: Not on file  Transportation Needs: Not on file  Physical Activity: Not on file  Stress: Not on file  Social Connections: Not on file  Intimate Partner Violence: Not on file   Family History  Problem Relation Age of Onset   Depression Mother    Stroke Mother    Diabetes Father    Hypertension Father    Stroke Father    Heart attack Father 32   Hypertension Sister    Hypertension Brother    Heart attack Brother 48       x3 MI (twin bro)   CAD Brother    Asthma Daughter    Breast cancer Paternal Grandmother 67   Hypertension Brother    Healthy Daughter     Objective: Office vital signs reviewed. BP (!) 127/92   Pulse 87   Temp (!) 97.3 F (36.3 C)   Ht 5' 3" (1.6 m)   Wt 217 lb (98.4 kg)   SpO2 100%   BMI 38.44 kg/m   Physical Examination:  General: Awake, alert, well nourished, No acute distress HEENT: Normal, sclera white MSK:  Right foot: Tenderness to palpation along the plantar surface of the heel Psych: Mood stable.  Good eye contact  Depression screen [facility] 2/9 02/13/2021 12/20/2020 12/15/2020  Decreased Interest _0 Down, Depressed, Hopeless 1 0 1  PHQ - 2 Score _1 Altered sleeping _2 Tired, decreased energy _3 Change in appetite 3 0 1  Feeling bad or failure about yourself  0 0 0  Trouble concentrating 1 3 0  Moving slowly or fidgety/restless 3 0 0  Suicidal thoughts 0 0 0  PHQ-9 Score _4 Difficult doing work/chores Extremely dIfficult Very difficult Somewhat difficult  Some recent data might be hidden   GAD 7 : Generalized Anxiety Score 02/13/2021  12/20/2020 12/15/2020 11/23/2020  Nervous, Anxious, on Edge _5 Control/stop worrying _6 Worry too much - different things _7 Trouble relaxing _8 Restless 1 0 0 1  Easily annoyed or irritable 3 0 3 3  Afraid - awful might happen _0 0  Total GAD 7 Score _1 Anxiety Difficulty Very difficult Very difficult Very difficult Somewhat difficult      Assessment/ Plan: 40 y.o. female   Severe anxiety with panic - Plan: LORazepam (ATIVAN) 0.5 MG tablet  Depression, major, single episode, moderate (HCC)  Alcoholism in recovery ([place])  Plantar fasciitis  Continues to have panic attacks that are clearly interfering with her day-to-day life.  She is currently treated with gabapentin, Wellbutrin, Pristiq 50 mg.  We will discontinue the buspirone given the side effects that is causing.  I agree with continued follow-up with psychiatry in efforts to modify medications.   She remains in recovery from alcoholism  I have given her home physical therapy exercises for the plantar fasciitis.  Agree with supportive footwear, use of ice topically.  We will plan for referral to podiatry for corticosteroid injection if needed going forward  No orders of the defined types were placed in this encounter.  No orders of the defined types were placed in this encounter.  The Narcotic Database has been reviewed.  There were no red flags.     Caitlin Norlander, DO [place] [phone]

## 2021-02-14 DIAGNOSIS — J329 Chronic sinusitis, unspecified: Secondary | ICD-10-CM | POA: Diagnosis not present

## 2021-02-14 DIAGNOSIS — Z6838 Body mass index (BMI) 38.0-38.9, adult: Secondary | ICD-10-CM | POA: Diagnosis not present

## 2021-02-14 DIAGNOSIS — R52 Pain, unspecified: Secondary | ICD-10-CM | POA: Diagnosis not present

## 2021-02-14 DIAGNOSIS — R059 Cough, unspecified: Secondary | ICD-10-CM | POA: Diagnosis not present

## 2021-02-14 DIAGNOSIS — J029 Acute pharyngitis, unspecified: Secondary | ICD-10-CM | POA: Diagnosis not present

## 2021-03-03 DIAGNOSIS — Z419 Encounter for procedure for purposes other than remedying health state, unspecified: Secondary | ICD-10-CM | POA: Diagnosis not present

## 2021-03-09 ENCOUNTER — Encounter: Payer: Self-pay | Admitting: Family Medicine

## 2021-03-12 ENCOUNTER — Other Ambulatory Visit: Payer: Self-pay | Admitting: Family Medicine

## 2021-03-12 DIAGNOSIS — F321 Major depressive disorder, single episode, moderate: Secondary | ICD-10-CM

## 2021-03-13 ENCOUNTER — Encounter: Payer: Self-pay | Admitting: Family Medicine

## 2021-03-13 ENCOUNTER — Telehealth: Payer: Medicaid Other | Admitting: Emergency Medicine

## 2021-03-13 DIAGNOSIS — J069 Acute upper respiratory infection, unspecified: Secondary | ICD-10-CM

## 2021-03-13 MED ORDER — BENZONATATE 100 MG PO CAPS: 100.0000 mg | ORAL_CAPSULE | Freq: Two times a day (BID) | ORAL | 0 refills | Status: DC | PRN

## 2021-03-13 MED ORDER — FLUTICASONE PROPIONATE 50 MCG/ACT NA SUSP: 2.0000 | Freq: Every day | NASAL | 0 refills | Status: DC

## 2021-03-13 NOTE — Progress Notes (Signed)

## 2021-03-23 ENCOUNTER — Telehealth: Payer: Medicaid Other | Admitting: Physician Assistant

## 2021-03-23 DIAGNOSIS — J069 Acute upper respiratory infection, unspecified: Secondary | ICD-10-CM

## 2021-03-23 DIAGNOSIS — B9689 Other specified bacterial agents as the cause of diseases classified elsewhere: Secondary | ICD-10-CM | POA: Diagnosis not present

## 2021-03-23 MED ORDER — BENZONATATE 100 MG PO CAPS: 100.0000 mg | ORAL_CAPSULE | Freq: Three times a day (TID) | ORAL | 0 refills | Status: DC | PRN

## 2021-03-23 MED ORDER — DOXYCYCLINE HYCLATE 100 MG PO TABS: 100.0000 mg | ORAL_TABLET | Freq: Two times a day (BID) | ORAL | 0 refills | Status: DC

## 2021-03-23 NOTE — Progress Notes (Signed)

## 2021-03-27 MED ORDER — PROMETHAZINE-DM 6.25-15 MG/5ML PO SYRP: 5.0000 mL | ORAL_SOLUTION | Freq: Four times a day (QID) | ORAL | 0 refills | Status: DC | PRN

## 2021-03-27 NOTE — Addendum Note (Signed)
Addended by: Waldon Merl on: 03/27/2021 07:12 AM   Modules accepted: Orders

## 2021-03-28 ENCOUNTER — Encounter: Payer: Self-pay | Admitting: Family Medicine

## 2021-03-29 ENCOUNTER — Ambulatory Visit: Payer: Medicaid Other | Admitting: Family Medicine

## 2021-03-30 DIAGNOSIS — M25571 Pain in right ankle and joints of right foot: Secondary | ICD-10-CM | POA: Diagnosis not present

## 2021-03-30 DIAGNOSIS — S8261XA Displaced fracture of lateral malleolus of right fibula, initial encounter for closed fracture: Secondary | ICD-10-CM | POA: Diagnosis not present

## 2021-03-30 DIAGNOSIS — S82831A Other fracture of upper and lower end of right fibula, initial encounter for closed fracture: Secondary | ICD-10-CM | POA: Diagnosis not present

## 2021-03-31 ENCOUNTER — Telehealth: Payer: Medicaid Other | Admitting: Emergency Medicine

## 2021-03-31 DIAGNOSIS — M5441 Lumbago with sciatica, right side: Secondary | ICD-10-CM

## 2021-03-31 MED ORDER — PREDNISONE 20 MG PO TABS: 20.0000 mg | ORAL_TABLET | Freq: Two times a day (BID) | ORAL | 0 refills | Status: AC

## 2021-03-31 MED ORDER — CYCLOBENZAPRINE HCL 10 MG PO TABS: 10.0000 mg | ORAL_TABLET | Freq: Every day | ORAL | 0 refills | Status: DC

## 2021-03-31 NOTE — Progress Notes (Signed)
We do not prescribed narcotic pain medications.  If you broke your ankle you may need stronger medication than we can prescribe.  We will try a steroid and muscle relaxer, but if symptoms do not improve please seek in person evaluation.    We are sorry that you are not feeling well.  Here is how we plan to help!  Based on what you have shared with me it looks like you mostly have acute back pain.  Acute back pain is defined as musculoskeletal pain that can resolve in 1-3 weeks with conservative treatment.  I have prescribed prednisone, a steroid, as well as Flexeril 10 mg every eight hours as needed which is a muscle relaxer  Some patients experience stomach irritation or in increased heartburn with anti-inflammatory drugs.  Please keep in mind that muscle relaxer's can cause fatigue and should not be taken while at work or driving.  Back pain is very common.  The pain often gets better over time.  The cause of back pain is usually not dangerous.  Most people can learn to manage their back pain on their own.  Home Care Stay active.  Start with short walks on flat ground if you can.  Try to walk farther each day. Do not sit, drive or stand in one place for more than 30 minutes.  Do not stay in bed. Do not avoid exercise or work.  Activity can help your back heal faster. Be careful when you bend or lift an object.  Bend at your knees, keep the object close to you, and do not twist. Sleep on a firm mattress.  Lie on your side, and bend your knees.  If you lie on your back, put a pillow under your knees. Only take medicines as told by your doctor. Put ice on the injured area. Put ice in a plastic bag Place a towel between your skin and the bag Leave the ice on for 15-20 minutes, 3-4 times a day for the first 2-3 days. 210 After that, you can switch between ice and heat packs. Ask your doctor about back exercises or massage. Avoid feeling anxious or stressed.  Find good ways to deal with stress,  such as exercise.  Get Help Right Way If: Your pain does not go away with rest or medicine. Your pain does not go away in 1 week. You have new problems. You do not feel well. The pain spreads into your legs. You cannot control when you poop (bowel movement) or pee (urinate) You feel sick to your stomach (nauseous) or throw up (vomit) You have belly (abdominal) pain. You feel like you may pass out (faint). If you develop a fever.  Make Sure you: Understand these instructions. Will watch your condition Will get help right away if you are not doing well or get worse.  Your e-visit answers were reviewed by a board certified advanced clinical practitioner to complete your personal care plan.  Depending on the condition, your plan could have included both over the counter or prescription medications.  If there is a problem please reply  once you have received a response from your provider.  Your safety is important to [place].  If you have drug allergies check your prescription carefully.    You can use MyChart to ask questions about today's visit, request a non-urgent call back, or ask for a work or school excuse for 24 hours related to this e-Visit. If it has been greater than 24 hours you will  need to follow up with your provider, or enter a new e-Visit to address those concerns.  You will get an e-mail in the next two days asking about your experience.  I hope that your e-visit has been valuable and will speed your recovery. Thank you for using e-visits.

## 2021-03-31 NOTE — Progress Notes (Signed)
I have spent 5 minutes in review of e-visit questionnaire, review and updating patient chart, medical decision making and response to patient.   Kashlyn Salinas, PA-C    

## 2021-04-02 ENCOUNTER — Other Ambulatory Visit: Payer: Self-pay | Admitting: Family Medicine

## 2021-04-02 ENCOUNTER — Encounter: Payer: Self-pay | Admitting: Family Medicine

## 2021-04-02 ENCOUNTER — Ambulatory Visit (INDEPENDENT_AMBULATORY_CARE_PROVIDER_SITE_OTHER): Payer: Medicaid Other | Admitting: Family Medicine

## 2021-04-02 DIAGNOSIS — S8264XD Nondisplaced fracture of lateral malleolus of right fibula, subsequent encounter for closed fracture with routine healing: Secondary | ICD-10-CM | POA: Diagnosis not present

## 2021-04-02 DIAGNOSIS — F410 Panic disorder [episodic paroxysmal anxiety] without agoraphobia: Secondary | ICD-10-CM

## 2021-04-02 DIAGNOSIS — F321 Major depressive disorder, single episode, moderate: Secondary | ICD-10-CM

## 2021-04-02 DIAGNOSIS — F419 Anxiety disorder, unspecified: Secondary | ICD-10-CM

## 2021-04-02 MED ORDER — HYDROCODONE-ACETAMINOPHEN 5-325 MG PO TABS: 1.0000 | ORAL_TABLET | Freq: Four times a day (QID) | ORAL | 0 refills | Status: AC | PRN

## 2021-04-02 NOTE — Progress Notes (Signed)
   Virtual Visit  Note Due to COVID-19 pandemic this visit was conducted virtually. This visit type was conducted due to national recommendations for restrictions regarding the COVID-19 Pandemic (e.g. social distancing, sheltering in place) in an effort to limit this patient's exposure and mitigate transmission in our community. All issues noted in this document were discussed and addressed.  A physical exam was not performed with this format.  I connected with [name] on 04/02/21 at 1339 by telephone and verified that I am speaking with the correct person using two identifiers. [name] is currently located at home and her sister is currently with her during the visit. The provider, Gabriel Earing, FNP is located in their office at time of visit.  I discussed the limitations, risks, security and privacy concerns of performing an evaluation and management service by telephone and the availability of in person appointments. I also discussed with the patient that there may be a patient responsible charge related to this service. The patient expressed understanding and agreed to proceed.  CC: leg pain  History and Present Illness:  HPI [name] reports that she fractured her right tibula 4 days ago. She slipped in the kitchen. She was sent at [facility] urgent care where an xray was done. She was told she fractured to lateral malleolus of her right fibula. She was placed in a boot. She has an appointment with orthopedics in 2 days. She reports sharp constant pain in her leg. She has been elevating her foot and using ice. The pain is a 10. She has been taking ibuprofen and a muscle relaxer without improvement. She denies numbness, tingling, fever. She can move her foot and toes. She has some mild swelling.     ROS As per HPI.   Observations/Objective: Alert and oriented x 3. Able to speak in full sentences without difficulty.    Assessment and Plan: [name] was seen today for leg  pain.  Diagnoses and all orders for this visit:  Closed nondisplaced fracture of lateral malleolus of right fibula with routine healing, subsequent encounter PDMP reviewed, no red flags. Norco ordered to bridge patient to orthopedic referral. Discussed that refills will have to be discussed with PCP in the office.  -     HYDROcodone-acetaminophen (NORCO/VICODIN) 5-325 MG tablet; Take 1 tablet by mouth every 6 (six) hours as needed for up to 3 days for moderate pain.    Follow Up Instructions: As needed.     I discussed the assessment and treatment plan with the patient. The patient was provided an opportunity to ask questions and all were answered. The patient agreed with the plan and demonstrated an understanding of the instructions.   The patient was advised to call back or seek an in-person evaluation if the symptoms worsen or if the condition fails to improve as anticipated.  The above assessment and management plan was discussed with the patient. The patient verbalized understanding of and has agreed to the management plan. Patient is aware to call the clinic if symptoms persist or worsen. Patient is aware when to return to the clinic for a follow-up visit. Patient educated on when it is appropriate to go to the emergency department.   Time call ended:  1352  I provided 13 minutes of  non face-to-face time during this encounter.    Gabriel Earing, FNP

## 2021-04-03 DIAGNOSIS — Z419 Encounter for procedure for purposes other than remedying health state, unspecified: Secondary | ICD-10-CM | POA: Diagnosis not present

## 2021-04-04 ENCOUNTER — Encounter: Payer: Self-pay | Admitting: Orthopedic Surgery

## 2021-04-04 ENCOUNTER — Ambulatory Visit: Payer: Medicaid Other | Admitting: Orthopedic Surgery

## 2021-04-04 ENCOUNTER — Ambulatory Visit: Payer: Medicaid Other

## 2021-04-04 ENCOUNTER — Other Ambulatory Visit: Payer: Self-pay

## 2021-04-04 DIAGNOSIS — S99911A Unspecified injury of right ankle, initial encounter: Secondary | ICD-10-CM

## 2021-04-04 DIAGNOSIS — S82831A Other fracture of upper and lower end of right fibula, initial encounter for closed fracture: Secondary | ICD-10-CM | POA: Diagnosis not present

## 2021-04-04 MED ORDER — ASPIRIN EC 81 MG PO TBEC: 81.0000 mg | DELAYED_RELEASE_TABLET | Freq: Every day | ORAL | 0 refills | Status: AC

## 2021-04-04 NOTE — Patient Instructions (Signed)
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
## 2021-04-06 MED ORDER — HYDROCODONE-ACETAMINOPHEN 5-325 MG PO TABS: 1.0000 | ORAL_TABLET | Freq: Four times a day (QID) | ORAL | 0 refills | Status: DC | PRN

## 2021-04-09 ENCOUNTER — Other Ambulatory Visit: Payer: Self-pay | Admitting: Family Medicine

## 2021-04-09 DIAGNOSIS — I10 Essential (primary) hypertension: Secondary | ICD-10-CM

## 2021-04-09 MED ORDER — AMLODIPINE BESYLATE 10 MG PO TABS: 10.0000 mg | ORAL_TABLET | Freq: Every day | ORAL | 0 refills | Status: DC

## 2021-04-11 ENCOUNTER — Other Ambulatory Visit: Payer: Self-pay

## 2021-04-11 ENCOUNTER — Encounter: Payer: Self-pay | Admitting: Family Medicine

## 2021-04-11 ENCOUNTER — Ambulatory Visit: Payer: Medicaid Other | Admitting: Family Medicine

## 2021-04-11 VITALS — BP 129/86 | HR 102 | Temp 99.3°F | Ht 63.0 in | Wt 218.0 lb

## 2021-04-11 DIAGNOSIS — R5383 Other fatigue: Secondary | ICD-10-CM | POA: Diagnosis not present

## 2021-04-11 DIAGNOSIS — F410 Panic disorder [episodic paroxysmal anxiety] without agoraphobia: Secondary | ICD-10-CM | POA: Diagnosis not present

## 2021-04-11 DIAGNOSIS — F321 Major depressive disorder, single episode, moderate: Secondary | ICD-10-CM

## 2021-04-11 DIAGNOSIS — R61 Generalized hyperhidrosis: Secondary | ICD-10-CM

## 2021-04-11 DIAGNOSIS — R682 Dry mouth, unspecified: Secondary | ICD-10-CM | POA: Diagnosis not present
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
## 2021-04-11 NOTE — Patient Instructions (Signed)

## 2021-04-12 ENCOUNTER — Other Ambulatory Visit: Payer: Self-pay | Admitting: Family Medicine

## 2021-04-12 ENCOUNTER — Encounter: Payer: Self-pay | Admitting: Family Medicine

## 2021-04-12 DIAGNOSIS — F419 Anxiety disorder, unspecified: Secondary | ICD-10-CM

## 2021-04-12 DIAGNOSIS — F321 Major depressive disorder, single episode, moderate: Secondary | ICD-10-CM

## 2021-04-12 DIAGNOSIS — F410 Panic disorder [episodic paroxysmal anxiety] without agoraphobia: Secondary | ICD-10-CM

## 2021-04-12 LAB — BMP8+EGFR
BUN/Creatinine Ratio: 14 (ref 9–23)
BUN: 13 mg/dL (ref 6–20)
CO2: 24 mmol/L (ref 20–29)
Calcium: 9.5 mg/dL (ref 8.7–10.2)
Chloride: 103 mmol/L (ref 96–106)
Creatinine, Ser: 0.92 mg/dL (ref 0.57–1.00)
Glucose: 95 mg/dL (ref 70–99)
Potassium: 4.2 mmol/L (ref 3.5–5.2)
Sodium: 140 mmol/L (ref 134–144)
eGFR: 82 mL/min/{1.73_m2} (ref >59)

## 2021-04-12 LAB — ANEMIA PROFILE B
Basophils Absolute: 0.1 10*3/uL (ref 0.0–0.2)
Basos: 1 %
EOS (ABSOLUTE): 0.4 10*3/uL (ref 0.0–0.4)
Eos: 5 %
Ferritin: 53 ng/mL (ref 15–150)
Folate: 14.8 ng/mL (ref >3.0)
Hematocrit: 42.1 % (ref 34.0–46.6)
Hemoglobin: 14 g/dL (ref 11.1–15.9)
Immature Grans (Abs): 0 10*3/uL (ref 0.0–0.1)
Immature Granulocytes: 0 %
Iron Saturation: 17 % (ref 15–55)
Iron: 49 ug/dL (ref 27–159)
Lymphocytes Absolute: 2 10*3/uL (ref 0.7–3.1)
Lymphs: 26 %
MCH: 28.2 pg (ref 26.6–33.0)
MCHC: 33.3 g/dL (ref 31.5–35.7)
MCV: 85 fL (ref 79–97)
Monocytes Absolute: 0.5 10*3/uL (ref 0.1–0.9)
Monocytes: 7 %
Neutrophils Absolute: 4.6 10*3/uL (ref 1.4–7.0)
Neutrophils: 61 %
Platelets: 318 10*3/uL (ref 150–450)
RBC: 4.97 x10E6/uL (ref 3.77–5.28)
RDW: 13.1 % (ref 11.7–15.4)
Retic Ct Pct: 1.2 % (ref 0.6–2.6)
Total Iron Binding Capacity: 282 ug/dL (ref 250–450)
UIBC: 233 ug/dL (ref 131–425)
Vitamin B-12: 574 pg/mL (ref 232–1245)
WBC: 7.6 10*3/uL (ref 3.4–10.8)

## 2021-04-12 LAB — TSH: TSH: 1.31 u[IU]/mL (ref 0.450–4.500)

## 2021-04-12 MED ORDER — DESVENLAFAXINE SUCCINATE ER 100 MG PO TB24: 100.0000 mg | ORAL_TABLET | Freq: Every day | ORAL | 0 refills | Status: DC

## 2021-04-18 ENCOUNTER — Ambulatory Visit (INDEPENDENT_AMBULATORY_CARE_PROVIDER_SITE_OTHER): Payer: Medicaid Other | Admitting: Orthopedic Surgery

## 2021-04-18 ENCOUNTER — Ambulatory Visit: Payer: Medicaid Other

## 2021-04-18 ENCOUNTER — Other Ambulatory Visit: Payer: Self-pay

## 2021-04-18 ENCOUNTER — Encounter: Payer: Self-pay | Admitting: Orthopedic Surgery

## 2021-04-18 VITALS — Ht 63.0 in | Wt 218.0 lb

## 2021-04-18 DIAGNOSIS — S82831D Other fracture of upper and lower end of right fibula, subsequent encounter for closed fracture with routine healing: Secondary | ICD-10-CM

## 2021-04-18 NOTE — Progress Notes (Signed)
Orthopaedic Clinic Return  Assessment: [name] is a 40 y.o. female with the following: Right distal fibula fracture; nonoperative in a cast  Plan: Radiographs stable.  She has no pain.  No swelling.  She prefers to transition to a boot.  She will remain nonweightbearing for the next 2 weeks, with gradual increase in weightbearing thereafter.  We will see her in approximately 3 weeks for repeat evaluation.  I also provided her with ankle exercises, that she can initiate immediately.   Follow-up: Return in about 3 weeks (around 05/09/2021).   Subjective:  Chief Complaint  Patient presents with   Fracture    Rt ankle fx DOI 03/30/21    History of Present Illness: [name] is a 40 y.o. female who returns to clinic for repeat evaluation of right ankle.  She sustained an ankle fracture, approximately 2-3 weeks ago.  She has been in a cast.  She is tolerated this well.  She has no pain.  She has been taking occasional ibuprofen.  She is remained nonweightbearing.  Review of Systems: No fevers or chills No numbness or tingling No chest pain No shortness of breath No bowel or bladder dysfunction No GI distress No headaches   Objective: Ht 5\' 3"  (1.6 m)   Wt 218 lb (98.9 kg)   LMP  (LMP Unknown)   BMI 38.62 kg/m   Physical Exam:  Alert and oriented.  No acute distress.  Migration of the right ankle demonstrates no swelling.  No bruising is appreciated.  Minimal tenderness to palpation at the distal fibula.  She easily gets to a plantigrade position.  Toes are warm and well-perfused.  Sensations intact over the dorsum of her foot.  IMAGING: I personally ordered and reviewed the following images:  X-ray of the right ankle was obtained in clinic today, compared to previous x-rays.  The mortise remains intact.  There is no syndesmotic disruption.  No further displacement of the distal fibula fracture.  There is no widening of the medial clear space.  Impression: Right  distal fibula fracture, in stable alignment without interval worsening   , MD 04/18/2021 11:25 AM

## 2021-04-18 NOTE — Patient Instructions (Signed)
 Ankle Exercises Ask your health care provider which exercises are safe for you. Do exercises exactly as told by your health care provider and adjust them as directed. It is normal to feel mild stretching, pulling, tightness, or mild discomfort as you do these exercises. Stop right away if you feel sudden pain or your pain gets worse. Do not begin these exercises until told by your health care provider.  Stretching and range-of-motion exercises These exercises warm up your muscles and joints and improve the movement and flexibility of your ankle. These exercises may also help to relieve pain.  Dorsiflexion/plantar flexion  Sit with your R knee straight or bent. Do not rest your foot on anything. Flex your left ankle to tilt the top of your foot toward your shin. This is called dorsiflexion. Hold this position for 5 seconds. Point your toes downward to tilt the top of your foot away from your shin. This is called plantar flexion. Hold this position for 5 seconds. Repeat 10 times. Complete this exercise 2-3 times a day.  As tolerated  Ankle alphabet  Sit with your R foot supported at your lower leg. Do not rest your foot on anything. Make sure your foot has room to move freely. Think of your R foot as a paintbrush: Move your foot to trace each letter of the alphabet in the air. Keep your hip and knee still while you trace the letters. Trace every letter from A to Z. Make the letters as large as you can without causing or increasing any discomfort.  Repeat 2-3 times. Complete this exercise 2-3 times a day.   Strengthening exercises These exercises build strength and endurance in your ankle. Endurance is the ability to use your muscles for a long time, even after they get tired. Dorsiflexors These are muscles that lift your foot up. Secure a rubber exercise band or tube to an object, such as a table leg, that will stay still when the band is pulled. Secure the other end around your R  foot. Sit on the floor, facing the object with your R leg extended. The band or tube should be slightly tense when your foot is relaxed. Slowly flex your R ankle and toes to bring your foot toward your shin. Hold this position for 5 seconds. Slowly return your foot to the starting position, controlling the band as you do that. Repeat 10 times. Complete this exercise 2-3 times a day.  Plantar flexors These are muscles that push your foot down. Sit on the floor with your R leg extended. Loop a rubber exercise band or tube around the ball of your R foot. The ball of your foot is on the walking surface, right under your toes. The band or tube should be slightly tense when your foot is relaxed. Slowly point your toes downward, pushing them away from you. Hold this position for 5 seconds. Slowly release the tension in the band or tube, controlling smoothly until your foot is back in the starting position. Repeat 10 times. Complete this exercise 2-3 times a day.  Towel curls  Sit in a chair on a non-carpeted surface, and put your feet on the floor. Place a towel in front of your feet. Keeping your heel on the floor, put your R foot on the towel. Pull the towel toward you by grabbing the towel with your toes and curling them under. Keep your heel on the floor. Let your toes relax. Grab the towel again. Keep pulling the towel   it is completely underneath your foot. Repeat 10 times. Complete this exercise 2-3 times a day.  Standing plantar flexion This is an exercise in which you use your toes to lift your body's weight while standing. Stand with your feet shoulder-width apart. Keep your weight spread evenly over the width of your feet while you rise up on your toes. Use a wall or table to steady yourself if needed, but try not to use it for support. If this exercise is too easy, try these options: Shift your weight toward your R leg until you feel challenged. If told by your health care  provider, lift your uninjured leg off the floor. Hold this position for 5 seconds. Repeat 10 times. Complete this exercise 2-3 times a day.  Tandem walking Stand with one foot directly in front of the other. Slowly raise your back foot up, lifting your heel before your toes, and place it directly in front of your other foot. Continue to walk in this heel-to-toe way. Have a countertop or wall nearby to use if needed to keep your balance, but try not to hold onto anything for support.  Repeat 10 times. Complete this exercise 2-3 times a day.   Document Revised: 02/14/2018 Document Reviewed: 02/16/2018 Elsevier Patient Education  2020 [employer].

## 2021-04-30 ENCOUNTER — Encounter: Payer: Self-pay | Admitting: Orthopedic Surgery

## 2021-05-03 DIAGNOSIS — Z419 Encounter for procedure for purposes other than remedying health state, unspecified: Secondary | ICD-10-CM | POA: Diagnosis not present

## 2021-05-09 ENCOUNTER — Ambulatory Visit (INDEPENDENT_AMBULATORY_CARE_PROVIDER_SITE_OTHER): Payer: Medicaid Other | Admitting: Orthopedic Surgery

## 2021-05-09 ENCOUNTER — Encounter: Payer: Self-pay | Admitting: Orthopedic Surgery

## 2021-05-09 ENCOUNTER — Ambulatory Visit: Payer: Medicaid Other

## 2021-05-09 ENCOUNTER — Other Ambulatory Visit: Payer: Self-pay

## 2021-05-09 DIAGNOSIS — S82831D Other fracture of upper and lower end of right fibula, subsequent encounter for closed fracture with routine healing: Secondary | ICD-10-CM

## 2021-05-09 NOTE — Progress Notes (Signed)
Orthopaedic Clinic Return  Assessment: [name] is a 40 y.o. female with the following: Right distal fibula fracture; nonoperative in a cast  Plan: Repeat radiographs obtained in clinic today are stable.  She has some mild tenderness to palpation over the distal fibula.  Some mild irritation with resisted eversion.  She has developed some tightness in her heel cord as well.  Encouraged her to continue working on exercises, specifically to stretch out her heel cord.  She stated her understanding.  I offered her a follow-up appointment, but she is comfortable proceeding with exercises and her ongoing recovery on her own.  She will contact the clinic if she has any further issues.  I encouraged her to use the boot and ambulate as tolerated for the next 1-2 weeks, and then she can transition out of the boot.   Follow-up: Return if symptoms worsen or fail to improve.   Subjective:  Chief Complaint  Patient presents with   Ankle Injury    Fracture care DOI 03/30/21    History of Present Illness: [name] is a 40 y.o. female who returns to clinic for repeat evaluation of right ankle.  She sustained an ankle fracture, approximately 6 weeks ago.  Her pain is gradually improved.  She has been ambulating in her walking boot for the past few days, with limited difficulty.  She is also been ambulating at home, without the walking boot, and has been tolerating this.  She notes occasional swelling, which improves when her leg is elevated.   Review of Systems: No fevers or chills No numbness or tingling No chest pain No shortness of breath No bowel or bladder dysfunction No GI distress No headaches   Objective: There were no vitals taken for this visit.  Physical Exam:  Alert and oriented.  No acute distress.  Right ankle without deformity.  Mild swelling laterally.  Mild tenderness to palpation distal fibula.  5 degrees of dorsiflexion with her knee extended.  10 degrees with  her knee flexed.  Sensations intact over the dorsum of her foot.  No pain with resisted inversion.  Mild discomfort over the lateral ankle with resisted eversion.  IMAGING: I personally ordered and reviewed the following images:  X-ray of the right ankle was obtained in clinic today, compared to previous.  The distal fibula fracture is still visible, without interval displacement.  The mortise remains intact.  No syndesmotic disruption.  There is been no interval displacement of the distal fibula fracture.  Impression: Right distal fibular fracture in stable alignment.   Oliver Barre, MD 05/09/2021 10:04 AM

## 2021-05-09 NOTE — Patient Instructions (Signed)
Ankle Exercises Ask your health care provider which exercises are safe for you. Do exercises exactly as told by your health care provider and adjust them as directed. It is normal to feel mild stretching, pulling, tightness, or mild discomfort as you do these exercises. Stop right away if you feel sudden pain or your pain gets worse. Do not begin these exercises until told by your health care provider.  Stretching and range-of-motion exercises These exercises warm up your muscles and joints and improve the movement and flexibility of your ankle. These exercises may also help to relieve pain.  Dorsiflexion/plantar flexion  Sit with your R knee straight or bent. Do not rest your foot on anything. Flex your left ankle to tilt the top of your foot toward your shin. This is called dorsiflexion. Hold this position for 5 seconds. Point your toes downward to tilt the top of your foot away from your shin. This is called plantar flexion. Hold this position for 5 seconds. Repeat 10 times. Complete this exercise 2-3 times a day.  As tolerated  Ankle alphabet  Sit with your R foot supported at your lower leg. Do not rest your foot on anything. Make sure your foot has room to move freely. Think of your R foot as a paintbrush: Move your foot to trace each letter of the alphabet in the air. Keep your hip and knee still while you trace the letters. Trace every letter from A to Z. Make the letters as large as you can without causing or increasing any discomfort.  Repeat 2-3 times. Complete this exercise 2-3 times a day.   Strengthening exercises These exercises build strength and endurance in your ankle. Endurance is the ability to use your muscles for a long time, even after they get tired. Dorsiflexors These are muscles that lift your foot up. Secure a rubber exercise band or tube to an object, such as a table leg, that will stay still when the band is pulled. Secure the other end around your R  foot. Sit on the floor, facing the object with your R leg extended. The band or tube should be slightly tense when your foot is relaxed. Slowly flex your R ankle and toes to bring your foot toward your shin. Hold this position for 5 seconds. Slowly return your foot to the starting position, controlling the band as you do that. Repeat 10 times. Complete this exercise 2-3 times a day.  Plantar flexors These are muscles that push your foot down. Sit on the floor with your R leg extended. Loop a rubber exercise band or tube around the ball of your R foot. The ball of your foot is on the walking surface, right under your toes. The band or tube should be slightly tense when your foot is relaxed. Slowly point your toes downward, pushing them away from you. Hold this position for 5 seconds. Slowly release the tension in the band or tube, controlling smoothly until your foot is back in the starting position. Repeat 10 times. Complete this exercise 2-3 times a day.  Towel curls  Sit in a chair on a non-carpeted surface, and put your feet on the floor. Place a towel in front of your feet. Keeping your heel on the floor, put your R foot on the towel. Pull the towel toward you by grabbing the towel with your toes and curling them under. Keep your heel on the floor. Let your toes relax. Grab the towel again. Keep pulling the towel until  it is completely underneath your foot. Repeat 10 times. Complete this exercise 2-3 times a day.  Standing plantar flexion This is an exercise in which you use your toes to lift your body's weight while standing. Stand with your feet shoulder-width apart. Keep your weight spread evenly over the width of your feet while you rise up on your toes. Use a wall or table to steady yourself if needed, but try not to use it for support. If this exercise is too easy, try these options: Shift your weight toward your R leg until you feel challenged. If told by your health care  provider, lift your uninjured leg off the floor. Hold this position for 5 seconds. Repeat 10 times. Complete this exercise 2-3 times a day.  Tandem walking Stand with one foot directly in front of the other. Slowly raise your back foot up, lifting your heel before your toes, and place it directly in front of your other foot. Continue to walk in this heel-to-toe way. Have a countertop or wall nearby to use if needed to keep your balance, but try not to hold onto anything for support.  Repeat 10 times. Complete this exercise 2-3 times a day.   Document Revised: 02/14/2018 Document Reviewed: 02/16/2018 Elsevier Patient Education  2020 [employer].

## 2021-05-12 ENCOUNTER — Telehealth: Payer: Medicaid Other | Admitting: Nurse Practitioner

## 2021-05-12 DIAGNOSIS — N760 Acute vaginitis: Secondary | ICD-10-CM

## 2021-05-12 MED ORDER — METRONIDAZOLE 500 MG PO TABS: 500.0000 mg | ORAL_TABLET | Freq: Two times a day (BID) | ORAL | 0 refills | Status: AC

## 2021-05-12 NOTE — Progress Notes (Signed)
I have spent 5 minutes in review of e-visit questionnaire, review and updating patient chart, medical decision making and response to patient.  ° °[name] W Daniell Paradise, NP ° °  °

## 2021-05-12 NOTE — Progress Notes (Signed)

## 2021-05-28 ENCOUNTER — Telehealth: Payer: Medicaid Other | Admitting: Nurse Practitioner

## 2021-05-28 DIAGNOSIS — N760 Acute vaginitis: Secondary | ICD-10-CM | POA: Diagnosis not present

## 2021-05-29 MED ORDER — METRONIDAZOLE 500 MG PO TABS: 500.0000 mg | ORAL_TABLET | Freq: Two times a day (BID) | ORAL | 0 refills | Status: AC

## 2021-05-29 NOTE — Progress Notes (Signed)
E-Visit for Vaginal Symptoms  We are sorry that you are not feeling well. Here is how we plan to help! Based on what you shared with me it looks like you: May have a vaginosis due to bacteria  Vaginosis is an inflammation of the vagina that can result in discharge, itching and pain. The cause is usually a change in the normal balance of vaginal bacteria or an infection. Vaginosis can also result from reduced estrogen levels after menopause.  The most common causes of vaginosis are:   Bacterial vaginosis which results from an overgrowth of one on several organisms that are normally present in your vagina.   Yeast infections which are caused by a naturally occurring fungus called candida.   Vaginal atrophy (atrophic vaginosis) which results from the thinning of the vagina from reduced estrogen levels after menopause.   Trichomoniasis which is caused by a parasite and is commonly transmitted by sexual intercourse.  Factors that increase your risk of developing vaginosis include: Medications, such as antibiotics and steroids Uncontrolled diabetes Use of hygiene products such as bubble bath, vaginal spray or vaginal deodorant Douching Wearing damp or tight-fitting clothing Using an intrauterine device (IUD) for birth control Hormonal changes, such as those associated with pregnancy, birth control pills or menopause Sexual activity Having a sexually transmitted infection  Your treatment plan is Metronidazole or Flagyl 500mg  twice a day for 7 days.  I have electronically sent this prescription into the pharmacy that you have chosen.   Typically clear discharge with an odor is from a bacterial infection. If you have had a new sexual partner you should also consider STI testing.   Be sure to take all of the medication as directed. Stop taking any medication if you develop a rash, tongue swelling or shortness of breath. Mothers who are breast feeding should consider pumping and discarding  their breast milk while on these antibiotics. However, there is no consensus that infant exposure at these doses would be harmful.   Please note that you cannot drink alcohol while taking this antibiotic   HOME CARE:  Good hygiene may prevent some types of vaginosis from recurring and may relieve some symptoms:  Avoid baths, hot tubs and whirlpool spas. Rinse soap from your outer genital area after a shower, and dry the area well to prevent irritation. Don't use scented or harsh soaps, such as those with deodorant or antibacterial action. Avoid irritants. These include scented tampons and pads. Wipe from front to back after using the toilet. Doing so avoids spreading fecal bacteria to your vagina.  Other things that may help prevent vaginosis include:  Don't douche. Your vagina doesn't require cleansing other than normal bathing. Repetitive douching disrupts the normal organisms that reside in the vagina and can actually increase your risk of vaginal infection. Douching won't clear up a vaginal infection. Use a latex condom. Both female and female latex condoms may help you avoid infections spread by sexual contact. Wear cotton underwear. Also wear pantyhose with a cotton crotch. If you feel comfortable without it, skip wearing underwear to bed. Yeast thrives in Your symptoms should improve in the next day or two.  GET HELP RIGHT AWAY IF:  You have pain in your lower abdomen ( pelvic area or over your ovaries) You develop nausea or vomiting You develop a fever Your discharge changes or worsens You have persistent pain with intercourse You develop shortness of breath, a rapid pulse, or you faint.  These symptoms could be signs  of problems or infections that need to be evaluated by a medical provider now.  MAKE SURE YOU   Understand these instructions. Will watch your condition. Will get help right away if you are not doing well or get worse.  Thank you for choosing  an e-visit.  Your e-visit answers were reviewed by a board certified advanced clinical practitioner to complete your personal care plan. Depending upon the condition, your plan could have included both over the counter or prescription medications.  Please review your pharmacy choice. Make sure the pharmacy is open so you can pick up prescription now. If there is a problem, you may contact your provider through [employer] and have the prescription routed to another pharmacy.  Your safety is important to [place]. If you have drug allergies check your prescription carefully.   For the next 24 hours you can use MyChart to ask questions about today's visit, request a non-urgent call back, or ask for a work or school excuse. You will get an email in the next two days asking about your experience. I hope that your e-visit has been valuable and will speed your recovery.   I spent approximately 7 minutes reviewing the patient's history, current symptoms and coordinating their plan of care today.    Meds ordered this encounter  Medications   metroNIDAZOLE (FLAGYL) 500 MG tablet    Sig: Take 1 tablet (500 mg total) by mouth 2 (two) times daily for 7 days.    Dispense:  14 tablet    Refill:  0

## 2021-06-03 DIAGNOSIS — Z419 Encounter for procedure for purposes other than remedying health state, unspecified: Secondary | ICD-10-CM | POA: Diagnosis not present

## 2021-06-07 ENCOUNTER — Encounter: Payer: Self-pay | Admitting: Family Medicine

## 2021-06-07 ENCOUNTER — Ambulatory Visit: Payer: Medicaid Other | Admitting: Family Medicine

## 2021-06-07 VITALS — BP 126/88 | HR 96 | Temp 97.9°F | Ht 63.0 in | Wt 229.4 lb

## 2021-06-07 DIAGNOSIS — F410 Panic disorder [episodic paroxysmal anxiety] without agoraphobia: Secondary | ICD-10-CM

## 2021-06-07 DIAGNOSIS — Z79899 Other long term (current) drug therapy: Secondary | ICD-10-CM

## 2021-06-07 MED ORDER — LORAZEPAM 0.5 MG PO TABS: 0.2500 mg | ORAL_TABLET | ORAL | 1 refills | Status: AC | PRN

## 2021-06-07 MED ORDER — METOPROLOL SUCCINATE ER 25 MG PO TB24: 25.0000 mg | ORAL_TABLET | Freq: Every day | ORAL | 3 refills | Status: DC

## 2021-06-07 NOTE — Progress Notes (Signed)
Subjective: CC: Anxiety disorder PCP: Raliegh Ip, DO GOT:[name] is a 41 y.o. female presenting to clinic today for:  1.  Severe anxiety disorder with panic attack Patient was advanced to 100 mg of Pristiq a couple of months ago.  She continues to have panic attacks.  In fact they have gotten so bad that she went ahead and self discontinued the Wellbutrin because she had read that it may increase night sweats.  Her night sweats have actually gotten a lot better since discontinuation of the Wellbutrin.  She is continued on Pristiq.  To summarize she had been weaned off of BuSpar because it was causing unwanted side effects.  She continues to be treated with gabapentin to maintain sobriety from alcohol and she has been sober from alcohol for 1 year now today.  She and her family are going to celebrate her sobriety today with a special dinner.  She does admit that over the last 5 days she has been having increased anxiety and panic and has subsequently been using her Ativan as a result.  She had been prescribed 10 tablets of Ativan back in September and these have lasted her until present.  She did experience a severe panic attack back in November.  She has had to call for medical help on a couple of occasions due to panic symptoms.   ROS: Per HPI  Allergies  Allergen Reactions   Penicillins     Hives and itching   Sulfa Antibiotics     Hives and itching    Past Medical History:  Diagnosis Date   Anxiety    Arthritis    Depression    Endometriosis    Headache    Hx of tubal ligation 2008   Hypertension    Infection    UTI    Current Outpatient Medications:    amLODipine (NORVASC) 10 MG tablet, Take 1 tablet (10 mg total) by mouth daily., Disp: 90 tablet, Rfl: 0   buPROPion (WELLBUTRIN XL) 300 MG 24 hr tablet, Take 1 tablet by mouth once daily, Disp: 30 tablet, Rfl: 1   cetirizine (ZYRTEC) 10 MG tablet, Take 1 tablet (10 mg total) by mouth daily., Disp: 30 tablet,  Rfl: 11   desvenlafaxine (PRISTIQ) 100 MG 24 hr tablet, Take 1 tablet (100 mg total) by mouth daily., Disp: 90 tablet, Rfl: 0   gabapentin (NEURONTIN) 300 MG capsule, Take 1 capsule (300 mg total) by mouth 2 (two) times daily., Disp: 60 capsule, Rfl: 3   hydrochlorothiazide (HYDRODIURIL) 12.5 MG tablet, Take 1 tablet (12.5 mg total) by mouth daily as needed (swelling/ edema)., Disp: 90 tablet, Rfl: 3   HYDROcodone-acetaminophen (NORCO/VICODIN) 5-325 MG tablet, Take 1 tablet by mouth every 6 (six) hours as needed for moderate pain., Disp: 20 tablet, Rfl: 0   ibuprofen (ADVIL) 600 MG tablet, Take 600 mg by mouth 4 (four) times daily as needed., Disp: , Rfl:    lansoprazole (PREVACID) 30 MG capsule, Take 1 capsule (30 mg total) by mouth daily. For acid reflux, Disp: 90 capsule, Rfl: 3   LORazepam (ATIVAN) 0.5 MG tablet, Take 1 tablet (0.5 mg total) by mouth daily as needed for anxiety. USE SPARINGLY, Disp: 10 tablet, Rfl: 0   metoprolol succinate (TOPROL-XL) 25 MG 24 hr tablet, Take 0.5 tablets (12.5 mg total) by mouth daily., Disp: 45 tablet, Rfl: 3   metroNIDAZOLE (METROGEL VAGINAL) 0.75 % vaginal gel, Use 1 applicatorful vaginally immediately after intercourse as needed., Disp: 70 g,  Rfl: 1   PROAIR HFA 108 (90 Base) MCG/ACT inhaler, Inhale 1-2 puffs into the lungs as directed., Disp: , Rfl:   Current Facility-Administered Medications:    levonorgestrel (MIRENA) 20 MCG/24HR IUD 1 each, 1 each, Intrauterine, Once, Dettinger, Elige RadonJoshua A, MD Social History   Socioeconomic History   Marital status: Divorced    Spouse name: Not on file   Number of children: Not on file   Years of education: Not on file   Highest education level: Not on file  Occupational History   Occupation: CMA  Tobacco Use   Smoking status: Former    Packs/day: 1.00    Years: 5.00    Pack years: 5.00    Types: Cigarettes    Start date: 05/29/2006    Quit date: 01/07/2020    Years since quitting: 1.4   Smokeless tobacco:  Never  Vaping Use   Vaping Use: Some days  Substance and Sexual Activity   Alcohol use: Yes    Comment: occasional   Drug use: No   Sexual activity: Yes    Birth control/protection: Injection    Comment: BTL  Other Topics Concern   Not on file  Social History Narrative   Not on file   Social Determinants of Health   Financial Resource Strain: Not on file  Food Insecurity: Not on file  Transportation Needs: Not on file  Physical Activity: Not on file  Stress: Not on file  Social Connections: Not on file  Intimate Partner Violence: Not on file   Family History  Problem Relation Age of Onset   Depression Mother    Stroke Mother    Diabetes Father    Hypertension Father    Stroke Father    Heart attack Father 30   Hypertension Sister    Hypertension Brother    Heart attack Brother 1727       x3 MI (twin bro)   CAD Brother    Asthma Daughter    Breast cancer Paternal Grandmother 1145   Hypertension Brother    Healthy Daughter     Objective: Office vital signs reviewed. BP 126/88    Pulse 96    Temp 97.9 F (36.6 C)    Ht 5\' 3"  (1.6 m)    Wt 229 lb 6.4 oz (104.1 kg)    LMP  (LMP Unknown)    SpO2 96%    BMI 40.64 kg/m   Physical Examination:  General: Awake, alert, morbidly obese, No acute distress Cardio: regular rate and rhythm, S1S2 heard, no murmurs appreciated Pulm: clear to auscultation bilaterally, no wheezes, rhonchi or rales; normal work of breathing on room air Skin: dry; intact; no rashes or lesions Neuro: no tremor Psych: Mood stable.  Patient very pleasant, interactive  [facility] 06/07/2021 04/11/2021 02/13/2021  Decreased Interest 2 0 1  Down, Depressed, Hopeless 3 2 1   PHQ - 2 Score 5 2 2   Altered sleeping 0 3 1  Tired, decreased energy 3 3 1   Change in appetite 2 3 3   Feeling bad or failure about yourself  3 2 0  Trouble concentrating 0 0 1  Moving slowly or fidgety/restless 0 0 3  Suicidal thoughts 0 0 0  PHQ-9 Score 13 13 11    Difficult doing work/chores Very difficult Somewhat difficult Extremely dIfficult  Some recent data might be hidden   GAD 7 : Generalized Anxiety Score 06/07/2021 04/11/2021 02/13/2021 12/20/2020  Nervous, Anxious, on Edge 3 0 2 2  Control/stop  worrying 2 3 3 3   Worry too much - different things 3 3 3 2   Trouble relaxing 0 3 1 3   Restless 0 3 1 0  Easily annoyed or irritable 3 3 3  0  Afraid - awful might happen 0 0 1 2  Total GAD 7 Score 11 15 14 12   Anxiety Difficulty Extremely difficult Very difficult Very difficult Very difficult     Assessment/ Plan: 41 y.o. female   Severe anxiety with panic - Plan: ToxASSURE Select 13 (MW), Urine, LORazepam (ATIVAN) 0.5 MG tablet  Encounter for long term benzodiazepine therapy - Plan: ToxASSURE Select 13 (MW), Urine  Worried that she continues to have severe breakthrough panic attacks that cause her to need care in the ER or that she dialed 911.  I think that these are likely manifestation of the fact that she was a long-term alcoholic and is now sober x1 year.  It is likely the fact that she no longer has that stimulus on those receptors.  Whilst I would prefer her not to utilize the Ativan, I do think that it is a medically necessary medication for her at this time.  I am going to have her increase her beta-blocker to see if perhaps this might help the nighttime heart palpitations and tachycardia that she has been experiencing but we will also give her enough Ativan such that she may use 1/2 tablet every other night.  We are going to reassess her in 1 month and if she is stable with this regimen we can consider continuing it.  She of course understands the risks of benzodiazepine class of medication.  She understands the risk of addiction to this class of medication.  The Narcotic Database has been reviewed.  There were no red flags.     No orders of the defined types were placed in this encounter.  Meds ordered this encounter  Medications    LORazepam (ATIVAN) 0.5 MG tablet    Sig: Take 0.5 tablets (0.25 mg total) by mouth every other day as needed for anxiety. USE SPARINGLY    Dispense:  15 tablet    Refill:  1   metoprolol succinate (TOPROL-XL) 25 MG 24 hr tablet    Sig: Take 1 tablet (25 mg total) by mouth daily.    Dispense:  90 tablet    Refill:  3     Jenney Brester , DO [facility] [phone]

## 2021-06-09 ENCOUNTER — Encounter: Payer: Self-pay | Admitting: Family Medicine

## 2021-06-11 ENCOUNTER — Other Ambulatory Visit: Payer: Self-pay | Admitting: Family Medicine

## 2021-06-11 ENCOUNTER — Encounter: Payer: Self-pay | Admitting: Family Medicine

## 2021-06-11 DIAGNOSIS — R002 Palpitations: Secondary | ICD-10-CM

## 2021-06-12 ENCOUNTER — Other Ambulatory Visit: Payer: Self-pay | Admitting: Family Medicine

## 2021-06-12 DIAGNOSIS — F321 Major depressive disorder, single episode, moderate: Secondary | ICD-10-CM

## 2021-06-12 DIAGNOSIS — F410 Panic disorder [episodic paroxysmal anxiety] without agoraphobia: Secondary | ICD-10-CM

## 2021-06-12 LAB — TOXASSURE SELECT 13 (MW), URINE

## 2021-06-12 MED ORDER — DESVENLAFAXINE SUCCINATE ER 50 MG PO TB24: ORAL_TABLET | ORAL | 0 refills | Status: DC

## 2021-06-13 ENCOUNTER — Encounter: Payer: Self-pay | Admitting: Family Medicine

## 2021-06-13 NOTE — Telephone Encounter (Signed)
Again, I have recommended she continue her metoprolol.  If she feels that the Pristiq was the cause of her palpitations, etc, she should consider going back on the higher dose of toprol that was prescribed.  She has been referred to cardiology already.  If she is having red flag symptoms, she should seek immediate medical attention in the ER.

## 2021-06-21 ENCOUNTER — Telehealth: Payer: Medicaid Other | Admitting: Physician Assistant

## 2021-06-21 DIAGNOSIS — A084 Viral intestinal infection, unspecified: Secondary | ICD-10-CM | POA: Diagnosis not present

## 2021-06-21 MED ORDER — ONDANSETRON 4 MG PO TBDP
4.0000 mg | ORAL_TABLET | Freq: Three times a day (TID) | ORAL | 0 refills | Status: DC | PRN
Start: 2021-06-21 — End: 2022-09-16

## 2021-06-21 NOTE — Progress Notes (Signed)
E-Visit for Nausea and Vomiting   We are sorry that you are not feeling well. Here is how we plan to help!  Based on what you have shared with me it looks like you have a Virus that is irritating your GI tract.  Vomiting is the forceful emptying of a portion of the stomach's content through the mouth.  Although nausea and vomiting can make you feel miserable, it's important to remember that these are not diseases, but rather symptoms of an underlying illness.  When we treat short term symptoms, we always caution that any symptoms that persist should be fully evaluated in a medical office.  I have prescribed a medication that will help alleviate your symptoms and allow you to stay hydrated:  Zofran 4 mg 1 tablet every 8 hours as needed for nausea and vomiting  HOME CARE: Drink clear liquids.  This is very important! Dehydration (the lack of fluid) can lead to a serious complication.  Start off with 1 tablespoon every 5 minutes for 8 hours. You may begin eating bland foods after 8 hours without vomiting.  Start with saltine crackers, white bread, rice, mashed potatoes, applesauce. After 48 hours on a bland diet, you may resume a normal diet. Try to go to sleep.  Sleep often empties the stomach and relieves the need to vomit.  GET HELP RIGHT AWAY IF:  Your symptoms do not improve or worsen within 2 days after treatment. You have a fever for over 3 days. You cannot keep down fluids after trying the medication.  MAKE SURE YOU:  Understand these instructions. Will watch your condition. Will get help right away if you are not doing well or get worse.    Thank you for choosing an e-visit.  Your e-visit answers were reviewed by a board certified advanced clinical practitioner to complete your personal care plan. Depending upon the condition, your plan could have included both over the counter or prescription medications.  Please review your pharmacy choice. Make sure the pharmacy is open so  you can pick up prescription now. If there is a problem, you may contact your provider through MyChart messaging and have the prescription routed to another pharmacy.  Your safety is important to us. If you have drug allergies check your prescription carefully.   For the next 24 hours you can use MyChart to ask questions about today's visit, request a non-urgent call back, or ask for a work or school excuse. You will get an email in the next two days asking about your experience. I hope that your e-visit has been valuable and will speed your recovery.  

## 2021-06-21 NOTE — Progress Notes (Signed)
I have spent 5 minutes in review of e-visit questionnaire, review and updating patient chart, medical decision making and response to patient.   Walter Grima Cody Jayliah Benett, PA-C    

## 2021-06-24 ENCOUNTER — Telehealth: Payer: Medicaid Other | Admitting: Family

## 2021-06-24 DIAGNOSIS — R059 Cough, unspecified: Secondary | ICD-10-CM | POA: Diagnosis not present

## 2021-06-24 DIAGNOSIS — Z6841 Body Mass Index (BMI) 40.0 and over, adult: Secondary | ICD-10-CM | POA: Diagnosis not present

## 2021-06-24 DIAGNOSIS — M791 Myalgia, unspecified site: Secondary | ICD-10-CM

## 2021-06-24 DIAGNOSIS — R112 Nausea with vomiting, unspecified: Secondary | ICD-10-CM

## 2021-06-24 DIAGNOSIS — R0602 Shortness of breath: Secondary | ICD-10-CM

## 2021-06-24 DIAGNOSIS — R197 Diarrhea, unspecified: Secondary | ICD-10-CM

## 2021-06-24 DIAGNOSIS — J40 Bronchitis, not specified as acute or chronic: Secondary | ICD-10-CM | POA: Diagnosis not present

## 2021-06-24 NOTE — Progress Notes (Signed)
Based on what you shared with me, I feel your condition warrants further evaluation and I recommend that you be seen in a face to face visit.    Given you are having shortness of breath you need to be seen in person to rule out a more serious infection.   NOTE: There will be NO CHARGE for this eVisit   If you are having a true medical emergency please call 911.      For an urgent face to face visit, [facility] has six urgent care centers for your convenience:     [facility] Health Urgent Care Center at [facility] Directions [phone] [address] [place], [place] [address]    [facility] Health Urgent Care Center [facility]) Get Driving Directions [phone] [address] [place], [place] [address]  [facility] Health Urgent Care Center [facility]) Get Driving Directions [phone] [address] [place],  [place]  [address]  [facility] Health Urgent Care at [facility] Get Driving Directions [phone] [address], [address] [place], [place] [address]   [facility] Health Urgent Care at [facility] Get Driving Directions  [phone] [address] [place], [place] [address]   [facility] Health Urgent Care at [facility] Directions [phone] [address] [place], [place] [address]  Your MyChart E-visit questionnaire answers were reviewed by a board certified advanced clinical practitioner to complete your personal care plan based on your specific symptoms.  Thank you for using e-Visits.

## 2021-06-25 ENCOUNTER — Encounter: Payer: Self-pay | Admitting: Family Medicine

## 2021-06-25 DIAGNOSIS — R051 Acute cough: Secondary | ICD-10-CM | POA: Diagnosis not present

## 2021-06-25 DIAGNOSIS — Z6841 Body Mass Index (BMI) 40.0 and over, adult: Secondary | ICD-10-CM | POA: Diagnosis not present

## 2021-06-25 DIAGNOSIS — J40 Bronchitis, not specified as acute or chronic: Secondary | ICD-10-CM | POA: Diagnosis not present

## 2021-06-26 ENCOUNTER — Encounter: Payer: Self-pay | Admitting: Family Medicine

## 2021-06-26 ENCOUNTER — Telehealth (INDEPENDENT_AMBULATORY_CARE_PROVIDER_SITE_OTHER): Payer: Medicaid Other | Admitting: Family Medicine

## 2021-06-26 DIAGNOSIS — J40 Bronchitis, not specified as acute or chronic: Secondary | ICD-10-CM

## 2021-06-26 MED ORDER — DOXYCYCLINE HYCLATE 100 MG PO TABS: 100.0000 mg | ORAL_TABLET | Freq: Two times a day (BID) | ORAL | 0 refills | Status: AC

## 2021-06-26 MED ORDER — PREDNISONE 20 MG PO TABS: 40.0000 mg | ORAL_TABLET | Freq: Every day | ORAL | 0 refills | Status: AC
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
## 2021-07-02 ENCOUNTER — Telehealth: Payer: Medicaid Other | Admitting: Nurse Practitioner

## 2021-07-02 DIAGNOSIS — F331 Major depressive disorder, recurrent, moderate: Secondary | ICD-10-CM | POA: Diagnosis not present

## 2021-07-02 DIAGNOSIS — J3081 Allergic rhinitis due to animal (cat) (dog) hair and dander: Secondary | ICD-10-CM | POA: Diagnosis not present

## 2021-07-02 MED ORDER — FLUTICASONE PROPIONATE 50 MCG/ACT NA SUSP: 2.0000 | Freq: Every day | NASAL | 6 refills | Status: DC

## 2021-07-02 NOTE — Progress Notes (Signed)
E visit for Allergic Rhinitis We are sorry that you are not feeling well.  Here is how we plan to help!  Based on what you have shared with me it looks like you have Allergic Rhinitis.  Rhinitis is when a reaction occurs that causes nasal congestion, runny nose, sneezing, and itching.  Most types of rhinitis are caused by an inflammation and are associated with symptoms in the eyes ears or throat. There are several types of rhinitis.  The most common are acute rhinitis, which is usually caused by a viral illness, allergic or seasonal rhinitis, and nonallergic or year-round rhinitis.  Nasal allergies occur certain times of the year.  Allergic rhinitis is caused when allergens in the air trigger the release of histamine in the body.  Histamine causes itching, swelling, and fluid to build up in the fragile linings of the nasal passages, sinuses and eyelids.  An itchy nose and clear discharge are common.  I recommend the following over the counter treatments: Xyzal 5 mg take 1 tablet daily  I also would recommend a nasal spray: Flonase 2 sprays into each nostril once daily and Saline 1 spray into each nostril as needed  You may also benefit from eye drops such as: Systane 1-2 driops each eye twice daily as needed  HOME CARE:  You can use an over-the-counter saline nasal spray as needed Avoid areas where there is heavy dust, mites, or molds Stay indoors on windy days during the pollen season Keep windows closed in home, at least in bedroom; use air conditioner. Use high-efficiency house air filter Keep windows closed in car, turn AC on re-circulate Avoid playing out with dog during pollen season  GET HELP RIGHT AWAY IF:  If your symptoms do not improve within 10 days You become short of breath You develop yellow or green discharge from your nose for over 3 days You have coughing fits  MAKE SURE YOU:  Understand these instructions Will watch your condition Will get help right away if  you are not doing well or get worse  Thank you for choosing an e-visit. Your e-visit answers were reviewed by a board certified advanced clinical practitioner to complete your personal care plan. Depending upon the condition, your plan could have included both over the counter or prescription medications. Please review your pharmacy choice. Be sure that the pharmacy you have chosen is open so that you can pick up your prescription now.  If there is a problem you may message your provider in MyChart to have the prescription routed to another pharmacy. Your safety is important to [place]. If you have drug allergies check your prescription carefully.  For the next 24 hours, you can use MyChart to ask questions about todays visit, request a non-urgent call back, or ask for a work or school excuse from your e-visit provider. You will get an email in the next two days asking about your experience. I hope that your e-visit has been valuable and will speed your recovery.  I spent approximately 5 minutes reviewing the patient's history, current symptoms and coordinating their plan of care today.    Meds ordered this encounter  Medications   fluticasone (FLONASE) 50 MCG/ACT nasal spray    Sig: Place 2 sprays into both nostrils daily.    Dispense:  16 g    Refill:  6

## 2021-07-04 DIAGNOSIS — Z419 Encounter for procedure for purposes other than remedying health state, unspecified: Secondary | ICD-10-CM | POA: Diagnosis not present

## 2021-07-05 ENCOUNTER — Ambulatory Visit: Payer: Medicaid Other | Admitting: Cardiology

## 2021-07-05 ENCOUNTER — Telehealth: Payer: Medicaid Other | Admitting: Physician Assistant

## 2021-07-05 DIAGNOSIS — B9789 Other viral agents as the cause of diseases classified elsewhere: Secondary | ICD-10-CM

## 2021-07-05 MED ORDER — AZELASTINE HCL 0.1 % NA SOLN: 1.0000 | Freq: Two times a day (BID) | NASAL | 0 refills | Status: DC

## 2021-07-05 NOTE — Progress Notes (Signed)
I have spent 5 minutes in review of e-visit questionnaire, review and updating patient chart, medical decision making and response to patient.   Nuel Dejaynes Cody Lynniah Janoski, PA-C    

## 2021-07-05 NOTE — Progress Notes (Signed)

## 2021-07-06 ENCOUNTER — Telehealth: Payer: Medicaid Other | Admitting: Family Medicine

## 2021-07-06 DIAGNOSIS — B9789 Other viral agents as the cause of diseases classified elsewhere: Secondary | ICD-10-CM

## 2021-07-06 DIAGNOSIS — J019 Acute sinusitis, unspecified: Secondary | ICD-10-CM

## 2021-07-06 DIAGNOSIS — Z91199 Patient's noncompliance with other medical treatment and regimen due to unspecified reason: Secondary | ICD-10-CM

## 2021-07-06 DIAGNOSIS — B001 Herpesviral vesicular dermatitis: Secondary | ICD-10-CM

## 2021-07-06 MED ORDER — MUPIROCIN CALCIUM 2 % EX CREA: 1.0000 "application " | TOPICAL_CREAM | Freq: Two times a day (BID) | CUTANEOUS | 0 refills | Status: DC

## 2021-07-06 MED ORDER — CLINDAMYCIN HCL 300 MG PO CAPS: 300.0000 mg | ORAL_CAPSULE | Freq: Three times a day (TID) | ORAL | 0 refills | Status: AC

## 2021-07-06 NOTE — Progress Notes (Signed)
E Visit for Rash  We are sorry that you are not feeling well. Here is how we plan to help! It appears you have a healing cold sore/ impetigo from the photo you upload.It is too late for antivirals as it is crusting over. We will prescribe you a topical ointment to help it heal and prevent infection of skin.    Topical mupiricin to be applied twice daily.  HOME CARE:  Take cool showers and avoid direct sunlight. Apply cool compress or wet dressings. Take a bath in an oatmeal bath.  Sprinkle content of one Aveeno packet under running faucet with comfortably warm water.  Bathe for 15-20 minutes, 1-2 times daily.  Pat dry with a towel. Do not rub the rash. Use hydrocortisone cream. Take an antihistamine like Benadryl for widespread rashes that itch.  The adult dose of Benadryl is 25-50 mg by mouth 4 times daily. Caution:  This type of medication may cause sleepiness.  Do not drink alcohol, drive, or operate dangerous machinery while taking antihistamines.  Do not take these medications if you have prostate enlargement.  Read package instructions thoroughly on all medications that you take.  GET HELP RIGHT AWAY IF:  Symptoms don't go away after treatment. Severe itching that persists. If you rash spreads or swells. If you rash begins to smell. If it blisters and opens or develops a yellow-brown crust. You develop a fever. You have a sore throat. You become short of breath.  MAKE SURE YOU:  Understand these instructions. Will watch your condition. Will get help right away if you are not doing well or get worse.  Thank you for choosing an e-visit.  Your e-visit answers were reviewed by a board certified advanced clinical practitioner to complete your personal care plan. Depending upon the condition, your plan could have included both over the counter or prescription medications.  Please review your pharmacy choice. Make sure the pharmacy is open so you can pick up prescription now. If  there is a problem, you may contact your provider through [employer] and have the prescription routed to another pharmacy.  Your safety is important to [place]. If you have drug allergies check your prescription carefully.   For the next 24 hours you can use MyChart to ask questions about today's visit, request a non-urgent call back, or ask for a work or school excuse. You will get an email in the next two days asking about your experience. I hope that your e-visit has been valuable and will speed your recovery.  I provided 5 minutes of non face-to-face time during this encounter for chart review, medication and order placement, as well as and documentation.

## 2021-07-06 NOTE — Progress Notes (Signed)
[place]   Pt recently seen by both myself this morning via an EV and yesterday by [facility] for viral sinus infection.   Pt no showed appt.

## 2021-07-06 NOTE — Addendum Note (Signed)
Addended by: Rennis Harding on: 07/06/2021 07:27 PM   Modules accepted: Orders

## 2021-07-06 NOTE — Progress Notes (Signed)
°  [place]   Was seen yesterday for similar concerns, Giving inconsistency of responses and worsening symptoms, she needs to be evaluated in person.

## 2021-07-10 ENCOUNTER — Encounter: Payer: Self-pay | Admitting: Family Medicine

## 2021-07-10 ENCOUNTER — Ambulatory Visit: Payer: Medicaid Other | Admitting: Family Medicine

## 2021-07-10 VITALS — BP 122/89 | HR 88 | Temp 97.2°F | Ht 63.0 in | Wt 232.4 lb

## 2021-07-10 DIAGNOSIS — F321 Major depressive disorder, single episode, moderate: Secondary | ICD-10-CM | POA: Diagnosis not present

## 2021-07-10 DIAGNOSIS — J40 Bronchitis, not specified as acute or chronic: Secondary | ICD-10-CM | POA: Diagnosis not present

## 2021-07-10 DIAGNOSIS — F410 Panic disorder [episodic paroxysmal anxiety] without agoraphobia: Secondary | ICD-10-CM | POA: Diagnosis not present

## 2021-07-10 DIAGNOSIS — L089 Local infection of the skin and subcutaneous tissue, unspecified: Secondary | ICD-10-CM

## 2021-07-10 MED ORDER — DOXYCYCLINE HYCLATE 100 MG PO TABS: 100.0000 mg | ORAL_TABLET | Freq: Two times a day (BID) | ORAL | 0 refills | Status: DC

## 2021-07-10 MED ORDER — PREDNISONE 10 MG (21) PO TBPK: ORAL_TABLET | ORAL | 0 refills | Status: DC

## 2021-07-10 NOTE — Progress Notes (Signed)
Subjective: CC: Mood PCP: Raliegh Ip, DO RCB:ULAGTX M Cresswell is a 41 y.o. female presenting to clinic today for:  1.  Mood Patient has been established with a psychiatrist that she really likes.  She is seeing Dr. Gracelyn Nurse in [place].  She was recently placed on Celexa 20 mg and is now using Ativan if needed.  She is been doing really well on this regimen.  She feels like this is something that is going to work well for her going forward.  Denies any concerns from mental health standpoint.  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  2.  Bronchitis Patient with cough for over 3 weeks now.  She was treated with clindamycin and prednisone really did not seem to clear up her symptoms.  She has a rash on the upper lip that has not resolved even after antibiotics.  She was given Bactroban but it burned too much so she had to discontinue it.  No fevers, hemoptysis, brown sputum reported.  No shortness of breath or wheezing reported   ROS: Per HPI  Allergies  Allergen Reactions   Penicillins     Hives and itching   Sulfa Antibiotics     Hives and itching    Azithromycin Anxiety and Palpitations    Chest pain and dizziness    Past Medical History:  Diagnosis Date   Anxiety    Arthritis    Depression    Endometriosis    Headache    Hx of tubal ligation 2008   Hypertension    Infection    UTI    Current Outpatient Medications:    amLODipine (NORVASC) 10 MG tablet, Take 1 tablet (10 mg total) by mouth daily., Disp: 90 tablet, Rfl: 0   azelastine (ASTELIN) 0.1 % nasal spray, Place 1 spray into both nostrils 2 (two) times daily. Use in each nostril as directed, Disp: 30 mL, Rfl: 0   cetirizine (ZYRTEC) 10 MG tablet, Take 1 tablet (10 mg total) by mouth daily., Disp: 30 tablet, Rfl: 11   citalopram (CELEXA) 20 MG tablet, Take 20 mg by mouth daily., Disp: , Rfl:    fluticasone (FLONASE) 50 MCG/ACT nasal spray,  Place 2 sprays into both nostrils daily., Disp: 16 g, Rfl: 6   gabapentin (NEURONTIN) 300 MG capsule, Take 1 capsule (300 mg total) by mouth 2 (two) times daily., Disp: 60 capsule, Rfl: 3   lansoprazole (PREVACID) 30 MG capsule, Take 1 capsule (30 mg total) by mouth daily. For acid reflux, Disp: 90 capsule, Rfl: 3   LORazepam (ATIVAN) 0.5 MG tablet, Take 0.5 tablets (0.25 mg total) by mouth every other day as needed for anxiety. USE SPARINGLY, Disp: 15 tablet, Rfl: 1   metoprolol succinate (TOPROL-XL) 25 MG 24 hr tablet, Take 1 tablet (25 mg total) by mouth daily., Disp: 90 tablet, Rfl: 3   metroNIDAZOLE (METROGEL VAGINAL) 0.75 % vaginal gel, Use 1 applicatorful vaginally immediately after intercourse as needed., Disp: 70 g, Rfl: 1   ondansetron (ZOFRAN-ODT) 4 MG disintegrating tablet, Take 1 tablet (4 mg total) by mouth every 8 (eight) hours as needed for nausea or vomiting., Disp: 20 tablet, Rfl: 0   PROAIR HFA 108 (90 Base) MCG/ACT inhaler, Inhale 1-2 puffs into the lungs as directed., Disp: , Rfl:    clindamycin (CLEOCIN) 300 MG capsule, Take 1 capsule (300 mg total) by mouth 3 (three) times daily for 7 days. (  Patient not taking: Reported on 07/10/2021), Disp: 21 capsule, Rfl: 0  Current Facility-Administered Medications:    levonorgestrel (MIRENA) 20 MCG/24HR IUD 1 each, 1 each, Intrauterine, Once, Dettinger, Elige Radon, MD Social History   Socioeconomic History   Marital status: Divorced    Spouse name: Not on file   Number of children: Not on file   Years of education: Not on file   Highest education level: Not on file  Occupational History   Occupation: CMA  Tobacco Use   Smoking status: Former    Packs/day: 1.00    Years: 5.00    Pack years: 5.00    Types: Cigarettes    Start date: 05/29/2006    Quit date: 01/07/2020    Years since quitting: 1.5   Smokeless tobacco: Never  Vaping Use   Vaping Use: Some days  Substance and Sexual Activity   Alcohol use: Yes    Comment:  occasional   Drug use: No   Sexual activity: Yes    Birth control/protection: Injection    Comment: BTL  Other Topics Concern   Not on file  Social History Narrative   Not on file   Social Determinants of Health   Financial Resource Strain: Not on file  Food Insecurity: Not on file  Transportation Needs: Not on file  Physical Activity: Not on file  Stress: Not on file  Social Connections: Not on file  Intimate Partner Violence: Not on file   Family History  Problem Relation Age of Onset   Depression Mother    Stroke Mother    Diabetes Father    Hypertension Father    Stroke Father    Heart attack Father 30   Hypertension Sister    Hypertension Brother    Heart attack Brother 49       x3 MI (twin bro)   CAD Brother    Asthma Daughter    Breast cancer Paternal Grandmother 39   Hypertension Brother    Healthy Daughter     Objective: Office vital signs reviewed. BP 122/89    Pulse 88    Temp (!) 97.2 F (36.2 C)    Ht 5\' 3"  (1.6 m)    Wt 232 lb 6.4 oz (105.4 kg)    SpO2 98%    Breastfeeding Unknown    BMI 41.17 kg/m   Physical Examination:  General: Awake, alert, nontoxic-appearing female, No acute distress HEENT: She has an erythematous, raw appearing rash right above the lip and below the right nare.  No exudate but there is slight crusting appreciated    Neck: No masses palpated. No lymphadenopathy    Ears: Tympanic membranes intact, normal light reflex, no erythema, no bulging    Eyes: PERRLA, extraocular membranes intact, sclera white    Nose: nasal turbinates moist, clear nasal discharge    Throat: moist mucus membranes, mild oropharyngeal erythema, no tonsillar exudate.  Airway is patent Cardio: regular rate and rhythm, S1S2 heard, no murmurs appreciated Pulm: clear to auscultation bilaterally, no wheezes, rhonchi or rales; normal work of breathing on room air Psych: Mood stable, speech normal, affect appropriate  Depression screen [facility] 2/9 07/10/2021 06/07/2021  04/11/2021  Decreased Interest 1 2 0  Down, Depressed, Hopeless 0 3 2  PHQ - 2 Score 1 5 2   Altered sleeping 2 0 3  Tired, decreased energy 3 3 3   Change in appetite 2 2 3   Feeling bad or failure about yourself  0 3 2  Trouble concentrating 0  0 0  Moving slowly or fidgety/restless 0 0 0  Suicidal thoughts 0 0 0  PHQ-9 Score 8 13 13   Difficult doing work/chores Somewhat difficult Very difficult Somewhat difficult  Some recent data might be hidden   GAD 7 : Generalized Anxiety Score 07/10/2021 06/07/2021 04/11/2021 02/13/2021  Nervous, Anxious, on Edge 1 3 0 2  Control/stop worrying 1 2 3 3   Worry too much - different things 1 3 3 3   Trouble relaxing 1 0 3 1  Restless 0 0 3 1  Easily annoyed or irritable 1 3 3 3   Afraid - awful might happen 1 0 0 1  Total GAD 7 Score 6 11 15 14   Anxiety Difficulty Somewhat difficult Extremely difficult Very difficult Very difficult     Assessment/ Plan: 41 y.o. female   Soft tissue infection - Plan: doxycycline (VIBRA-TABS) 100 MG tablet, predniSONE (STERAPRED UNI-PAK 21 TAB) 10 MG (21) TBPK tablet  Bronchitis - Plan: doxycycline (VIBRA-TABS) 100 MG tablet, predniSONE (STERAPRED UNI-PAK 21 TAB) 10 MG (21) TBPK tablet  Severe anxiety with panic  Depression, major, single episode, moderate (HCC)  We will treat with doxycycline for soft tissue infection.  Possibly impetigo but cannot tolerate topical Bactroban and has allergies to penicillins and sulfa.  She is status post treat with clinda but this is not cleared up.  Prednisone burst also sent given bronchitic symptoms.  She understands red flag signs and symptoms which warrant further evaluation.  She will follow-up as needed  Anxiety and depression are getting better with the current regimen outlined by psychiatry.  Continue to follow-up with psychiatry as scheduled.  No orders of the defined types were placed in this encounter.  No orders of the defined types were placed in this  encounter.    , DO [facility] [phone]

## 2021-07-11 ENCOUNTER — Encounter: Payer: Self-pay | Admitting: Family Medicine

## 2021-07-11 ENCOUNTER — Other Ambulatory Visit: Payer: Self-pay | Admitting: Family Medicine

## 2021-07-11 DIAGNOSIS — I10 Essential (primary) hypertension: Secondary | ICD-10-CM

## 2021-07-11 MED ORDER — AMLODIPINE BESYLATE 10 MG PO TABS: 10.0000 mg | ORAL_TABLET | Freq: Every day | ORAL | 3 refills | Status: DC

## 2021-07-12 ENCOUNTER — Encounter: Payer: Self-pay | Admitting: Family Medicine

## 2021-07-13 ENCOUNTER — Encounter: Payer: Self-pay | Admitting: Family Medicine

## 2021-07-16 ENCOUNTER — Telehealth: Payer: Medicaid Other | Admitting: Physician Assistant

## 2021-07-16 DIAGNOSIS — M549 Dorsalgia, unspecified: Secondary | ICD-10-CM | POA: Diagnosis not present

## 2021-07-17 MED ORDER — NAPROXEN 500 MG PO TABS: 500.0000 mg | ORAL_TABLET | Freq: Two times a day (BID) | ORAL | 0 refills | Status: DC

## 2021-07-17 MED ORDER — CYCLOBENZAPRINE HCL 10 MG PO TABS
10.0000 mg | ORAL_TABLET | Freq: Three times a day (TID) | ORAL | 0 refills | Status: DC | PRN
Start: 2021-07-17 — End: 2022-01-29

## 2021-07-17 NOTE — Progress Notes (Signed)

## 2021-07-17 NOTE — Progress Notes (Signed)
I have spent 5 minutes in review of e-visit questionnaire, review and updating patient chart, medical decision making and response to patient.   Blen Ransome Cody Malorie Bigford, PA-C    

## 2021-07-18 ENCOUNTER — Ambulatory Visit: Payer: Medicaid Other | Admitting: Family Medicine

## 2021-07-18 ENCOUNTER — Encounter: Payer: Self-pay | Admitting: Family Medicine

## 2021-07-18 VITALS — BP 120/88 | HR 87 | Temp 98.4°F | Ht 63.0 in | Wt 233.6 lb

## 2021-07-18 DIAGNOSIS — M791 Myalgia, unspecified site: Secondary | ICD-10-CM | POA: Diagnosis not present

## 2021-07-18 DIAGNOSIS — M2550 Pain in unspecified joint: Secondary | ICD-10-CM | POA: Diagnosis not present

## 2021-07-18 DIAGNOSIS — R208 Other disturbances of skin sensation: Secondary | ICD-10-CM | POA: Diagnosis not present

## 2021-07-18 DIAGNOSIS — E782 Mixed hyperlipidemia: Secondary | ICD-10-CM

## 2021-07-18 DIAGNOSIS — I10 Essential (primary) hypertension: Secondary | ICD-10-CM

## 2021-07-18 DIAGNOSIS — Z8249 Family history of ischemic heart disease and other diseases of the circulatory system: Secondary | ICD-10-CM

## 2021-07-18 NOTE — Patient Instructions (Signed)
Increase the gabapentin to twice daily. Running several labs to look for autoimmune etiology but suspect that this is fibromyalgia  Myofascial Pain Syndrome and Fibromyalgia Myofascial pain syndrome and fibromyalgia are both pain disorders. This pain may be felt mainly in your muscles. Myofascial pain syndrome: Always has tender points in the muscle that will cause pain when pressed (trigger points). The pain may come and go. Usually affects your neck, upper back, and shoulder areas. The pain often radiates into your arms and hands. Fibromyalgia: Has muscle pains and tenderness that come and go. Is often associated with fatigue and sleep problems. Has trigger points. Tends to be long-lasting (chronic), but is not life-threatening. Fibromyalgia and myofascial pain syndrome are not the same. However, they often occur together. If you have both conditions, each can make the other worse. Both are common and can cause enough pain and fatigue to make day-to-day activities difficult. Both can be hard to diagnose because their symptoms are common in many other conditions. What are the causes? The exact causes of these conditions are not known. What increases the risk? You are more likely to develop this condition if: You have a family history of the condition. You have certain triggers, such as: Spine disorders. An injury (trauma) or other physical stressors. Being under a lot of stress. Medical conditions such as osteoarthritis, rheumatoid arthritis, or lupus. What are the signs or symptoms? Fibromyalgia The main symptom of fibromyalgia is widespread pain and tenderness in your muscles. Pain is sometimes described as stabbing, shooting, or burning. You may also have: Tingling or numbness. Sleep problems and fatigue. Problems with attention and concentration (fibro fog). Other symptoms may include:  Bowel and bladder problems. Headaches. Visual problems. Problems with odors and  noises. Depression or mood changes. Painful menstrual periods (dysmenorrhea). Dry skin or eyes. These symptoms can vary over time. Myofascial pain syndrome Symptoms of myofascial pain syndrome include: Tight, ropy bands of muscle. Uncomfortable sensations in muscle areas. These may include aching, cramping, burning, numbness, tingling, and weakness. Difficulty moving certain parts of the body freely (poor range of motion). How is this diagnosed? This condition may be diagnosed by your symptoms and medical history. You will also have a physical exam. In general: Fibromyalgia is diagnosed if you have pain, fatigue, and other symptoms for more than 3 months, and symptoms cannot be explained by another condition. Myofascial pain syndrome is diagnosed if you have trigger points in your muscles, and those trigger points are tender and cause pain elsewhere in your body (referred pain). How is this treated? Treatment for these conditions depends on the type that you have. For fibromyalgia: Pain medicines, such as NSAIDs. Medicines for treating depression. Medicines for treating seizures. Medicines that relax the muscles. For myofascial pain: Pain medicines, such as NSAIDs. Cooling and stretching of muscles. Trigger point injections. Sound wave (ultrasound) treatments to stimulate muscles. Treating these conditions often requires a team of health care providers. These may include: Your primary care provider. Physical therapist. Complementary health care providers, such as massage therapists or acupuncturists. Psychiatrist for cognitive behavioral therapy. Follow these instructions at home: Medicines Take over-the-counter and prescription medicines only as told by your health care provider. Do not drive or use heavy machinery while taking prescription pain medicine. If you are taking prescription pain medicine, take actions to prevent or treat constipation. Your health care provider may  recommend that you: Drink enough fluid to keep your urine pale yellow. Eat foods that are high in fiber, such as  fresh fruits and vegetables, whole grains, and beans. Limit foods that are high in fat and processed sugars, such as fried or sweet foods. Take an over-the-counter or prescription medicine for constipation. Lifestyle  Exercise as directed by your health care provider or physical therapist. Practice relaxation techniques to control your stress. You may want to try: Biofeedback. Visual imagery. Hypnosis. Muscle relaxation. Yoga. Meditation. Maintain a healthy lifestyle. This includes eating a healthy diet and getting enough sleep. Do not use any products that contain nicotine or tobacco, such as cigarettes and e-cigarettes. If you need help quitting, ask your health care provider. General instructions Talk to your health care provider about complementary treatments, such as acupuncture or massage. Consider joining a support group with others who are diagnosed with this condition. Do not do activities that stress or strain your muscles. This includes repetitive motions and heavy lifting. Keep all follow-up visits as told by your health care provider. This is important. Where to find more information National Fibromyalgia Association: www.fmaware.org Arthritis Foundation: www.arthritis.org American Chronic Pain Association: www.theacpa.org Contact a health care provider if: You have new symptoms. Your symptoms get worse or your pain is severe. You have side effects from your medicines. You have trouble sleeping. Your condition is causing depression or anxiety. Summary Myofascial pain syndrome and fibromyalgia are pain disorders. Myofascial pain syndrome has tender points in the muscle that will cause pain when pressed (trigger points). Fibromyalgia also has muscle pains and tenderness that come and go, but this condition is often associated with fatigue and sleep  disturbances. Fibromyalgia and myofascial pain syndrome are not the same but often occur together, causing pain and fatigue that make day-to-day activities difficult. Treatment for fibromyalgia includes taking medicines to relax the muscles and medicines for pain, depression, or seizures. Treatment for myofascial pain syndrome includes taking medicines for pain, cooling and stretching of muscles, and injecting medicines into trigger points. Follow your health care provider's instructions for taking medicines and maintaining a healthy lifestyle. This information is not intended to replace advice given to you by your health care provider. Make sure you discuss any questions you have with your health care provider. Document Revised: 09/11/2018 Document Reviewed: 06/04/2017 Elsevier Patient Education  2022 [employer].

## 2021-07-18 NOTE — Progress Notes (Signed)
Subjective: CC: Joint pain PCP: Janora Norlander, DO [id number] [name] is a 41 y.o. female presenting to clinic today for:  1.  Polyarthralgia, polymyalgia and hyperalgesia Patient reports that she has been dealing with the symptoms for the last couple of months.  Symptoms seem to be getting gradually worse and she wonders if she may have an underlying fibromyalgia.  She does take gabapentin 300 mg at bedtime but never did increase the dose because this was being given for alcoholism.  She has been stable on her medications for mental health.  Does not report any gross joint swelling but does note that she has various joints that are quite painful sometimes.  She cannot even wear a shirt sometimes because the fabric against her skin is very aggravating.  2.  Morbid obesity Patient is very interested in pursuing something to help with morbid obesity.  Because she suffers from anxiety and depression, she does not really want to be on any stimulant type medications.  Her insurance unfortunately is not cover medications for weight loss.  She would be interested in the weight loss clinic for dietary management however   ROS: Per HPI  Allergies  Allergen Reactions   Penicillins     Hives and itching   Sulfa Antibiotics     Hives and itching    Azithromycin Anxiety and Palpitations    Chest pain and dizziness    Past Medical History:  Diagnosis Date   Anxiety    Arthritis    Depression    Endometriosis    Headache    Hx of tubal ligation 2008   Hypertension    Infection    UTI    Current Outpatient Medications:    amLODipine (NORVASC) 10 MG tablet, Take 1 tablet (10 mg total) by mouth daily., Disp: 90 tablet, Rfl: 3   azelastine (ASTELIN) 0.1 % nasal spray, Place 1 spray into both nostrils 2 (two) times daily. Use in each nostril as directed, Disp: 30 mL, Rfl: 0   cetirizine (ZYRTEC) 10 MG tablet, Take 1 tablet (10 mg total) by mouth daily., Disp: 30 tablet, Rfl: 11    citalopram (CELEXA) 20 MG tablet, Take 20 mg by mouth daily., Disp: , Rfl:    cyclobenzaprine (FLEXERIL) 10 MG tablet, Take 1 tablet (10 mg total) by mouth 3 (three) times daily as needed for muscle spasms., Disp: 15 tablet, Rfl: 0   fluticasone (FLONASE) 50 MCG/ACT nasal spray, Place 2 sprays into both nostrils daily., Disp: 16 g, Rfl: 6   gabapentin (NEURONTIN) 300 MG capsule, Take 1 capsule (300 mg total) by mouth 2 (two) times daily., Disp: 60 capsule, Rfl: 3   lansoprazole (PREVACID) 30 MG capsule, Take 1 capsule (30 mg total) by mouth daily. For acid reflux, Disp: 90 capsule, Rfl: 3   LORazepam (ATIVAN) 0.5 MG tablet, Take 0.5 tablets (0.25 mg total) by mouth every other day as needed for anxiety. USE SPARINGLY, Disp: 15 tablet, Rfl: 1   metoprolol succinate (TOPROL-XL) 25 MG 24 hr tablet, Take 1 tablet (25 mg total) by mouth daily., Disp: 90 tablet, Rfl: 3   naproxen (NAPROSYN) 500 MG tablet, Take 1 tablet (500 mg total) by mouth 2 (two) times daily with a meal., Disp: 20 tablet, Rfl: 0   ondansetron (ZOFRAN-ODT) 4 MG disintegrating tablet, Take 1 tablet (4 mg total) by mouth every 8 (eight) hours as needed for nausea or vomiting., Disp: 20 tablet, Rfl: 0   predniSONE (STERAPRED UNI-PAK 21 TAB)  10 MG (21) TBPK tablet, As directed x 6 days, Disp: 21 tablet, Rfl: 0   PROAIR HFA 108 (90 Base) MCG/ACT inhaler, Inhale 1-2 puffs into the lungs as directed., Disp: , Rfl:   Current Facility-Administered Medications:    levonorgestrel (MIRENA) 20 MCG/24HR IUD 1 each, 1 each, Intrauterine, Once, Dettinger, Fransisca Kaufmann, MD Social History   Socioeconomic History   Marital status: Divorced    Spouse name: Not on file   Number of children: Not on file   Years of education: Not on file   Highest education level: Not on file  Occupational History   Occupation: CMA  Tobacco Use   Smoking status: Former    Packs/day: 1.00    Years: 5.00    Pack years: 5.00    Types: Cigarettes    Start date:  05/29/2006    Quit date: 01/07/2020    Years since quitting: 1.5   Smokeless tobacco: Never  Vaping Use   Vaping Use: Some days  Substance and Sexual Activity   Alcohol use: Yes    Comment: occasional   Drug use: No   Sexual activity: Yes    Birth control/protection: Injection    Comment: BTL  Other Topics Concern   Not on file  Social History Narrative   Not on file   Social Determinants of Health   Financial Resource Strain: Not on file  Food Insecurity: Not on file  Transportation Needs: Not on file  Physical Activity: Not on file  Stress: Not on file  Social Connections: Not on file  Intimate Partner Violence: Not on file   Family History  Problem Relation Age of Onset   Depression Mother    Stroke Mother    Diabetes Father    Hypertension Father    Stroke Father    Heart attack Father 6   Hypertension Sister    Hypertension Brother    Heart attack Brother 46       x3 MI (twin bro)   CAD Brother    Asthma Daughter    Breast cancer Paternal Grandmother 76   Hypertension Brother    Healthy Daughter     Objective: Office vital signs reviewed. BP 120/88    Pulse 87    Temp 98.4 F (36.9 C)    Ht 5' 3"  (1.6 Caitlin)    Wt 233 lb 9.6 oz (106 kg)    SpO2 98%    Breastfeeding No    BMI 41.38 kg/Caitlin   Physical Examination:  General: Awake, alert, morbidly obese, No acute distress HEENT: Sclera white Cardio: regular rate and rhythm  Pulm: Normal work of breathing on room air MSK: Ambulating independently.  No gross joint deformity, swelling, erythema or warmth.  She does have sensitivity to touch of the skin  Assessment/ Plan: 41 y.o. female   Hyperalgesia - Plan: CBC with Differential, ANA w/Reflex if Positive, C-reactive protein, Sedimentation Rate, CMP14+EGFR, CK  Myalgia - Plan: CBC with Differential, ANA w/Reflex if Positive, C-reactive protein, Sedimentation Rate, CMP14+EGFR, CK  Polyarthralgia - Plan: CBC with Differential, ANA w/Reflex if Positive,  C-reactive protein, Sedimentation Rate, CMP14+EGFR, CK  Morbid obesity (HCC) - Plan: Amb Ref to Medical Weight Management  Mixed hyperlipidemia - Plan: Amb Ref to Medical Weight Management  Essential hypertension - Plan: Amb Ref to Medical Weight Management  Family history of early CAD - Plan: Amb Ref to Medical Weight Management  I highly suspect that the hyperalgesia and pain issues that she is experiencing are  reflective of a pain syndrome like fibromyalgia.  She has finally stabilized with her current medication regimen so I hesitate to make any adjustments.  However, she is only taking 300 mg of the gabapentin so I advised her to go ahead and increase this to 300 mg twice daily if tolerated.  We will reconvene again in the next couple of months to further adjust medication if needed at that time.  We will look for any autoimmune etiology of her symptoms.  For her morbid obesity, since she has Medicaid this essentially excludes her from any medications for weight loss.  I would rather not use something like phentermine in this patient who suffers from anxiety.  I will refer her to healthy weight and wellness.  We discussed that this is an intensive regimen of lifestyle modification and it will require some dedication.  She was amenable to this referral and understands that there may be a several month wait before she can get in  Orders Placed This Encounter  Procedures   CBC with Differential   ANA w/Reflex if Positive   C-reactive protein   Sedimentation Rate   CMP14+EGFR   CK   No orders of the defined types were placed in this encounter.    Janora Norlander, DO [place] [phone]

## 2021-07-19 LAB — CBC WITH DIFFERENTIAL/PLATELET
Basophils Absolute: 0.1 10*3/uL (ref 0.0–0.2)
Basos: 1 %
EOS (ABSOLUTE): 0.3 10*3/uL (ref 0.0–0.4)
Eos: 3 %
Hematocrit: 40.5 % (ref 34.0–46.6)
Hemoglobin: 13.3 g/dL (ref 11.1–15.9)
Immature Grans (Abs): 0 10*3/uL (ref 0.0–0.1)
Immature Granulocytes: 0 %
Lymphocytes Absolute: 2.9 10*3/uL (ref 0.7–3.1)
Lymphs: 28 %
MCH: 27.7 pg (ref 26.6–33.0)
MCHC: 32.8 g/dL (ref 31.5–35.7)
MCV: 84 fL (ref 79–97)
Monocytes Absolute: 0.5 10*3/uL (ref 0.1–0.9)
Monocytes: 5 %
Neutrophils Absolute: 6.4 10*3/uL (ref 1.4–7.0)
Neutrophils: 63 %
Platelets: 340 10*3/uL (ref 150–450)
RBC: 4.8 x10E6/uL (ref 3.77–5.28)
RDW: 13.2 % (ref 11.7–15.4)
WBC: 10.2 10*3/uL (ref 3.4–10.8)

## 2021-07-19 LAB — CMP14+EGFR
ALT: 12 IU/L (ref 0–32)
AST: 9 IU/L (ref 0–40)
Albumin/Globulin Ratio: 2 (ref 1.2–2.2)
Albumin: 4 g/dL (ref 3.8–4.8)
Alkaline Phosphatase: 98 IU/L (ref 44–121)
BUN/Creatinine Ratio: 22 (ref 9–23)
BUN: 16 mg/dL (ref 6–20)
Bilirubin Total: <0.2 mg/dL (ref 0.0–1.2)
CO2: 23 mmol/L (ref 20–29)
Calcium: 9 mg/dL (ref 8.7–10.2)
Chloride: 103 mmol/L (ref 96–106)
Creatinine, Ser: 0.72 mg/dL (ref 0.57–1.00)
Globulin, Total: 2 g/dL (ref 1.5–4.5)
Glucose: 85 mg/dL (ref 70–99)
Potassium: 4.5 mmol/L (ref 3.5–5.2)
Sodium: 139 mmol/L (ref 134–144)
Total Protein: 6 g/dL (ref 6.0–8.5)
eGFR: 110 mL/min/{1.73_m2} (ref >59)

## 2021-07-19 LAB — SEDIMENTATION RATE: Sed Rate: 7 mm/hr (ref 0–32)

## 2021-07-19 LAB — C-REACTIVE PROTEIN: CRP: 16 mg/L — ABNORMAL HIGH (ref 0–10)

## 2021-07-19 LAB — ANA W/REFLEX IF POSITIVE: Anti Nuclear Antibody (ANA): NEGATIVE

## 2021-07-19 LAB — CK: Total CK: 27 U/L — ABNORMAL LOW (ref 32–182)

## 2021-07-20 ENCOUNTER — Encounter: Payer: Self-pay | Admitting: Family Medicine

## 2021-07-25 ENCOUNTER — Ambulatory Visit: Payer: Medicaid Other | Admitting: Family Medicine
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
## 2021-07-30 DIAGNOSIS — F331 Major depressive disorder, recurrent, moderate: Secondary | ICD-10-CM | POA: Diagnosis not present

## 2021-08-01 DIAGNOSIS — Z419 Encounter for procedure for purposes other than remedying health state, unspecified: Secondary | ICD-10-CM | POA: Diagnosis not present

## 2021-08-02 ENCOUNTER — Other Ambulatory Visit: Payer: Self-pay

## 2021-08-02 ENCOUNTER — Encounter: Payer: Self-pay | Admitting: Internal Medicine

## 2021-08-02 ENCOUNTER — Ambulatory Visit (INDEPENDENT_AMBULATORY_CARE_PROVIDER_SITE_OTHER): Payer: Medicaid Other | Admitting: Internal Medicine

## 2021-08-02 VITALS — BP 124/92 | HR 82 | Ht 63.0 in | Wt 236.2 lb

## 2021-08-02 DIAGNOSIS — Z8249 Family history of ischemic heart disease and other diseases of the circulatory system: Secondary | ICD-10-CM

## 2021-08-02 DIAGNOSIS — R0789 Other chest pain: Secondary | ICD-10-CM

## 2021-08-02 DIAGNOSIS — Z8632 Personal history of gestational diabetes: Secondary | ICD-10-CM | POA: Diagnosis not present

## 2021-08-02 DIAGNOSIS — I10 Essential (primary) hypertension: Secondary | ICD-10-CM | POA: Diagnosis not present

## 2021-08-02 DIAGNOSIS — Z8759 Personal history of other complications of pregnancy, childbirth and the puerperium: Secondary | ICD-10-CM | POA: Insufficient documentation

## 2021-08-02 NOTE — Patient Instructions (Addendum)
Testing/Procedures: ?Exercise Stress Echo- Hold Metoprolol 25 mg tablet the morning of your test.  ? ?Follow-Up: ?Follow up with APP in 6 months ? ?Any Other Special Instructions Will Be Listed Below (If Applicable). ? ? ? ? ?If you need a refill on your cardiac medications before your next appointment, please call your pharmacy. ? ?

## 2021-08-09 ENCOUNTER — Other Ambulatory Visit (HOSPITAL_COMMUNITY): Payer: Medicaid Other

## 2021-08-14 ENCOUNTER — Ambulatory Visit: Payer: Medicaid Other | Admitting: Cardiology

## 2021-08-17 ENCOUNTER — Other Ambulatory Visit: Payer: Self-pay | Admitting: Family Medicine

## 2021-08-17 DIAGNOSIS — F1021 Alcohol dependence, in remission: Secondary | ICD-10-CM

## 2021-08-20 DIAGNOSIS — H5213 Myopia, bilateral: Secondary | ICD-10-CM | POA: Diagnosis not present

## 2021-08-22 ENCOUNTER — Encounter: Payer: Self-pay | Admitting: Family Medicine

## 2021-08-22 NOTE — Telephone Encounter (Signed)
Can get OTC. It is the same thing. ?

## 2021-08-27 ENCOUNTER — Ambulatory Visit (HOSPITAL_COMMUNITY)
Admission: RE | Admit: 2021-08-27 | Discharge: 2021-08-27 | Disposition: A | Discharge status: Home / Self Care | Payer: Medicaid Other | Source: Ambulatory Visit | Attending: Internal Medicine | Admitting: Internal Medicine

## 2021-08-27 DIAGNOSIS — F331 Major depressive disorder, recurrent, moderate: Secondary | ICD-10-CM | POA: Diagnosis not present

## 2021-08-27 DIAGNOSIS — R0789 Other chest pain: Secondary | ICD-10-CM | POA: Diagnosis not present

## 2021-08-27 LAB — ECHOCARDIOGRAM STRESS TEST
AV Mean grad: 5 mmHg
AV Peak grad: 10.5 mmHg
Ao pk vel: 1.62 m/s
Area-P 1/2: 4.89 cm2

## 2021-08-27 NOTE — Progress Notes (Signed)
*  PRELIMINARY RESULTS* ?Echocardiogram ?Echocardiogram Stress Test has been performed. ? ?[name] ?08/27/2021, 11:52 AM ?

## 2021-09-01 DIAGNOSIS — Z419 Encounter for procedure for purposes other than remedying health state, unspecified: Secondary | ICD-10-CM | POA: Diagnosis not present

## 2021-09-07 ENCOUNTER — Encounter: Payer: Self-pay | Admitting: Family Medicine

## 2021-09-10 NOTE — Telephone Encounter (Signed)
Needs OV.  

## 2021-09-18 ENCOUNTER — Ambulatory Visit (INDEPENDENT_AMBULATORY_CARE_PROVIDER_SITE_OTHER): Payer: Medicaid Other | Admitting: Family Medicine

## 2021-09-18 ENCOUNTER — Encounter: Payer: Self-pay | Admitting: Family Medicine

## 2021-09-18 VITALS — BP 112/81 | HR 67 | Temp 97.4°F | Ht 63.0 in | Wt 229.0 lb

## 2021-09-18 DIAGNOSIS — G894 Chronic pain syndrome: Secondary | ICD-10-CM

## 2021-09-18 DIAGNOSIS — K219 Gastro-esophageal reflux disease without esophagitis: Secondary | ICD-10-CM

## 2021-09-18 DIAGNOSIS — R600 Localized edema: Secondary | ICD-10-CM

## 2021-09-18 MED ORDER — FAMOTIDINE 20 MG PO TABS: 20.0000 mg | ORAL_TABLET | Freq: Two times a day (BID) | ORAL | 3 refills | Status: DC | PRN

## 2021-09-18 NOTE — Progress Notes (Signed)
? ?Subjective: ?CC: Lower extremity edema ?PCP: Caitlin Ip, DO ?[id number] M Stencil is a 41 y.o. female presenting to clinic today for: ? ?1.  Lower extremity edema ?Patient reports that she had but a 3-day history of lower extremity edema after she came back from [place].  She notes that they did write a bus there and back and she ate quite a bit of salt laden food.  That lower extremity but has since resolved ? ?2.  Fibromyalgia ?Patient reports improvement in the fibromyalgia pain with increased dose of gabapentin 300 mg twice daily.  She is happy to continue that medication as it has been helping.  She is working now at [employer] as a Conservation officer, nature and seems to be doing fairly well with that type of activity. ? ?3.  GERD ?Patient reports that she has been having some quite severe episodes of breakthrough GERD despite use of Prevacid daily.  Sometimes it causes her to feel like she is having a heart attack or panic attack. ? ? ?ROS: Per HPI ? ?Allergies  ?Allergen Reactions  ? Penicillins   ?  Hives and itching  ? Sulfa Antibiotics   ?  Hives and itching ?  ? Azithromycin Anxiety and Palpitations  ?  Chest pain and dizziness ?  ? ?Past Medical History:  ?Diagnosis Date  ? Anxiety   ? Arthritis   ? Depression   ? Endometriosis   ? Headache   ? Hx of tubal ligation 2008  ? Hypertension   ? Infection   ? UTI  ? ? ?Current Outpatient Medications:  ?  amLODipine (NORVASC) 10 MG tablet, Take 1 tablet (10 mg total) by mouth daily., Disp: 90 tablet, Rfl: 3 ?  azelastine (ASTELIN) 0.1 % nasal spray, Place 1 spray into both nostrils 2 (two) times daily. Use in each nostril as directed, Disp: 30 mL, Rfl: 0 ?  cetirizine (ZYRTEC) 10 MG tablet, Take 1 tablet (10 mg total) by mouth daily., Disp: 30 tablet, Rfl: 11 ?  citalopram (CELEXA) 20 MG tablet, Take 20 mg by mouth daily., Disp: , Rfl:  ?  cyclobenzaprine (FLEXERIL) 10 MG tablet, Take 1 tablet (10 mg total) by mouth 3 (three) times daily as needed for muscle spasms.,  Disp: 15 tablet, Rfl: 0 ?  fluticasone (FLONASE) 50 MCG/ACT nasal spray, Place 2 sprays into both nostrils daily., Disp: 16 g, Rfl: 6 ?  gabapentin (NEURONTIN) 300 MG capsule, Take 1 capsule by mouth twice daily, Disp: 60 capsule, Rfl: 1 ?  lansoprazole (PREVACID) 30 MG capsule, Take 1 capsule (30 mg total) by mouth daily. For acid reflux, Disp: 90 capsule, Rfl: 3 ?  LORazepam (ATIVAN) 0.5 MG tablet, Take 0.5 tablets (0.25 mg total) by mouth every other day as needed for anxiety. USE SPARINGLY, Disp: 15 tablet, Rfl: 1 ?  metoprolol succinate (TOPROL-XL) 25 MG 24 hr tablet, Take 1 tablet (25 mg total) by mouth daily., Disp: 90 tablet, Rfl: 3 ?  naproxen (NAPROSYN) 500 MG tablet, Take 1 tablet (500 mg total) by mouth 2 (two) times daily with a meal., Disp: 20 tablet, Rfl: 0 ?  ondansetron (ZOFRAN-ODT) 4 MG disintegrating tablet, Take 1 tablet (4 mg total) by mouth every 8 (eight) hours as needed for nausea or vomiting., Disp: 20 tablet, Rfl: 0 ?  predniSONE (STERAPRED UNI-PAK 21 TAB) 10 MG (21) TBPK tablet, As directed x 6 days, Disp: 21 tablet, Rfl: 0 ?  PROAIR HFA 108 (90 Base) MCG/ACT inhaler, Inhale 1-2 puffs into  the lungs as directed., Disp: , Rfl:  ? ?Current Facility-Administered Medications:  ?  levonorgestrel (MIRENA) 20 MCG/24HR IUD 1 each, 1 each, Intrauterine, Once, [name], Caitlin Radon, MD ?Social History  ? ?Socioeconomic History  ? Marital status: Divorced  ?  Spouse name: Not on file  ? Number of children: Not on file  ? Years of education: Not on file  ? Highest education level: Not on file  ?Occupational History  ? Occupation: CMA  ?Tobacco Use  ? Smoking status: Former  ?  Packs/day: 1.00  ?  Years: 5.00  ?  Pack years: 5.00  ?  Types: Cigarettes  ?  Start date: 05/29/2006  ?  Quit date: 01/07/2020  ?  Years since quitting: 1.6  ? Smokeless tobacco: Never  ?Vaping Use  ? Vaping Use: Some days  ?Substance and Sexual Activity  ? Alcohol use: Yes  ?  Comment: occasional  ? Drug use: No  ? Sexual  activity: Yes  ?  Birth control/protection: Injection  ?  Comment: BTL  ?Other Topics Concern  ? Not on file  ?Social History Narrative  ? Not on file  ? ?Social Determinants of Health  ? ?Financial Resource Strain: Not on file  ?Food Insecurity: Not on file  ?Transportation Needs: Not on file  ?Physical Activity: Not on file  ?Stress: Not on file  ?Social Connections: Not on file  ?Intimate Partner Violence: Not on file  ? ?Family History  ?Problem Relation Age of Onset  ? Depression Mother   ? Stroke Mother   ? Diabetes Father   ? Hypertension Father   ? Stroke Father   ? Heart attack Father 30  ? Hypertension Sister   ? Hypertension Brother   ? Heart attack Brother 27  ?     x3 MI (twin bro)  ? CAD Brother   ? Asthma Daughter   ? Breast cancer Paternal Grandmother 46  ? Hypertension Brother   ? Healthy Daughter   ? ? ?Objective: ?Office vital signs reviewed. ?BP 112/81   Pulse 67   Temp (!) 97.4 ?F (36.3 ?C)   Ht 5\' 3"  (1.6 m)   Wt 229 lb (103.9 kg)   SpO2 98%   BMI 40.57 kg/m?  ? ?Physical Examination:  ?General: Awake, alert, well nourished, No acute distress ?HEENT: Sclera white.  Moist mucous membranes ?Cardio: regular rate and rhythm, S1S2 heard, no murmurs appreciated ?Pulm: clear to auscultation bilaterally, no wheezes, rhonchi or rales; normal work of breathing on room air ?MSK: Normal gait and station.  Ambulating independently ?Neuro: No tremor.  Alert and oriented ?Extremities: Warm, well perfused, no edema ? ?Assessment/ Plan: ?41 y.o. female  ? ?Lower extremity edema ? ?Gastroesophageal reflux disease without esophagitis - Plan: famotidine (PEPCID) 20 MG tablet ? ?Chronic pain syndrome ? ?Edema has resolved and there is no evidence of ongoing edema on exam ? ?GERD not controlled.  Pepcid as needed twice daily added.  Okay to continue PPI.  Discussed avoiding heavy foods, fried foods, acidic foods ? ?Chronic pain syndrome is controlled with gabapentin twice daily.  Continue current  regimen. ? ?No orders of the defined types were placed in this encounter. ? ?No orders of the defined types were placed in this encounter. ? ? ? ?20, DO ?[facility] ?[phone] ? ?

## 2021-09-19 DIAGNOSIS — F331 Major depressive disorder, recurrent, moderate: Secondary | ICD-10-CM | POA: Diagnosis not present

## 2021-10-01 DIAGNOSIS — Z419 Encounter for procedure for purposes other than remedying health state, unspecified: Secondary | ICD-10-CM | POA: Diagnosis not present

## 2021-10-08 ENCOUNTER — Ambulatory Visit: Payer: Medicaid Other | Admitting: Family Medicine

## 2021-10-08 ENCOUNTER — Encounter: Payer: Self-pay | Admitting: Family Medicine

## 2021-10-08 VITALS — BP 111/81 | HR 70 | Temp 97.1°F | Ht 63.0 in | Wt 224.4 lb

## 2021-10-08 DIAGNOSIS — R600 Localized edema: Secondary | ICD-10-CM | POA: Diagnosis not present

## 2021-10-08 DIAGNOSIS — I10 Essential (primary) hypertension: Secondary | ICD-10-CM | POA: Diagnosis not present

## 2021-10-08 MED ORDER — HYDROCHLOROTHIAZIDE 25 MG PO TABS: 25.0000 mg | ORAL_TABLET | Freq: Every day | ORAL | 3 refills | Status: DC

## 2021-10-08 MED ORDER — AMLODIPINE BESYLATE 10 MG PO TABS: 5.0000 mg | ORAL_TABLET | Freq: Every day | ORAL | 3 refills | Status: DC

## 2021-10-08 NOTE — Progress Notes (Signed)
? ?Subjective: ?CC: Lower extremity edema ?PCP: Caitlin Ip, DO ?[name] is a 41 y.o. female presenting to clinic today for: ? ?1.  Lower extremity edema ?Patient reports that she gets lower extremity edema in bilateral lower extremities at the end of the day, particularly when she has been on her feet all day for work.  She reports that sometimes has become so tight that her feet hurt.  She is not currently on any diuretic.  Currently treated with metoprolol, amlodipine.  Blood pressure has been extremely well controlled with amlodipine 10 mg.  She has compression hose at home but only wears them sometimes.  Typically the edema resolves each morning.  She reports very limited salt intake and very good water intake. ? ? ?ROS: Per HPI ? ?Allergies  ?Allergen Reactions  ? Penicillins   ?  Hives and itching  ? Sulfa Antibiotics   ?  Hives and itching ?  ? Azithromycin Anxiety and Palpitations  ?  Chest pain and dizziness ?  ? ?Past Medical History:  ?Diagnosis Date  ? Anxiety   ? Arthritis   ? Depression   ? Endometriosis   ? Headache   ? Hx of tubal ligation 2008  ? Hypertension   ? Infection   ? UTI  ? ? ?Current Outpatient Medications:  ?  azelastine (ASTELIN) 0.1 % nasal spray, Place 1 spray into both nostrils 2 (two) times daily. Use in each nostril as directed, Disp: 30 mL, Rfl: 0 ?  cetirizine (ZYRTEC) 10 MG tablet, Take 1 tablet (10 mg total) by mouth daily., Disp: 30 tablet, Rfl: 11 ?  citalopram (CELEXA) 40 MG tablet, Take 40 mg by mouth daily., Disp: , Rfl:  ?  cyclobenzaprine (FLEXERIL) 10 MG tablet, Take 1 tablet (10 mg total) by mouth 3 (three) times daily as needed for muscle spasms., Disp: 15 tablet, Rfl: 0 ?  famotidine (PEPCID) 20 MG tablet, Take 1 tablet (20 mg total) by mouth 2 (two) times daily as needed for heartburn or indigestion., Disp: 60 tablet, Rfl: 3 ?  fluticasone (FLONASE) 50 MCG/ACT nasal spray, Place 2 sprays into both nostrils daily., Disp: 16 g, Rfl: 6 ?  gabapentin  (NEURONTIN) 300 MG capsule, Take 1 capsule by mouth twice daily, Disp: 60 capsule, Rfl: 1 ?  hydrochlorothiazide (HYDRODIURIL) 25 MG tablet, Take 1 tablet (25 mg total) by mouth daily. For BP and swelling, Disp: 90 tablet, Rfl: 3 ?  lansoprazole (PREVACID) 30 MG capsule, Take 1 capsule (30 mg total) by mouth daily. For acid reflux, Disp: 90 capsule, Rfl: 3 ?  LORazepam (ATIVAN) 0.5 MG tablet, Take 0.5 tablets (0.25 mg total) by mouth every other day as needed for anxiety. USE SPARINGLY, Disp: 15 tablet, Rfl: 1 ?  metoprolol succinate (TOPROL-XL) 25 MG 24 hr tablet, Take 1 tablet (25 mg total) by mouth daily., Disp: 90 tablet, Rfl: 3 ?  naproxen (NAPROSYN) 500 MG tablet, Take 1 tablet (500 mg total) by mouth 2 (two) times daily with a meal., Disp: 20 tablet, Rfl: 0 ?  ondansetron (ZOFRAN-ODT) 4 MG disintegrating tablet, Take 1 tablet (4 mg total) by mouth every 8 (eight) hours as needed for nausea or vomiting., Disp: 20 tablet, Rfl: 0 ?  PROAIR HFA 108 (90 Base) MCG/ACT inhaler, Inhale 1-2 puffs into the lungs as directed., Disp: , Rfl:  ?  amLODipine (NORVASC) 10 MG tablet, Take 0.5 tablets (5 mg total) by mouth daily., Disp: 90 tablet, Rfl: 3 ? ?Current Facility-Administered  Medications:  ?  levonorgestrel (MIRENA) 20 MCG/24HR IUD 1 each, 1 each, Intrauterine, Once, Dettinger, Elige Radon, MD ?Social History  ? ?Socioeconomic History  ? Marital status: Divorced  ?  Spouse name: Not on file  ? Number of children: Not on file  ? Years of education: Not on file  ? Highest education level: Not on file  ?Occupational History  ? Occupation: CMA  ?Tobacco Use  ? Smoking status: Former  ?  Packs/day: 1.00  ?  Years: 5.00  ?  Pack years: 5.00  ?  Types: Cigarettes  ?  Start date: 05/29/2006  ?  Quit date: 01/07/2020  ?  Years since quitting: 1.7  ? Smokeless tobacco: Never  ?Vaping Use  ? Vaping Use: Some days  ?Substance and Sexual Activity  ? Alcohol use: Yes  ?  Comment: occasional  ? Drug use: No  ? Sexual activity: Yes  ?   Birth control/protection: Injection  ?  Comment: BTL  ?Other Topics Concern  ? Not on file  ?Social History Narrative  ? Not on file  ? ?Social Determinants of Health  ? ?Financial Resource Strain: Not on file  ?Food Insecurity: Not on file  ?Transportation Needs: Not on file  ?Physical Activity: Not on file  ?Stress: Not on file  ?Social Connections: Not on file  ?Intimate Partner Violence: Not on file  ? ?Family History  ?Problem Relation Age of Onset  ? Depression Mother   ? Stroke Mother   ? Diabetes Father   ? Hypertension Father   ? Stroke Father   ? Heart attack Father 30  ? Hypertension Sister   ? Hypertension Brother   ? Heart attack Brother 27  ?     x3 MI (twin bro)  ? CAD Brother   ? Asthma Daughter   ? Breast cancer Paternal Grandmother 68  ? Hypertension Brother   ? Healthy Daughter   ? ? ?Objective: ?Office vital signs reviewed. ?BP 111/81   Pulse 70   Temp (!) 97.1 ?F (36.2 ?C)   Ht 5\' 3"  (1.6 m)   Wt 224 lb 6.4 oz (101.8 kg)   SpO2 97%   BMI 39.75 kg/m?  ? ?Physical Examination:  ?General: Awake, alert, well nourished, No acute distress ?Extremities: Trace pitting edema noted on exam today of the lower extremities bilaterally ? ?Assessment/ Plan: ?41 y.o. female  ? ?Lower extremity edema - Plan: hydrochlorothiazide (HYDRODIURIL) 25 MG tablet ? ?Essential hypertension - Plan: amLODipine (NORVASC) 10 MG tablet, hydrochlorothiazide (HYDRODIURIL) 25 MG tablet ? ?Only trace edema on exam today but the photograph she shows me definitely shows at least 2+ pitting edema that occurs at the end of her workday.  I am going to have her drop her amlodipine down to 5 mg daily and we will add hydrochlorothiazide.  She will start with 12.5 mg daily for the next few days and then she can advance to 25 mg if either edema is not well controlled or if blood pressure is not below 140/90.  We will reconvene in about a month, sooner if concerns arise.  Encouraged use of compression hose, limited salt intake and  elevation of lower extremities at the end of the day ? ?No orders of the defined types were placed in this encounter. ? ?Meds ordered this encounter  ?Medications  ? amLODipine (NORVASC) 10 MG tablet  ?  Sig: Take 0.5 tablets (5 mg total) by mouth daily.  ?  Dispense:  90 tablet  ?  Refill:  3  ? hydrochlorothiazide (HYDRODIURIL) 25 MG tablet  ?  Sig: Take 1 tablet (25 mg total) by mouth daily. For BP and swelling  ?  Dispense:  90 tablet  ?  Refill:  3  ? ? ? ?Caitlin IpAshly M Arpan Eskelson, DO ?[facility] ?[phone]) [phone] ? ? ?

## 2021-10-08 NOTE — Patient Instructions (Addendum)
1/2 tablet amlodipine ?1/2 tablet of HCTZ x3 days. Advance to 1 tablet if Blood pressure goes above 140/90 OR swelling no better with half tablet. ?Wear compression hose ? ?

## 2021-10-10 ENCOUNTER — Encounter: Payer: Self-pay | Admitting: Family Medicine

## 2021-10-11 ENCOUNTER — Other Ambulatory Visit: Payer: Self-pay | Admitting: Family Medicine

## 2021-10-11 ENCOUNTER — Encounter: Payer: Self-pay | Admitting: Family Medicine

## 2021-10-11 DIAGNOSIS — I10 Essential (primary) hypertension: Secondary | ICD-10-CM

## 2021-10-11 MED ORDER — AMLODIPINE BESYLATE 5 MG PO TABS: 5.0000 mg | ORAL_TABLET | Freq: Every day | ORAL | 0 refills | Status: DC

## 2021-10-16 DIAGNOSIS — F411 Generalized anxiety disorder: Secondary | ICD-10-CM | POA: Diagnosis not present

## 2021-10-19 ENCOUNTER — Ambulatory Visit: Payer: Medicaid Other | Admitting: Family Medicine

## 2021-10-21 ENCOUNTER — Telehealth: Payer: Medicaid Other | Admitting: Nurse Practitioner

## 2021-10-21 DIAGNOSIS — Z880 Allergy status to penicillin: Secondary | ICD-10-CM | POA: Diagnosis not present

## 2021-10-21 DIAGNOSIS — Z882 Allergy status to sulfonamides status: Secondary | ICD-10-CM | POA: Diagnosis not present

## 2021-10-21 DIAGNOSIS — R079 Chest pain, unspecified: Secondary | ICD-10-CM | POA: Diagnosis not present

## 2021-10-21 DIAGNOSIS — F419 Anxiety disorder, unspecified: Secondary | ICD-10-CM | POA: Diagnosis not present

## 2021-10-21 DIAGNOSIS — I10 Essential (primary) hypertension: Secondary | ICD-10-CM | POA: Diagnosis not present

## 2021-10-21 DIAGNOSIS — M545 Low back pain, unspecified: Secondary | ICD-10-CM | POA: Diagnosis not present

## 2021-10-21 DIAGNOSIS — R0789 Other chest pain: Secondary | ICD-10-CM | POA: Diagnosis not present

## 2021-10-21 DIAGNOSIS — Z79899 Other long term (current) drug therapy: Secondary | ICD-10-CM | POA: Diagnosis not present

## 2021-10-21 DIAGNOSIS — K219 Gastro-esophageal reflux disease without esophagitis: Secondary | ICD-10-CM | POA: Diagnosis not present

## 2021-10-21 MED ORDER — BACLOFEN 10 MG PO TABS: 10.0000 mg | ORAL_TABLET | Freq: Three times a day (TID) | ORAL | 0 refills | Status: DC

## 2021-10-21 MED ORDER — CYCLOBENZAPRINE HCL 10 MG PO TABS: 10.0000 mg | ORAL_TABLET | Freq: Every day | ORAL | 0 refills | Status: DC

## 2021-10-21 MED ORDER — NAPROXEN 500 MG PO TABS: 500.0000 mg | ORAL_TABLET | Freq: Two times a day (BID) | ORAL | 0 refills | Status: DC

## 2021-10-21 NOTE — Progress Notes (Signed)

## 2021-10-21 NOTE — Addendum Note (Signed)
Addended by: Abran Cantor on: 10/21/2021 02:14 PM   Modules accepted: Orders

## 2021-10-22 ENCOUNTER — Telehealth: Payer: Self-pay

## 2021-10-22 NOTE — Telephone Encounter (Signed)
Transition Care Management Unsuccessful Follow-up Telephone Call  Date of discharge and from where:  10/21/2021 from [facility]   Attempts:  1st Attempt  Reason for unsuccessful TCM follow-up call:  Left voice message

## 2021-10-23 ENCOUNTER — Encounter: Payer: Self-pay | Admitting: Family Medicine

## 2021-10-23 NOTE — Telephone Encounter (Signed)
Transition Care Management Unsuccessful Follow-up Telephone Call  Date of discharge and from where:   10/21/2021 from [facility]     Attempts:  2nd Attempt  Reason for unsuccessful TCM follow-up call:  Left voice message

## 2021-10-24 NOTE — Telephone Encounter (Signed)
Transition Care Management Follow-up Telephone Call Date of discharge and from where: 10/21/2021 from [facility] How have you been since you were released from the hospital? Patient stated that she is feeling better and did not have any questions or concerns at this time.  Any questions or concerns? No  Items Reviewed: Did the pt receive and understand the discharge instructions provided? Yes  Medications obtained and verified? Yes  Other? No  Any new allergies since your discharge? No  Dietary orders reviewed? No Do you have support at home? Yes   Functional Questionnaire: (I = Independent and D = Dependent) ADLs: I  Bathing/Dressing- I  Meal Prep- I  Eating- I  Maintaining continence- I  Transferring/Ambulation- I  Managing Meds- I   Follow up appointments reviewed:  PCP Hospital f/u appt confirmed? No   Specialist Hospital f/u appt confirmed? No   Are transportation arrangements needed? No  If their condition worsens, is the pt aware to call PCP or go to the Emergency Dept.? Yes Was the patient provided with contact information for the PCP's office or ED? Yes Was to pt encouraged to call back with questions or concerns? Yes

## 2021-11-01 ENCOUNTER — Encounter: Payer: Self-pay | Admitting: Family Medicine

## 2021-11-01 DIAGNOSIS — Z419 Encounter for procedure for purposes other than remedying health state, unspecified: Secondary | ICD-10-CM | POA: Diagnosis not present

## 2021-11-02 NOTE — Telephone Encounter (Signed)
Bps look great.  Have her come in for visit if still having headaches

## 2021-11-07 ENCOUNTER — Encounter: Payer: Self-pay | Admitting: Family Medicine

## 2021-11-07 ENCOUNTER — Ambulatory Visit: Payer: Medicaid Other | Admitting: Family Medicine

## 2021-11-07 VITALS — BP 117/81 | HR 73 | Temp 97.5°F | Ht 63.0 in | Wt 223.8 lb

## 2021-11-07 DIAGNOSIS — I10 Essential (primary) hypertension: Secondary | ICD-10-CM | POA: Diagnosis not present

## 2021-11-07 DIAGNOSIS — R0683 Snoring: Secondary | ICD-10-CM

## 2021-11-07 DIAGNOSIS — R0681 Apnea, not elsewhere classified: Secondary | ICD-10-CM | POA: Diagnosis not present

## 2021-11-07 DIAGNOSIS — R600 Localized edema: Secondary | ICD-10-CM | POA: Diagnosis not present

## 2021-11-07 DIAGNOSIS — G4452 New daily persistent headache (NDPH): Secondary | ICD-10-CM | POA: Diagnosis not present

## 2021-11-07 MED ORDER — HYDROCHLOROTHIAZIDE 25 MG PO TABS: 25.0000 mg | ORAL_TABLET | Freq: Every day | ORAL | 3 refills | Status: DC | PRN

## 2021-11-07 NOTE — Patient Instructions (Signed)
Use the fluid pill AS NEEDED Monitor blood pressure, goal <140/90. If goes above 140/90, ok to bump the Amlodipine back to 10mg . Referral to neuro for sleep eval and f/u headache  Sleep Apnea Sleep apnea is a condition in which breathing pauses or becomes shallow during sleep. People with sleep apnea usually snore loudly. They may have times when they gasp and stop breathing for 10 seconds or more during sleep. This may happen many times during the night. Sleep apnea disrupts your sleep and keeps your body from getting the rest that it needs. This condition can increase your risk of certain health problems, including: Heart attack. Stroke. Obesity. Type 2 diabetes. Heart failure. Irregular heartbeat. High blood pressure. The goal of treatment is to help you breathe normally again. What are the causes?  The most common cause of sleep apnea is a collapsed or blocked airway. There are three kinds of sleep apnea: Obstructive sleep apnea. This kind is caused by a blocked or collapsed airway. Central sleep apnea. This kind happens when the part of the brain that controls breathing does not send the correct signals to the muscles that control breathing. Mixed sleep apnea. This is a combination of obstructive and central sleep apnea. What increases the risk? You are more likely to develop this condition if you: Are overweight. Smoke. Have a smaller than normal airway. Are older. Are female. Drink alcohol. Take sedatives or tranquilizers. Have a family history of sleep apnea. Have a tongue or tonsils that are larger than normal. What are the signs or symptoms? Symptoms of this condition include: Trouble staying asleep. Loud snoring. Morning headaches. Waking up gasping. Dry mouth or sore throat in the morning. Daytime sleepiness and tiredness. If you have daytime fatigue because of sleep apnea, you may be more likely to have: Trouble concentrating. Forgetfulness. Irritability or  mood swings. Personality changes. Feelings of depression. Sexual dysfunction. This may include loss of interest if you are female, or erectile dysfunction if you are female. How is this diagnosed? This condition may be diagnosed with: A medical history. A physical exam. A series of tests that are done while you are sleeping (sleep study). These tests are usually done in a sleep lab, but they may also be done at home. How is this treated? Treatment for this condition aims to restore normal breathing and to ease symptoms during sleep. It may involve managing health issues that can affect breathing, such as high blood pressure or obesity. Treatment may include: Sleeping on your side. Using a decongestant if you have nasal congestion. Avoiding the use of depressants, including alcohol, sedatives, and narcotics. Losing weight if you are overweight. Making changes to your diet. Quitting smoking. Using a device to open your airway while you sleep, such as: An oral appliance. This is a custom-made mouthpiece that shifts your lower jaw forward. A continuous positive airway pressure (CPAP) device. This device blows air through a mask when you breathe out (exhale). A nasal expiratory positive airway pressure (EPAP) device. This device has valves that you put into each nostril. A bi-level positive airway pressure (BIPAP) device. This device blows air through a mask when you breathe in (inhale) and breathe out (exhale). Having surgery if other treatments do not work. During surgery, excess tissue is removed to create a wider airway. Follow these instructions at home: Lifestyle Make any lifestyle changes that your health care provider recommends. Eat a healthy, well-balanced diet. Take steps to lose weight if you are overweight. Avoid using depressants,  including alcohol, sedatives, and narcotics. Do not use any products that contain nicotine or tobacco. These products include cigarettes, chewing  tobacco, and vaping devices, such as e-cigarettes. If you need help quitting, ask your health care provider. General instructions Take over-the-counter and prescription medicines only as told by your health care provider. If you were given a device to open your airway while you sleep, use it only as told by your health care provider. If you are having surgery, make sure to tell your health care provider you have sleep apnea. You may need to bring your device with you. Keep all follow-up visits. This is important. Contact a health care provider if: The device that you received to open your airway during sleep is uncomfortable or does not seem to be working. Your symptoms do not improve. Your symptoms get worse. Get help right away if: You develop: Chest pain. Shortness of breath. Discomfort in your back, arms, or stomach. You have: Trouble speaking. Weakness on one side of your body. Drooping in your face. These symptoms may represent a serious problem that is an emergency. Do not wait to see if the symptoms will go away. Get medical help right away. Call your local emergency services (911 in the [place] Do not drive yourself to the hospital. Summary Sleep apnea is a condition in which breathing pauses or becomes shallow during sleep. The most common cause is a collapsed or blocked airway. The goal of treatment is to restore normal breathing and to ease symptoms during sleep. This information is not intended to replace advice given to you by your health care provider. Make sure you discuss any questions you have with your health care provider. Document Revised: 12/27/2020 Document Reviewed: 04/28/2020 Elsevier Patient Education  2023 [employer].

## 2021-11-07 NOTE — Progress Notes (Signed)
Subjective: CC: Headaches PCP: Raliegh Ip, DO [name] is a 41 y.o. female presenting to clinic today for:  1.  Headaches Patient reports that headaches have been present ever since she started on hydrochlorothiazide.  She thinks that this may have been why it was discontinued previously.  She wakes up with a headache and goes to bed with a headache every day.  No associated phonophobia, photophobia, nausea, vomiting or balance issues.  Certainly no neurologic disturbances like gait abnormalities.  She has used Tylenol with no improvement in headache.  She reports that her pedal edema has been essentially resolved since stopping working as a seem to exacerbate the swelling when she was standing all day.  She is accompanied today by her daughter who notes that the patient does snore a lot and she has witnessed her stopping breathing in the nighttime.   ROS: Per HPI  Allergies  Allergen Reactions   Penicillins     Hives and itching   Sulfa Antibiotics     Hives and itching    Azithromycin Anxiety and Palpitations    Chest pain and dizziness    Past Medical History:  Diagnosis Date   Anxiety    Arthritis    Depression    Endometriosis    Headache    Hx of tubal ligation 2008   Hypertension    Infection    UTI    Current Outpatient Medications:    amLODipine (NORVASC) 5 MG tablet, Take 1 tablet (5 mg total) by mouth daily., Disp: 90 tablet, Rfl: 0   azelastine (ASTELIN) 0.1 % nasal spray, Place 1 spray into both nostrils 2 (two) times daily. Use in each nostril as directed, Disp: 30 mL, Rfl: 0   baclofen (LIORESAL) 10 MG tablet, Take 10 mg by mouth 3 (three) times daily., Disp: , Rfl:    cetirizine (ZYRTEC) 10 MG tablet, Take 1 tablet (10 mg total) by mouth daily., Disp: 30 tablet, Rfl: 11   citalopram (CELEXA) 40 MG tablet, Take 40 mg by mouth daily., Disp: , Rfl:    cyclobenzaprine (FLEXERIL) 10 MG tablet, Take 1 tablet (10 mg total) by mouth 3 (three)  times daily as needed for muscle spasms., Disp: 15 tablet, Rfl: 0   cyclobenzaprine (FLEXERIL) 10 MG tablet, Take 1 tablet (10 mg total) by mouth at bedtime., Disp: 15 tablet, Rfl: 0   famotidine (PEPCID) 20 MG tablet, Take 1 tablet (20 mg total) by mouth 2 (two) times daily as needed for heartburn or indigestion., Disp: 60 tablet, Rfl: 3   fluticasone (FLONASE) 50 MCG/ACT nasal spray, Place 2 sprays into both nostrils daily., Disp: 16 g, Rfl: 6   gabapentin (NEURONTIN) 300 MG capsule, Take 1 capsule by mouth twice daily, Disp: 60 capsule, Rfl: 1   hydrochlorothiazide (HYDRODIURIL) 25 MG tablet, Take 1 tablet (25 mg total) by mouth daily. For BP and swelling, Disp: 90 tablet, Rfl: 3   lansoprazole (PREVACID) 30 MG capsule, Take 1 capsule (30 mg total) by mouth daily. For acid reflux, Disp: 90 capsule, Rfl: 3   LORazepam (ATIVAN) 0.5 MG tablet, Take 0.5 tablets (0.25 mg total) by mouth every other day as needed for anxiety. USE SPARINGLY, Disp: 15 tablet, Rfl: 1   metoprolol succinate (TOPROL-XL) 25 MG 24 hr tablet, Take 1 tablet (25 mg total) by mouth daily., Disp: 90 tablet, Rfl: 3   naproxen (NAPROSYN) 500 MG tablet, Take 1 tablet (500 mg total) by mouth 2 (two) times daily with  a meal., Disp: 20 tablet, Rfl: 0   naproxen (NAPROSYN) 500 MG tablet, Take 1 tablet (500 mg total) by mouth 2 (two) times daily with a meal., Disp: 30 tablet, Rfl: 0   ondansetron (ZOFRAN-ODT) 4 MG disintegrating tablet, Take 1 tablet (4 mg total) by mouth every 8 (eight) hours as needed for nausea or vomiting., Disp: 20 tablet, Rfl: 0   PROAIR HFA 108 (90 Base) MCG/ACT inhaler, Inhale 1-2 puffs into the lungs as directed., Disp: , Rfl:   Current Facility-Administered Medications:    levonorgestrel (MIRENA) 20 MCG/24HR IUD 1 each, 1 each, Intrauterine, Once, Dettinger, Elige Radon, MD Social History   Socioeconomic History   Marital status: Divorced    Spouse name: Not on file   Number of children: Not on file   Years of  education: Not on file   Highest education level: Not on file  Occupational History   Occupation: CMA  Tobacco Use   Smoking status: Former    Packs/day: 1.00    Years: 5.00    Pack years: 5.00    Types: Cigarettes    Start date: 05/29/2006    Quit date: 01/07/2020    Years since quitting: 1.8   Smokeless tobacco: Never  Vaping Use   Vaping Use: Some days  Substance and Sexual Activity   Alcohol use: Yes    Comment: occasional   Drug use: No   Sexual activity: Yes    Birth control/protection: Injection    Comment: BTL  Other Topics Concern   Not on file  Social History Narrative   Not on file   Social Determinants of Health   Financial Resource Strain: Not on file  Food Insecurity: Not on file  Transportation Needs: Not on file  Physical Activity: Not on file  Stress: Not on file  Social Connections: Not on file  Intimate Partner Violence: Not on file   Family History  Problem Relation Age of Onset   Depression Mother    Stroke Mother    Diabetes Father    Hypertension Father    Stroke Father    Heart attack Father 30   Hypertension Sister    Hypertension Brother    Heart attack Brother 19       x3 MI (twin bro)   CAD Brother    Asthma Daughter    Breast cancer Paternal Grandmother 86   Hypertension Brother    Healthy Daughter     Objective: Office vital signs reviewed. BP 117/81   Pulse 73   Temp (!) 97.5 F (36.4 C)   Ht 5\' 3"  (1.6 m)   Wt 223 lb oz (101.5 kg)   SpO2 97%   BMI 39.64 kg/m   Physical Examination:  General: Awake, alert, well nourished, No acute distress HEENT: PERRLA, EOMI. Cardio: regular rate and rhythm, S1S2 heard, no murmurs appreciated Pulm: clear to auscultation bilaterally, no wheezes, rhonchi or rales; normal work of breathing on room air Neuro: PERRL nerves II through XII grossly intact.  Upper extremity and lower extremity sensation grossly intact.  5 out of 5 strength testing.  Cerebellar testing  normal.  Assessment/ Plan: 41 y.o. female   New daily persistent headache - Plan: Ambulatory referral to Neurology  Snoring - Plan: Ambulatory referral to Neurology  Witnessed episode of apnea - Plan: Ambulatory referral to Neurology  Lower extremity edema - Plan: hydrochlorothiazide (HYDRODIURIL) 25 MG tablet  Essential hypertension - Plan: hydrochlorothiazide (HYDRODIURIL) 25 MG tablet  Uncertain if the  daily headache is related to the HCTZ or an undiagnosed sleep apnea.  I placed a referral to neurology for consideration of eval for obstructive sleep apnea as well as evaluation for these new daily persistent headache.  Does not fit quite in with a migraine headache.  Discussed that she could use naproxen if needed as needed for headache.  Hydration, adequate rest.  Neurologic exam unremarkable and she demonstrated no red flag signs or symptoms on exam.  Blood pressure well controlled.  Okay to go to as needed use of the hydrochlorothiazide if she finds that this is causing some of these headaches.  If blood pressure goes above 140/90 we will plan to resume Norvasc 10 mg daily  No orders of the defined types were placed in this encounter.  No orders of the defined types were placed in this encounter.    Raliegh IpAshly M Atalya Dano, DO [facility] [phone]

## 2021-11-12 DIAGNOSIS — F331 Major depressive disorder, recurrent, moderate: Secondary | ICD-10-CM | POA: Diagnosis not present

## 2021-11-13 ENCOUNTER — Other Ambulatory Visit: Payer: Self-pay | Admitting: Family Medicine

## 2021-11-13 ENCOUNTER — Encounter: Payer: Self-pay | Admitting: Family Medicine

## 2021-11-13 DIAGNOSIS — F1021 Alcohol dependence, in remission: Secondary | ICD-10-CM

## 2021-11-13 DIAGNOSIS — I10 Essential (primary) hypertension: Secondary | ICD-10-CM

## 2021-11-13 MED ORDER — GABAPENTIN 300 MG PO CAPS: 300.0000 mg | ORAL_CAPSULE | Freq: Two times a day (BID) | ORAL | 1 refills | Status: DC

## 2021-11-13 MED ORDER — AMLODIPINE BESYLATE 10 MG PO TABS: 10.0000 mg | ORAL_TABLET | Freq: Every day | ORAL | 3 refills | Status: DC

## 2021-11-23 ENCOUNTER — Telehealth: Payer: Medicaid Other | Admitting: Physician Assistant

## 2021-11-23 DIAGNOSIS — M549 Dorsalgia, unspecified: Secondary | ICD-10-CM

## 2022-01-09 ENCOUNTER — Encounter: Payer: Self-pay | Admitting: Family Medicine

## 2022-01-09 ENCOUNTER — Other Ambulatory Visit: Payer: Self-pay | Admitting: Family Medicine

## 2022-01-09 ENCOUNTER — Other Ambulatory Visit: Payer: Self-pay | Admitting: *Deleted

## 2022-01-09 DIAGNOSIS — I10 Essential (primary) hypertension: Secondary | ICD-10-CM

## 2022-01-09 MED ORDER — AMLODIPINE BESYLATE 10 MG PO TABS: 10.0000 mg | ORAL_TABLET | Freq: Every day | ORAL | 0 refills | Status: DC

## 2022-01-09 MED ORDER — AMLODIPINE BESYLATE 5 MG PO TABS: 5.0000 mg | ORAL_TABLET | Freq: Every day | ORAL | 3 refills | Status: DC

## 2022-01-09 NOTE — Telephone Encounter (Signed)
done
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
## 2022-01-22 ENCOUNTER — Telehealth: Payer: Self-pay | Admitting: Physician Assistant

## 2022-01-22 DIAGNOSIS — R102 Pelvic and perineal pain: Secondary | ICD-10-CM

## 2022-01-22 NOTE — Progress Notes (Signed)
Because of vaginal burning along with lower back pain, I feel your condition warrants further evaluation and I recommend that you be seen in a face to face visit.   NOTE: There will be NO CHARGE for this eVisit   If you are having a true medical emergency please call 911.      For an urgent face to face visit, [facility] has seven urgent care centers for your convenience:     [facility] Health Urgent Care Center at [facility] Directions [phone] [address] [place], [place] [address]    [facility] Health Urgent Care Center [facility]) Get Driving Directions [phone] [address] [place], [place] [address]  [facility] Health Urgent Care Center [facility]) Get Driving Directions [phone] [address] [place],  [place]  [address]  [facility] Health Urgent Care Center [facility] at [facility] Directions  [phone] [address] [place],  [place] [address]   [facility] Health Urgent Care at [facility] Get Driving Directions [phone] [address] [place], [place] [address]   [facility] Health Urgent Care at [facility] Get Driving Directions  [phone] [address] [place], [place] [address]   [facility] Health Urgent Care at [facility] Directions [phone] [address] [place], [place] [address]  Your MyChart E-visit questionnaire answers were reviewed by a board certified advanced clinical practitioner to complete your personal care plan based on your specific symptoms.  Thank you for using e-Visits.

## 2022-01-23 ENCOUNTER — Ambulatory Visit: Payer: Medicaid Other | Admitting: Physician Assistant

## 2022-01-23 DIAGNOSIS — Z8249 Family history of ischemic heart disease and other diseases of the circulatory system: Secondary | ICD-10-CM

## 2022-01-23 DIAGNOSIS — R0789 Other chest pain: Secondary | ICD-10-CM

## 2022-01-23 DIAGNOSIS — Z8632 Personal history of gestational diabetes: Secondary | ICD-10-CM

## 2022-01-23 DIAGNOSIS — I10 Essential (primary) hypertension: Secondary | ICD-10-CM

## 2022-01-29 ENCOUNTER — Telehealth: Payer: Self-pay | Admitting: Physician Assistant

## 2022-01-29 DIAGNOSIS — M545 Low back pain, unspecified: Secondary | ICD-10-CM

## 2022-01-29 MED ORDER — CYCLOBENZAPRINE HCL 10 MG PO TABS: 10.0000 mg | ORAL_TABLET | Freq: Three times a day (TID) | ORAL | 0 refills | Status: DC | PRN

## 2022-01-29 MED ORDER — NAPROXEN 500 MG PO TABS: 500.0000 mg | ORAL_TABLET | Freq: Two times a day (BID) | ORAL | 0 refills | Status: DC

## 2022-01-29 NOTE — Progress Notes (Signed)

## 2022-01-29 NOTE — Progress Notes (Signed)
I have spent 5 minutes in review of e-visit questionnaire, review and updating patient chart, medical decision making and response to patient.   Keirstin Musil Cody Shea Swalley, PA-C    

## 2022-02-19 ENCOUNTER — Telehealth: Payer: Self-pay | Admitting: Physician Assistant

## 2022-02-19 DIAGNOSIS — J019 Acute sinusitis, unspecified: Secondary | ICD-10-CM

## 2022-02-19 DIAGNOSIS — B9789 Other viral agents as the cause of diseases classified elsewhere: Secondary | ICD-10-CM

## 2022-02-19 MED ORDER — AZELASTINE HCL 0.1 % NA SOLN: 1.0000 | Freq: Two times a day (BID) | NASAL | 0 refills | Status: DC

## 2022-02-19 NOTE — Progress Notes (Signed)
I have spent 5 minutes in review of e-visit questionnaire, review and updating patient chart, medical decision making and response to patient.   Lorna Strother Cody Leanor Voris, PA-C    

## 2022-02-19 NOTE — Progress Notes (Signed)
E-Visit for Sinus Problems  We are sorry that you are not feeling well.  Here is how we plan to help!  Based on what you have shared with me it looks like you have sinusitis.  Sinusitis is inflammation and infection in the sinus cavities of the head.  Based on your presentation I believe you most likely have Acute Viral Sinusitis.This is an infection most likely caused by a virus. There is not specific treatment for viral sinusitis other than to help you with the symptoms until the infection runs its course.  You may use an oral decongestant such as Mucinex D or if you have glaucoma or high blood pressure use plain Mucinex. Saline nasal spray help and can safely be used as often as needed for congestion, I have prescribed: Azelastine nasal spray 2 sprays in each nostril twice a day. Start this along with your Flonase nasal spray.   Some authorities believe that zinc sprays or the use of Echinacea may shorten the course of your symptoms.  Sinus infections are not as easily transmitted as other respiratory infection, however we still recommend that you avoid close contact with loved ones, especially the very young and elderly.  Remember to wash your hands thoroughly throughout the day as this is the number one way to prevent the spread of infection!  Home Care: Only take medications as instructed by your medical team. Do not take these medications with alcohol. A steam or ultrasonic humidifier can help congestion.  You can place a towel over your head and breathe in the steam from hot water coming from a faucet. Avoid close contacts especially the very young and the elderly. Cover your mouth when you cough or sneeze. Always remember to wash your hands.  Get Help Right Away If: You develop worsening fever or sinus pain. You develop a severe head ache or visual changes. Your symptoms persist after you have completed your treatment plan.  Make sure you Understand these instructions. Will watch  your condition. Will get help right away if you are not doing well or get worse.   Thank you for choosing an e-visit.  Your e-visit answers were reviewed by a board certified advanced clinical practitioner to complete your personal care plan. Depending upon the condition, your plan could have included both over the counter or prescription medications.  Please review your pharmacy choice. Make sure the pharmacy is open so you can pick up prescription now. If there is a problem, you may contact your provider through [employer] and have the prescription routed to another pharmacy.  Your safety is important to [place]. If you have drug allergies check your prescription carefully.   For the next 24 hours you can use MyChart to ask questions about today's visit, request a non-urgent call back, or ask for a work or school excuse. You will get an email in the next two days asking about your experience. I hope that your e-visit has been valuable and will speed your recovery.

## 2022-02-21 ENCOUNTER — Other Ambulatory Visit: Payer: Self-pay | Admitting: Family Medicine

## 2022-02-21 DIAGNOSIS — J301 Allergic rhinitis due to pollen: Secondary | ICD-10-CM

## 2022-04-13 ENCOUNTER — Telehealth: Payer: Self-pay | Admitting: Nurse Practitioner

## 2022-04-13 ENCOUNTER — Encounter: Payer: Self-pay | Admitting: Family Medicine

## 2022-04-13 DIAGNOSIS — B9689 Other specified bacterial agents as the cause of diseases classified elsewhere: Secondary | ICD-10-CM

## 2022-04-13 DIAGNOSIS — N760 Acute vaginitis: Secondary | ICD-10-CM

## 2022-04-13 MED ORDER — METRONIDAZOLE 500 MG PO TABS: 500.0000 mg | ORAL_TABLET | Freq: Two times a day (BID) | ORAL | 0 refills | Status: AC

## 2022-04-13 NOTE — Progress Notes (Signed)

## 2022-04-13 NOTE — Progress Notes (Signed)
I have spent 5 minutes in review of e-visit questionnaire, review and updating patient chart, medical decision making and response to patient.  ° °Nakyra Bourn W Nash Bolls, NP ° °  °

## 2022-04-14 ENCOUNTER — Other Ambulatory Visit: Payer: Self-pay | Admitting: Family Medicine

## 2022-04-14 ENCOUNTER — Encounter: Payer: Self-pay | Admitting: Family Medicine

## 2022-04-14 DIAGNOSIS — K219 Gastro-esophageal reflux disease without esophagitis: Secondary | ICD-10-CM

## 2022-04-15 ENCOUNTER — Other Ambulatory Visit: Payer: Self-pay

## 2022-04-15 ENCOUNTER — Telehealth: Payer: Self-pay

## 2022-04-15 MED ORDER — ALBUTEROL SULFATE HFA 108 (90 BASE) MCG/ACT IN AERS
1.0000 | INHALATION_SPRAY | RESPIRATORY_TRACT | 0 refills | Status: DC
  Filled 2022-04-15: qty 6.7, 25d supply, fill #0

## 2022-04-15 MED ORDER — LANSOPRAZOLE 30 MG PO CPDR: 30.0000 mg | DELAYED_RELEASE_CAPSULE | Freq: Every day | ORAL | 3 refills | Status: DC

## 2022-04-15 NOTE — Telephone Encounter (Signed)
[facility] Key: Olin Pia - PA Case ID: [id number] - Rx #: [id number] Need help? Call us at [phone] Status Sent to [facility] Lansoprazole 30MG  dr capsules Form [employer] Electronic Prior Authorization Form (2017 NCPDP) Original Claim Info 82 Non-Formulary Drug Non-Formulary Drug

## 2022-04-18 ENCOUNTER — Encounter: Payer: Self-pay | Admitting: Family Medicine

## 2022-04-18 NOTE — Telephone Encounter (Signed)
[facility] Key: Olin Pia - PA Case ID: [id number] - Rx #: [id number] Need help? Call us at [phone] Outcome Approvedon November 15 Request Reference Number: [id number]. LANSOPRAZOLE CAP 30MG  DR is approved through 10/14/2022. Your patient may now fill this prescription and it will be covered. Drug Lansoprazole 30MG  dr capsules Form [employer] Electronic Prior Authorization Form (2017 NCPDP) Original Claim Info 53 Non-Formulary Drug Non-Formulary Drug

## 2022-04-19 ENCOUNTER — Emergency Department (HOSPITAL_COMMUNITY): Payer: Self-pay

## 2022-04-19 ENCOUNTER — Emergency Department (HOSPITAL_COMMUNITY)
Admission: EM | Admit: 2022-04-19 | Discharge: 2022-04-20 | Disposition: A | Discharge status: Home / Self Care | Payer: Self-pay | Attending: Emergency Medicine | Admitting: Emergency Medicine

## 2022-04-19 DIAGNOSIS — M549 Dorsalgia, unspecified: Secondary | ICD-10-CM | POA: Insufficient documentation

## 2022-04-19 DIAGNOSIS — R0789 Other chest pain: Secondary | ICD-10-CM | POA: Insufficient documentation

## 2022-04-19 DIAGNOSIS — H538 Other visual disturbances: Secondary | ICD-10-CM | POA: Insufficient documentation

## 2022-04-19 DIAGNOSIS — R079 Chest pain, unspecified: Secondary | ICD-10-CM

## 2022-04-19 DIAGNOSIS — R202 Paresthesia of skin: Secondary | ICD-10-CM | POA: Insufficient documentation

## 2022-04-19 DIAGNOSIS — R5381 Other malaise: Secondary | ICD-10-CM | POA: Insufficient documentation

## 2022-04-19 DIAGNOSIS — I10 Essential (primary) hypertension: Secondary | ICD-10-CM | POA: Insufficient documentation

## 2022-04-19 DIAGNOSIS — Z87891 Personal history of nicotine dependence: Secondary | ICD-10-CM | POA: Insufficient documentation

## 2022-04-19 DIAGNOSIS — R61 Generalized hyperhidrosis: Secondary | ICD-10-CM | POA: Insufficient documentation

## 2022-04-19 DIAGNOSIS — R42 Dizziness and giddiness: Secondary | ICD-10-CM | POA: Insufficient documentation

## 2022-04-19 LAB — BASIC METABOLIC PANEL
Anion gap: 7 (ref 5–15)
BUN: 13 mg/dL (ref 6–20)
CO2: 24 mmol/L (ref 22–32)
Calcium: 9.1 mg/dL (ref 8.9–10.3)
Chloride: 104 mmol/L (ref 98–111)
Creatinine, Ser: 0.82 mg/dL (ref 0.44–1.00)
GFR, Estimated: >60 mL/min (ref >60)
Glucose, Bld: 109 mg/dL — ABNORMAL HIGH (ref 70–99)
Potassium: 3.6 mmol/L (ref 3.5–5.1)
Sodium: 135 mmol/L (ref 135–145)

## 2022-04-19 LAB — CBC
HCT: 40.1 % (ref 36.0–46.0)
Hemoglobin: 13.4 g/dL (ref 12.0–15.0)
MCH: 28.2 pg (ref 26.0–34.0)
MCHC: 33.4 g/dL (ref 30.0–36.0)
MCV: 84.4 fL (ref 80.0–100.0)
Platelets: 255 10*3/uL (ref 150–400)
RBC: 4.75 MIL/uL (ref 3.87–5.11)
RDW: 13.2 % (ref 11.5–15.5)
WBC: 11.3 10*3/uL — ABNORMAL HIGH (ref 4.0–10.5)
nRBC: 0 % (ref 0.0–0.2)

## 2022-04-19 LAB — TROPONIN I (HIGH SENSITIVITY)
Troponin I (High Sensitivity): 3 ng/L (ref <18)
Troponin I (High Sensitivity): <2 ng/L (ref <18)

## 2022-04-19 NOTE — Discharge Instructions (Signed)
You were evaluated in the Emergency Department and after careful evaluation, we did not find any emergent condition requiring admission or further testing in the hospital.  Your exam/testing today was overall reassuring.  Blood testing does not show any signs of heart strain or damage.  Recommend close follow-up with primary care doctor and/or cardiology to discuss your symptoms.  Please return to the Emergency Department if you experience any worsening of your condition.  Thank you for allowing [place] to be a part of your care.

## 2022-04-19 NOTE — ED Triage Notes (Signed)
Pt to ED c/o chest pain that started around 530 pm, no medications given by EMS. EKG ST. Last VS: 148/91, HR 92, RR 18, 98%RA. Reports took 1MG  Ativan prior to EMS arrival, no s/s of acute distress noted in triage.
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
## 2022-04-21 ENCOUNTER — Telehealth: Payer: Self-pay | Admitting: Family

## 2022-04-21 DIAGNOSIS — M546 Pain in thoracic spine: Secondary | ICD-10-CM

## 2022-04-21 MED ORDER — BACLOFEN 10 MG PO TABS: 10.0000 mg | ORAL_TABLET | Freq: Three times a day (TID) | ORAL | 0 refills | Status: DC

## 2022-04-21 MED ORDER — NAPROXEN 500 MG PO TABS: 500.0000 mg | ORAL_TABLET | Freq: Two times a day (BID) | ORAL | 0 refills | Status: DC

## 2022-04-21 NOTE — Progress Notes (Signed)

## 2022-04-22 ENCOUNTER — Telehealth: Payer: Self-pay | Admitting: *Deleted

## 2022-04-22 ENCOUNTER — Telehealth: Payer: Self-pay

## 2022-04-22 ENCOUNTER — Other Ambulatory Visit: Payer: Self-pay

## 2022-04-22 MED ORDER — ALBUTEROL SULFATE HFA 108 (90 BASE) MCG/ACT IN AERS: 1.0000 | INHALATION_SPRAY | RESPIRATORY_TRACT | 0 refills | Status: DC

## 2022-04-22 NOTE — Telephone Encounter (Signed)
Transition Care Management Unsuccessful Follow-up Telephone Call  Date of discharge and from where:  04/20/2022 [facility]  Attempts:  1st Attempt  Reason for unsuccessful TCM follow-up call:  Left voice message    

## 2022-04-22 NOTE — Telephone Encounter (Signed)
Fax from [employer] RE: Albuterol INH Clarification on directions - how often does pt use Is she to use Q6 please resend updated script

## 2022-04-24 NOTE — Telephone Encounter (Signed)
Transition Care Management Unsuccessful Follow-up Telephone Call  Date of discharge and from where:  04/20/22 [facility]  Attempts:  2nd Attempt  Reason for unsuccessful TCM follow-up call:  Left voice message

## 2022-04-30 NOTE — Telephone Encounter (Signed)
Transition Care Management Unsuccessful Follow-up Telephone Call  Date of discharge and from where:  04/20/2022 [name]   Attempts:  3rd Attempt  Reason for unsuccessful TCM follow-up call:  Unable to reach patient - letter sent through MyChart and mailed

## 2022-05-03 ENCOUNTER — Other Ambulatory Visit: Payer: Self-pay | Admitting: Family Medicine

## 2022-05-03 MED ORDER — ALBUTEROL SULFATE HFA 108 (90 BASE) MCG/ACT IN AERS: 1.0000 | INHALATION_SPRAY | Freq: Four times a day (QID) | RESPIRATORY_TRACT | 0 refills | Status: DC | PRN

## 2022-05-22 ENCOUNTER — Telehealth: Payer: Self-pay | Admitting: Physician Assistant

## 2022-05-22 DIAGNOSIS — J069 Acute upper respiratory infection, unspecified: Secondary | ICD-10-CM

## 2022-05-22 DIAGNOSIS — Z20818 Contact with and (suspected) exposure to other bacterial communicable diseases: Secondary | ICD-10-CM

## 2022-05-22 MED ORDER — IPRATROPIUM BROMIDE 0.03 % NA SOLN: 2.0000 | Freq: Two times a day (BID) | NASAL | 0 refills | Status: DC

## 2022-05-22 NOTE — Progress Notes (Signed)
I have spent 5 minutes in review of e-visit questionnaire, review and updating patient chart, medical decision making and response to patient.   Uldine Fuster Cody Aasia Peavler, PA-C    

## 2022-05-22 NOTE — Progress Notes (Signed)

## 2022-05-23 ENCOUNTER — Telehealth: Payer: Self-pay | Admitting: Emergency Medicine

## 2022-05-23 DIAGNOSIS — J069 Acute upper respiratory infection, unspecified: Secondary | ICD-10-CM

## 2022-05-23 MED ORDER — CEFDINIR 300 MG PO CAPS: 300.0000 mg | ORAL_CAPSULE | Freq: Two times a day (BID) | ORAL | 0 refills | Status: DC

## 2022-05-23 NOTE — Addendum Note (Signed)
Addended by: Waldon Merl on: 05/23/2022 05:33 PM   Modules accepted: Orders

## 2022-05-23 NOTE — Progress Notes (Signed)
Hello [name],   I see you did an evisit yesterday with similar symptoms. I suspect you do have an upper respiratory infection. If you feel something else might be wrong, I suggest you be seen in person either by your primary care provider or at an urgent care.   Kind regards,  Rica Mast, PhD, FNP-BC [facility]     NOTE: There will be NO CHARGE for this eVisit   If you are having a true medical emergency please call 911.      For an urgent face to face visit, [facility] has seven urgent care centers for your convenience:     [facility] Health Urgent Care Center at [facility] Directions [phone] [address] [place], [place] [address]    [facility] Health Urgent Care Center [facility]) Get Driving Directions [phone] [address] [place], [place] [address]  [facility] Health Urgent Care Center [facility]) Get Driving Directions [phone] [address] [place],  [place]  [address]  [facility] Health Urgent Care Center [facility] - at [employer] Directions  [phone] [address] [place],  [place] [address]   [facility] Health Urgent Care at [facility] Get Driving Directions [phone] [address] [place], [place] [address]   [facility] Health Urgent Care at [facility] Get Driving Directions  [phone] [address] [place], [place] [address]   [facility] Health Urgent Care at [facility] Directions [phone] [address], [address] [place], [place] [address]  Your MyChart E-visit questionnaire answers were reviewed by a board certified advanced clinical practitioner to complete your personal care plan based on your specific symptoms.  Thank you for using e-Visits.

## 2022-06-09 ENCOUNTER — Other Ambulatory Visit: Payer: Self-pay | Admitting: Family Medicine

## 2022-06-10 ENCOUNTER — Encounter: Payer: Self-pay | Admitting: Family Medicine

## 2022-06-10 ENCOUNTER — Other Ambulatory Visit: Payer: Self-pay | Admitting: Family Medicine

## 2022-06-10 DIAGNOSIS — F1021 Alcohol dependence, in remission: Secondary | ICD-10-CM

## 2022-06-25 ENCOUNTER — Encounter: Payer: Self-pay | Admitting: Family Medicine

## 2022-06-25 ENCOUNTER — Other Ambulatory Visit: Payer: Self-pay

## 2022-06-25 DIAGNOSIS — I10 Essential (primary) hypertension: Secondary | ICD-10-CM

## 2022-06-25 MED ORDER — AMLODIPINE BESYLATE 5 MG PO TABS: 5.0000 mg | ORAL_TABLET | Freq: Every day | ORAL | 3 refills | Status: DC

## 2022-06-26 DIAGNOSIS — I10 Essential (primary) hypertension: Secondary | ICD-10-CM | POA: Diagnosis not present

## 2022-06-26 DIAGNOSIS — K219 Gastro-esophageal reflux disease without esophagitis: Secondary | ICD-10-CM | POA: Diagnosis not present

## 2022-06-26 DIAGNOSIS — Z880 Allergy status to penicillin: Secondary | ICD-10-CM | POA: Diagnosis not present

## 2022-06-26 DIAGNOSIS — R9431 Abnormal electrocardiogram [ECG] [EKG]: Secondary | ICD-10-CM | POA: Diagnosis not present

## 2022-06-26 DIAGNOSIS — F17290 Nicotine dependence, other tobacco product, uncomplicated: Secondary | ICD-10-CM | POA: Diagnosis not present

## 2022-06-26 DIAGNOSIS — Z882 Allergy status to sulfonamides status: Secondary | ICD-10-CM | POA: Diagnosis not present

## 2022-06-26 DIAGNOSIS — R079 Chest pain, unspecified: Secondary | ICD-10-CM | POA: Diagnosis not present

## 2022-06-26 DIAGNOSIS — F419 Anxiety disorder, unspecified: Secondary | ICD-10-CM | POA: Diagnosis not present

## 2022-06-28 ENCOUNTER — Telehealth: Payer: Self-pay

## 2022-06-28 NOTE — Telephone Encounter (Signed)
Transition Care Management Unsuccessful Follow-up Telephone Call  Date of discharge and from where:  [facility] ED 06/27/2022  Attempts:  1st Attempt  Reason for unsuccessful TCM follow-up call:  Left voice message Juanda Crumble, [place] Direct Dial [phone]

## 2022-06-30 ENCOUNTER — Other Ambulatory Visit: Payer: Self-pay | Admitting: Family Medicine

## 2022-07-01 ENCOUNTER — Telehealth: Payer: Self-pay | Admitting: Physician Assistant

## 2022-07-01 ENCOUNTER — Encounter (INDEPENDENT_AMBULATORY_CARE_PROVIDER_SITE_OTHER): Payer: Self-pay | Admitting: Family Medicine

## 2022-07-01 DIAGNOSIS — R1013 Epigastric pain: Secondary | ICD-10-CM

## 2022-07-01 DIAGNOSIS — M549 Dorsalgia, unspecified: Secondary | ICD-10-CM

## 2022-07-01 DIAGNOSIS — K219 Gastro-esophageal reflux disease without esophagitis: Secondary | ICD-10-CM

## 2022-07-01 NOTE — Progress Notes (Signed)
Because of ongoing and significant symptoms after recent ER evaluation, I feel your condition warrants further evaluation and I recommend that you be seen in a face to face visit.   NOTE: There will be NO CHARGE for this eVisit   If you are having a true medical emergency please call 911.      For an urgent face to face visit, [facility] has eight urgent care centers for your convenience:   NEW!! [place] [facility] [place] Get Driving Directions [phone] [address] [place], [place] [facility] [place] [facility] [place] Get Driving Directions [phone] [place], [place] [address]   [place] [facility] [place] [facility]) Get Driving Directions [phone] [address], [place] [address]  [place] [facility] [place]) Get Driving Directions [phone] [address] [place],  [place]  [address]  [place] [facility] [place] [facility] - at [facility] [place] Get Driving Directions  [phone] [address] [place],  [place] [address]   [place] [facility] at [facility] Get Driving Directions [phone] [place], [place], [place] [address]   [place] [facility] at [facility] Get Driving Directions  [phone] [address] [place], [place] [address]   [place] [facility] at [place] Get Driving Directions [phone] [address], [place], [place] [address]  Your MyChart E-visit questionnaire answers were reviewed by a board certified advanced clinical practitioner to complete your personal care plan based on your specific symptoms.  Thank you for using e-Visits.

## 2022-07-01 NOTE — Telephone Encounter (Signed)
Transition Care Management Unsuccessful Follow-up Telephone Call  Date of discharge and from where:  [facility] ED 06/27/2022  Attempts:  2nd Attempt  Reason for unsuccessful TCM follow-up call:  Left voice message Juanda Crumble, [place] Direct Dial [phone]

## 2022-07-02 MED ORDER — PANTOPRAZOLE SODIUM 40 MG PO TBEC: 40.0000 mg | DELAYED_RELEASE_TABLET | Freq: Every day | ORAL | 3 refills | Status: DC

## 2022-07-02 NOTE — Telephone Encounter (Signed)

## 2022-07-06 ENCOUNTER — Telehealth: Payer: Self-pay | Admitting: Nurse Practitioner

## 2022-07-06 DIAGNOSIS — L309 Dermatitis, unspecified: Secondary | ICD-10-CM

## 2022-07-06 MED ORDER — TRIAMCINOLONE ACETONIDE 0.1 % EX CREA: 1.0000 | TOPICAL_CREAM | Freq: Two times a day (BID) | CUTANEOUS | 0 refills | Status: DC

## 2022-07-06 NOTE — Progress Notes (Signed)
Yes much better!!   E-Visit for Eczema  We are sorry that you are not feeling well. Here is how we plan to help! Based on what you shared with me it looks like you have eczema (atopic dermatitis).  Although the cause of eczema is not completely understood, genetics appear to play a strong role, and people with a family history of eczema are at increased risk of developing the condition. In most people with eczema, there is a genetic abnormality in the outermost layer of the skin, called the epidermis   Most people with eczema develop their first symptoms as children, before the age of 18. Intense itching of the skin, patches of redness, small bumps, and skin flaking are common. Scratching can further inflame the skin and worsen the itching. The itchiness may be more noticeable at nighttime.  Eczema commonly affects the back of the neck, the elbow creases, and the backs of the knees. Other affected areas may include the face, wrists, and forearms. The skin may become thickened and darkened, or even scarred, from repeated scratching. Eliminating factors that aggravate your eczema symptoms can help to control the symptoms. Possible triggers may include: ? Cold or dry environments ? Sweating ? Emotional stress or anxiety ? Rapid temperature changes ? Exposure to certain chemicals or cleaning solutions, including soaps and detergents, perfumes and cosmetics, wool or synthetic fibers, dust, sand, and cigarette smoke Keeping your skin hydrated Emollients -- Emollients are creams and ointments that moisturize the skin and prevent it from drying out. The best emollients for people with eczema are thick creams (such as Eucerin, Cetaphil, and Nutraderm) or ointments (such as petroleum jelly, Aquaphor, and Vaseline), which contain little to no water. Emollients are most effective when applied immediately after bathing. Emollients can be applied twice a day or more often if needed. Lotions contain more water  than creams and ointments and are less effective for moisturizing the skin. Bathing -- It is not clear if showers or baths are better for keeping the skin hydrated. Lukewarm baths or showers can hydrate and cool the skin, temporarily relieving itching from eczema. An unscented, mild soap or non-soap cleanser (such as Cetaphil) should be used sparingly. Apply an emollient immediately after bathing or showering to prevent your skin from drying out as a result of water evaporation. Emollient bath additives (products you add to the bath water) have not been found to help relieve symptoms. Hot or long baths (more than 10 to 15 minutes) and showers should be avoided since they can dry out the skin.  Based on what you shared with me you may have eczema.   I have prescribed:Triamcinalone ointment (or cream). Apply to the effected areas twice per day.   I recommend dilute bleach baths for people with eczema. These baths help to decrease the number of bacteria on the skin that can cause infections or worsen symptoms. To prepare a bleach bath, one-fourth to one-half cup of bleach is placed in a full bathtub (about 40 gallons) of water. Bleach baths are usually taken for 5 to 10 minutes twice per week and should be followed by application of an emollient (listed above). I recommend you take Benadryl 25mg  - 50mg  every 4 hours to control the symptoms (including itching) but if they last over 24 hours it is best that you see an office based provider for follow up.  HOME CARE: Take lukewarm showers or baths Apply creams and ointments to prevent the skin from drying (Eucerin, Cetaphil, Nutraderm,  petroleum jelly, Aquaphor or Vaseline) - these products contain less water than other lotions and are more effective for moisturizing the skin Limit exposure to cold or dry environments, sweating, emotional stress and anxiety, rapid temperature changes and exposure to chemicals and cleaning products, soaps and detergents,  perfumes, cosmetics, wool and synthetic fibers, dust, sand and cigarette- factors which can aggravate eczema symptoms.  Use a hydrocortisone cream once or twice a day Take an antihistamine like Benadryl for widespread rashes that itch.  The adult dosage of Benadryl is 25-50 mg by mouth 4 times daily. Caution: This type of medication may cause sleepiness.  Do not drink alcohol, drive, or operate dangerous machinery while taking antihistamines.  Do not take these medications if you have prostate enlargement.  Read the package instructions thoroughly on all medications that you take.  GET HELP RIGHT AWAY IF: Symptoms that don't go away after treatment. Severe itching that persists. You develop a fever. Your skin begins to drain. You have a sore throat. You become short of breath.  MAKE SURE YOU   Understand these instructions. Will watch your condition. Will get help right away if you are not doing well or get worse.    Thank you for choosing an e-visit.  Your e-visit answers were reviewed by a board certified advanced clinical practitioner to complete your personal care plan. Depending upon the condition, your plan could have included both over the counter or prescription medications.  Please review your pharmacy choice. Make sure the pharmacy is open so you can pick up prescription now. If there is a problem, you may contact your provider through [employer] and have the prescription routed to another pharmacy.  Your safety is important to [place]. If you have drug allergies check your prescription carefully.   For the next 24 hours you can use MyChart to ask questions about today's visit, request a non-urgent call back, or ask for a work or school excuse. You will get an email in the next two days asking about your experience. I hope that your e-visit has been valuable and will speed your recovery.

## 2022-07-06 NOTE — Progress Notes (Signed)
I have spent 5 minutes in review of e-visit questionnaire, review and updating patient chart, medical decision making and response to patient.  ° °[place] W Lyla Jasek, NP ° °  °

## 2022-07-10 ENCOUNTER — Encounter: Payer: Self-pay | Admitting: Family Medicine

## 2022-07-10 ENCOUNTER — Other Ambulatory Visit: Payer: Self-pay

## 2022-07-22 ENCOUNTER — Encounter: Payer: Self-pay | Admitting: Family Medicine

## 2022-07-24 ENCOUNTER — Telehealth: Payer: Self-pay | Admitting: Physician Assistant

## 2022-07-24 DIAGNOSIS — M545 Low back pain, unspecified: Secondary | ICD-10-CM

## 2022-07-24 MED ORDER — CYCLOBENZAPRINE HCL 10 MG PO TABS: 10.0000 mg | ORAL_TABLET | Freq: Three times a day (TID) | ORAL | 0 refills | Status: DC | PRN

## 2022-07-24 MED ORDER — NAPROXEN 500 MG PO TABS: 500.0000 mg | ORAL_TABLET | Freq: Two times a day (BID) | ORAL | 0 refills | Status: DC

## 2022-07-24 MED ORDER — TIZANIDINE HCL 2 MG PO TABS: 2.0000 mg | ORAL_TABLET | Freq: Four times a day (QID) | ORAL | 0 refills | Status: DC | PRN

## 2022-07-24 NOTE — Progress Notes (Signed)

## 2022-07-24 NOTE — Addendum Note (Signed)
Addended by: Mar Daring on: 07/24/2022 04:43 PM   Modules accepted: Orders

## 2022-07-26 ENCOUNTER — Ambulatory Visit: Payer: Self-pay | Admitting: Family Medicine

## 2022-08-01 ENCOUNTER — Encounter: Payer: Self-pay | Admitting: Radiology

## 2022-08-02 DIAGNOSIS — Z419 Encounter for procedure for purposes other than remedying health state, unspecified: Secondary | ICD-10-CM | POA: Diagnosis not present

## 2022-08-15 ENCOUNTER — Encounter: Payer: Self-pay | Admitting: Family Medicine

## 2022-08-28 ENCOUNTER — Ambulatory Visit: Payer: Self-pay | Admitting: Family Medicine

## 2022-09-02 DIAGNOSIS — Z419 Encounter for procedure for purposes other than remedying health state, unspecified: Secondary | ICD-10-CM | POA: Diagnosis not present

## 2022-09-03 ENCOUNTER — Telehealth: Payer: Medicaid Other | Admitting: Family Medicine

## 2022-09-03 ENCOUNTER — Telehealth: Payer: Medicaid Other | Admitting: Physician Assistant

## 2022-09-03 DIAGNOSIS — U071 COVID-19: Secondary | ICD-10-CM

## 2022-09-03 MED ORDER — MOLNUPIRAVIR EUA 200MG CAPSULE: 4.0000 | ORAL_CAPSULE | Freq: Two times a day (BID) | ORAL | 0 refills | Status: AC

## 2022-09-03 NOTE — Patient Instructions (Addendum)
Kelvin Cellar Cataldi, thank you for joining Perlie Mayo, NP for today's virtual visit.  While this provider is not your primary care provider (PCP), if your PCP is located in our provider database this encounter information will be shared with them immediately following your visit.   [facility] account gives you access to today's visit and all your visits, tests, and labs performed at [facility] " click here if you don't have a [facility] account or go to [email or web address]  Consent: (Patient) [name] provided verbal consent for this virtual visit at the beginning of the encounter.  Current Medications:  Current Outpatient Medications:    molnupiravir EUA (LAGEVRIO) 200 mg CAPS capsule, Take 4 capsules (800 mg total) by mouth 2 (two) times daily for 5 days., Disp: 40 capsule, Rfl: 0   albuterol (VENTOLIN HFA) 108 (90 Base) MCG/ACT inhaler, Inhale 1-2 puffs into the lungs every 6 (six) hours as needed for wheezing or shortness of breath. (NEEDS TO BE SEEN BEFORE NEXT REFILL), Disp: 9 g, Rfl: 0   amLODipine (NORVASC) 5 MG tablet, Take 1 tablet (5 mg total) by mouth daily., Disp: 90 tablet, Rfl: 3   cetirizine (ZYRTEC) 10 MG tablet, Take 1 tablet by mouth once daily, Disp: 90 tablet, Rfl: 1   citalopram (CELEXA) 40 MG tablet, Take 40 mg by mouth daily., Disp: , Rfl:    famotidine (PEPCID) 20 MG tablet, Take 1 tablet (20 mg total) by mouth 2 (two) times daily as needed for heartburn or indigestion., Disp: 60 tablet, Rfl: 3   FLUoxetine (PROZAC) 20 MG capsule, Take 20 mg by mouth daily., Disp: , Rfl:    fluticasone (FLONASE) 50 MCG/ACT nasal spray, Place 2 sprays into both nostrils daily., Disp: 16 g, Rfl: 6   gabapentin (NEURONTIN) 300 MG capsule, Take 1 capsule (300 mg total) by mouth 2 (two) times daily. (NEEDS TO BE SEEN BEFORE NEXT REFILL), Disp: 60 capsule, Rfl: 0   hydrochlorothiazide (HYDRODIURIL) 25 MG tablet, Take 1 tablet (25 mg total) by mouth  daily as needed. For BP and swelling, Disp: 90 tablet, Rfl: 3   LORazepam (ATIVAN) 0.5 MG tablet, Take 0.5 tablets (0.25 mg total) by mouth every other day as needed for anxiety. USE SPARINGLY, Disp: 15 tablet, Rfl: 1   metoprolol succinate (TOPROL-XL) 25 MG 24 hr tablet, Take 1 tablet by mouth once daily, Disp: 90 tablet, Rfl: 0   naproxen (NAPROSYN) 500 MG tablet, Take 1 tablet (500 mg total) by mouth 2 (two) times daily with a meal., Disp: 30 tablet, Rfl: 0   ondansetron (ZOFRAN-ODT) 4 MG disintegrating tablet, Take 1 tablet (4 mg total) by mouth every 8 (eight) hours as needed for nausea or vomiting., Disp: 20 tablet, Rfl: 0   pantoprazole (PROTONIX) 40 MG tablet, Take 1 tablet (40 mg total) by mouth daily., Disp: 90 tablet, Rfl: 3   tiZANidine (ZANAFLEX) 2 MG tablet, Take 1 tablet (2 mg total) by mouth every 6 (six) hours as needed for muscle spasms., Disp: 15 tablet, Rfl: 0   triamcinolone cream (KENALOG) 0.1 %, Apply 1 Application topically 2 (two) times daily., Disp: 30 g, Rfl: 0  Current Facility-Administered Medications:    levonorgestrel (MIRENA) 20 MCG/24HR IUD 1 each, 1 each, Intrauterine, Once, Dettinger, Fransisca Kaufmann, MD   Medications ordered in this encounter:  Meds ordered this encounter  Medications   molnupiravir EUA (LAGEVRIO) 200 mg CAPS capsule    Sig: Take 4 capsules (800 mg total)  by mouth 2 (two) times daily for 5 days.    Dispense:  40 capsule    Refill:  0    Order Specific Question:   Supervising Provider    Answer:   Chase Picket [id number]     *If you need refills on other medications prior to your next appointment, please contact your pharmacy*  Follow-Up: Call back or seek an in-person evaluation if the symptoms worsen or if the condition fails to improve as anticipated.  [place] [phone]  Care Instructions:   - Continue OTC symptomatic management of choice  - Take prescribed medications as directed - Push fluids - Rest as  needed    Isolation Instructions: You are to isolate at home until you have been fever free for at least 24 hours without a fever-reducing medication, and symptoms have been steadily improving for 24 hours. At that time,  you can end isolation but need to mask for an additional 5 days.   If you must be around other household members who do not have symptoms, you need to make sure that both you and the family members are masking consistently with a high-quality mask.  If you note any worsening of symptoms despite treatment, please seek an in-person evaluation ASAP. If you note any significant shortness of breath or any chest pain, please seek ER evaluation. Please do not delay care!   COVID-19: What to Do if You Are Sick If you test positive and are an older adult or someone who is at high risk of getting very sick from COVID-19, treatment may be available. Contact a healthcare provider right away after a positive test to determine if you are eligible, even if your symptoms are mild right now. You can also visit a Test to Treat location and, if eligible, receive a prescription from a provider. Don't delay: Treatment must be started within the first few days to be effective. If you have a fever, cough, or other symptoms, you might have COVID-19. Most people have mild illness and are able to recover at home. If you are sick: Keep track of your symptoms. If you have an emergency warning sign (including trouble breathing), call 911. Steps to help prevent the spread of COVID-19 if you are sick If you are sick with COVID-19 or think you might have COVID-19, follow the steps below to care for yourself and to help protect other people in your home and community. Stay home except to get medical care Stay home. Most people with COVID-19 have mild illness and can recover at home without medical care. Do not leave your home, except to get medical care. Do not visit public areas and do not go to places where  you are unable to wear a mask. Take care of yourself. Get rest and stay hydrated. Take over-the-counter medicines, such as acetaminophen, to help you feel better. Stay in touch with your doctor. Call before you get medical care. Be sure to get care if you have trouble breathing, or have any other emergency warning signs, or if you think it is an emergency. Avoid public transportation, ride-sharing, or taxis if possible. Get tested If you have symptoms of COVID-19, get tested. While waiting for test results, stay away from others, including staying apart from those living in your household. Get tested as soon as possible after your symptoms start. Treatments may be available for people with COVID-19 who are at risk for becoming very sick. Don't delay: Treatment must be  started early to be effective--some treatments must begin within 5 days of your first symptoms. Contact your healthcare provider right away if your test result is positive to determine if you are eligible. Self-tests are one of several options for testing for the virus that causes COVID-19 and may be more convenient than laboratory-based tests and point-of-care tests. Ask your healthcare provider or your local health department if you need help interpreting your test results. You can visit your state, tribal, local, and territorial health department's website to look for the latest local information on testing sites. Separate yourself from other people As much as possible, stay in a specific room and away from other people and pets in your home. If possible, you should use a separate bathroom. If you need to be around other people or animals in or outside of the home, wear a well-fitting mask. Tell your close contacts that they may have been exposed to COVID-19. An infected person can spread COVID-19 starting 48 hours (or 2 days) before the person has any symptoms or tests positive. By letting your close contacts know they may have been  exposed to COVID-19, you are helping to protect everyone. See COVID-19 and Animals if you have questions about pets. If you are diagnosed with COVID-19, someone from the health department may call you. Answer the call to slow the spread. Monitor your symptoms Symptoms of COVID-19 include fever, cough, or other symptoms. Follow care instructions from your healthcare provider and local health department. Your local health authorities may give instructions on checking your symptoms and reporting information. When to seek emergency medical attention Look for emergency warning signs* for COVID-19. If someone is showing any of these signs, seek emergency medical care immediately: Trouble breathing Persistent pain or pressure in the chest New confusion Inability to wake or stay awake Pale, gray, or blue-colored skin, lips, or nail beds, depending on skin tone *This list is not all possible symptoms. Please call your medical provider for any other symptoms that are severe or concerning to you. Call 911 or call ahead to your local emergency facility: Notify the operator that you are seeking care for someone who has or may have COVID-19. Call ahead before visiting your doctor Call ahead. Many medical visits for routine care are being postponed or done by phone or telemedicine. If you have a medical appointment that cannot be postponed, call your doctor's office, and tell them you have or may have COVID-19. This will help the office protect themselves and other patients. If you are sick, wear a well-fitting mask You should wear a mask if you must be around other people or animals, including pets (even at home). Wear a mask with the best fit, protection, and comfort for you. You don't need to wear the mask if you are alone. If you can't put on a mask (because of trouble breathing, for example), cover your coughs and sneezes in some other way. Try to stay at least 6 feet away from other people. This will  help protect the people around you. Masks should not be placed on young children under age 38 years, anyone who has trouble breathing, or anyone who is not able to remove the mask without help. Cover your coughs and sneezes Cover your mouth and nose with a tissue when you cough or sneeze. Throw away used tissues in a lined trash can. Immediately wash your hands with soap and water for at least 20 seconds. If soap and water are not available, clean  your hands with an alcohol-based hand sanitizer that contains at least 60% alcohol. Clean your hands often Wash your hands often with soap and water for at least 20 seconds. This is especially important after blowing your nose, coughing, or sneezing; going to the bathroom; and before eating or preparing food. Use hand sanitizer if soap and water are not available. Use an alcohol-based hand sanitizer with at least 60% alcohol, covering all surfaces of your hands and rubbing them together until they feel dry. Soap and water are the best option, especially if hands are visibly dirty. Avoid touching your eyes, nose, and mouth with unwashed hands. Handwashing Tips Avoid sharing personal household items Do not share dishes, drinking glasses, cups, eating utensils, towels, or bedding with other people in your home. Wash these items thoroughly after using them with soap and water or put in the dishwasher. Clean surfaces in your home regularly Clean and disinfect high-touch surfaces (for example, doorknobs, tables, handles, light switches, and countertops) in your "sick room" and bathroom. In shared spaces, you should clean and disinfect surfaces and items after each use by the person who is ill. If you are sick and cannot clean, a caregiver or other person should only clean and disinfect the area around you (such as your bedroom and bathroom) on an as needed basis. Your caregiver/other person should wait as long as possible (at least several hours) and wear a mask  before entering, cleaning, and disinfecting shared spaces that you use. Clean and disinfect areas that may have blood, stool, or body fluids on them. Use household cleaners and disinfectants. Clean visible dirty surfaces with household cleaners containing soap or detergent. Then, use a household disinfectant. Use a product from [employer] List N: Disinfectants for Coronavirus ([id number]). Be sure to follow the instructions on the label to ensure safe and effective use of the product. Many products recommend keeping the surface wet with a disinfectant for a certain period of time (look at "contact time" on the product label). You may also need to wear personal protective equipment, such as gloves, depending on the directions on the product label. Immediately after disinfecting, wash your hands with soap and water for 20 seconds. For completed guidance on cleaning and disinfecting your home, visit Complete Disinfection Guidance. Take steps to improve ventilation at home Improve ventilation (air flow) at home to help prevent from spreading COVID-19 to other people in your household. Clear out COVID-19 virus particles in the air by opening windows, using air filters, and turning on fans in your home. Use this interactive tool to learn how to improve air flow in your home. When you can be around others after being sick with COVID-19 Deciding when you can be around others is different for different situations. Find out when you can safely end home isolation. For any additional questions about your care, contact your healthcare provider or state or local health department. 08/22/2020 Content source: [facility] ([facility]), Division of Viral Diseases This information is not intended to replace advice given to you by your health care provider. Make sure you discuss any questions you have with your health care provider. Document Revised: 10/05/2020 Document Reviewed:  10/05/2020 Elsevier Patient Education  2022 [employer].     If you have been instructed to have an in-person evaluation today at a local Urgent Care facility, please use the link below. It will take you to a list of all of our available  Urgent Cares, including address,  phone number and hours of operation. Please do not delay care.  [facility]  If you or a family member do not have a primary care provider, use the link below to schedule a visit and establish care. When you choose a [facility] primary care physician or advanced practice provider, you gain a long-term partner in health. Find a Primary Care Provider  Learn more about [facility]'s in-office and virtual care options: [facility]

## 2022-09-03 NOTE — Progress Notes (Signed)
   Thank you for the details you included in the comment boxes. Those details are very helpful in determining the best course of treatment for you and help [place] to provide the best care.Because you are COVID positive and with windedness, we recommend that you convert this visit to a video visit in order for the provider to better assess what is going on.  The provider will be able to give you a more accurate diagnosis and treatment plan if we can more freely discuss your symptoms and with the addition of a virtual examination.   If you convert to a video visit, we will bill your insurance (similar to an office visit) and you will not be charged for this e-Visit. You will be able to stay at home and speak with the first available [facility] advanced practice provider. The link to do a video visit is in the drop down Menu tab of your Welcome screen in [place].

## 2022-09-03 NOTE — Progress Notes (Signed)
Virtual Visit Consent   [name], you are scheduled for a virtual visit with a [facility] provider today. Just as with appointments in the office, your consent must be obtained to participate. Your consent will be active for this visit and any virtual visit you may have with one of our providers in the next 365 days. If you have a MyChart account, a copy of this consent can be sent to you electronically.  As this is a virtual visit, video technology does not allow for your provider to perform a traditional examination. This may limit your provider's ability to fully assess your condition. If your provider identifies any concerns that need to be evaluated in person or the need to arrange testing (such as labs, EKG, etc.), we will make arrangements to do so. Although advances in technology are sophisticated, we cannot ensure that it will always work on either your end or our end. If the connection with a video visit is poor, the visit may have to be switched to a telephone visit. With either a video or telephone visit, we are not always able to ensure that we have a secure connection.  By engaging in this virtual visit, you consent to the provision of healthcare and authorize for your insurance to be billed (if applicable) for the services provided during this visit. Depending on your insurance coverage, you may receive a charge related to this service.  I need to obtain your verbal consent now. Are you willing to proceed with your visit today? [name] has provided verbal consent on 09/03/2022 for a virtual visit (video or telephone). Caitlin Mayo, [name]  Date: 09/03/2022 9:44 AM  Virtual Visit via Video Note   I, [name], connected with  [name]  ([id number], 18-Jun-1980) on 09/03/22 at  9:45 AM EDT by a video-enabled telemedicine application and verified that I am speaking with the correct person using two identifiers.  Location: Patient: Virtual Visit Location Patient:  Home Provider: Virtual Visit Location Provider: Home Office   I discussed the limitations of evaluation and management by telemedicine and the availability of in person appointments. The patient expressed understanding and agreed to proceed.    History of Present Illness: [name] is a 42 y.o. who identifies as a female who was assigned female at birth, and is being seen today for covid 19  Onset was 3/30- 4 days ago, started with scratchy throat and sneezing. Associated symptoms are sneezing, coughing, achy ears, headache and congestion, shortness of breath at times- stuffiness Modifying factors are corticin and nyquil Denies chest pain, fevers, chills  Exposure to sick contacts- unknown, but works in [place] test: 09/01/2022 + test Vaccines: no vaccines or boosters  First time with covid   Problems:  Patient Active Problem List   Diagnosis Date Noted   History of pre-eclampsia 08/02/2021   History of gestational hypertension 08/02/2021   History of gestational diabetes 08/02/2021   Ectopic pregnancy 12/29/2019   Mixed hyperlipidemia 10/28/2018   Essential hypertension 05/09/2017   Family history of early CAD 05/09/2017   Atypical chest pain 05/09/2017   Abnormal menstrual periods 05/09/2017   Anxiety state 05/09/2017    Allergies:  Allergies  Allergen Reactions   Penicillins     Hives and itching   Sulfa Antibiotics     Hives and itching    Azithromycin Anxiety and Palpitations    Chest pain and dizziness    Medications:  Current Outpatient  Medications:    albuterol (VENTOLIN HFA) 108 (90 Base) MCG/ACT inhaler, Inhale 1-2 puffs into the lungs every 6 (six) hours as needed for wheezing or shortness of breath. (NEEDS TO BE SEEN BEFORE NEXT REFILL), Disp: 9 g, Rfl: 0   amLODipine (NORVASC) 5 MG tablet, Take 1 tablet (5 mg total) by mouth daily., Disp: 90 tablet, Rfl: 3   cetirizine (ZYRTEC) 10 MG tablet, Take 1 tablet by mouth once daily, Disp: 90  tablet, Rfl: 1   citalopram (CELEXA) 40 MG tablet, Take 40 mg by mouth daily., Disp: , Rfl:    famotidine (PEPCID) 20 MG tablet, Take 1 tablet (20 mg total) by mouth 2 (two) times daily as needed for heartburn or indigestion., Disp: 60 tablet, Rfl: 3   FLUoxetine (PROZAC) 20 MG capsule, Take 20 mg by mouth daily., Disp: , Rfl:    fluticasone (FLONASE) 50 MCG/ACT nasal spray, Place 2 sprays into both nostrils daily., Disp: 16 g, Rfl: 6   gabapentin (NEURONTIN) 300 MG capsule, Take 1 capsule (300 mg total) by mouth 2 (two) times daily. (NEEDS TO BE SEEN BEFORE NEXT REFILL), Disp: 60 capsule, Rfl: 0   hydrochlorothiazide (HYDRODIURIL) 25 MG tablet, Take 1 tablet (25 mg total) by mouth daily as needed. For BP and swelling, Disp: 90 tablet, Rfl: 3   LORazepam (ATIVAN) 0.5 MG tablet, Take 0.5 tablets (0.25 mg total) by mouth every other day as needed for anxiety. USE SPARINGLY, Disp: 15 tablet, Rfl: 1   metoprolol succinate (TOPROL-XL) 25 MG 24 hr tablet, Take 1 tablet by mouth once daily, Disp: 90 tablet, Rfl: 0   naproxen (NAPROSYN) 500 MG tablet, Take 1 tablet (500 mg total) by mouth 2 (two) times daily with a meal., Disp: 30 tablet, Rfl: 0   ondansetron (ZOFRAN-ODT) 4 MG disintegrating tablet, Take 1 tablet (4 mg total) by mouth every 8 (eight) hours as needed for nausea or vomiting., Disp: 20 tablet, Rfl: 0   pantoprazole (PROTONIX) 40 MG tablet, Take 1 tablet (40 mg total) by mouth daily., Disp: 90 tablet, Rfl: 3   tiZANidine (ZANAFLEX) 2 MG tablet, Take 1 tablet (2 mg total) by mouth every 6 (six) hours as needed for muscle spasms., Disp: 15 tablet, Rfl: 0   triamcinolone cream (KENALOG) 0.1 %, Apply 1 Application topically 2 (two) times daily., Disp: 30 g, Rfl: 0  Current Facility-Administered Medications:    levonorgestrel (MIRENA) 20 MCG/24HR IUD 1 each, 1 each, Intrauterine, Once, Dettinger, Fransisca Kaufmann, MD  Observations/Objective: Patient is well-developed, well-nourished in no acute distress.   Resting comfortably  at home.  Head is normocephalic, atraumatic.  No labored breathing.  Speech is clear and coherent with logical content.  Patient is alert and oriented at baseline.    Assessment and Plan:  1. COVID-19  - molnupiravir EUA (LAGEVRIO) 200 mg CAPS capsule; Take 4 capsules (800 mg total) by mouth 2 (two) times daily for 5 days.  Dispense: 40 capsule; Refill: 0   - Continue OTC symptomatic management of choice - Info for COVID sent on AVS as well - Take prescribed medications as directed - Push fluids - Rest as needed - Discussed return precautions and when to seek in-person evaluation, sent via AVS as well  Reviewed side effects, risks and benefits of medication.    Patient acknowledged agreement and understanding of the plan.   Past Medical, Surgical, Social History, Allergies, and Medications have been Reviewed.    Follow Up Instructions: I discussed the assessment and treatment plan  with the patient. The patient was provided an opportunity to ask questions and all were answered. The patient agreed with the plan and demonstrated an understanding of the instructions.  A copy of instructions were sent to the patient via MyChart unless otherwise noted below.    The patient was advised to call back or seek an in-person evaluation if the symptoms worsen or if the condition fails to improve as anticipated.  Time:  I spent 10 minutes with the patient via telehealth technology discussing the above problems/concerns.    Caitlin Mayo, [name]

## 2022-09-16 ENCOUNTER — Encounter: Payer: Self-pay | Admitting: Family Medicine

## 2022-09-16 ENCOUNTER — Ambulatory Visit: Payer: Medicaid Other | Admitting: Family Medicine

## 2022-09-16 VITALS — BP 114/82 | HR 79 | Temp 98.6°F | Ht 63.0 in | Wt 241.0 lb

## 2022-09-16 DIAGNOSIS — R5382 Chronic fatigue, unspecified: Secondary | ICD-10-CM

## 2022-09-16 DIAGNOSIS — K219 Gastro-esophageal reflux disease without esophagitis: Secondary | ICD-10-CM

## 2022-09-16 DIAGNOSIS — L301 Dyshidrosis [pompholyx]: Secondary | ICD-10-CM

## 2022-09-16 DIAGNOSIS — I10 Essential (primary) hypertension: Secondary | ICD-10-CM | POA: Diagnosis not present

## 2022-09-16 DIAGNOSIS — F1021 Alcohol dependence, in remission: Secondary | ICD-10-CM | POA: Diagnosis not present

## 2022-09-16 DIAGNOSIS — R635 Abnormal weight gain: Secondary | ICD-10-CM

## 2022-09-16 LAB — BAYER DCA HB A1C WAIVED: HB A1C (BAYER DCA - WAIVED): 5.9 % — ABNORMAL HIGH (ref 4.8–5.6)

## 2022-09-16 MED ORDER — TRIAMCINOLONE ACETONIDE 0.1 % EX CREA: 1.0000 | TOPICAL_CREAM | Freq: Two times a day (BID) | CUTANEOUS | 0 refills | Status: AC

## 2022-09-16 MED ORDER — PANTOPRAZOLE SODIUM 40 MG PO TBEC: 40.0000 mg | DELAYED_RELEASE_TABLET | Freq: Every day | ORAL | 3 refills | Status: DC

## 2022-09-16 MED ORDER — METOPROLOL SUCCINATE ER 25 MG PO TB24: 25.0000 mg | ORAL_TABLET | Freq: Every day | ORAL | 3 refills | Status: DC

## 2022-09-16 MED ORDER — ALBUTEROL SULFATE HFA 108 (90 BASE) MCG/ACT IN AERS: 1.0000 | INHALATION_SPRAY | Freq: Four times a day (QID) | RESPIRATORY_TRACT | 0 refills | Status: DC | PRN

## 2022-09-16 MED ORDER — GABAPENTIN 300 MG PO CAPS: 300.0000 mg | ORAL_CAPSULE | Freq: Two times a day (BID) | ORAL | 3 refills | Status: DC

## 2022-09-16 MED ORDER — AMLODIPINE BESYLATE 5 MG PO TABS: 5.0000 mg | ORAL_TABLET | Freq: Every day | ORAL | 3 refills | Status: DC

## 2022-09-16 NOTE — Progress Notes (Signed)
Subjective: CC: Follow-up hypertension PCP: Caitlin Ip, DO [id number] [name] is a 42 y.o. female presenting to clinic today for:  1.  Hypertension associated with morbid obesity Patient is compliant with Norvasc 5 mg daily and metoprolol.  She has not needed to use the hydrochlorothiazide all for swelling or elevations in blood pressure but would like to have one on hand just in case.  She reports some weight gain.  She is not exercising nor is she restricting meals.  She feels hungry all the time.  She does report feeling short of breath with exertion sometimes.  Needs refill on her inhaler  2.  Anxiety, depression, history of alcoholism Patient reports use of Lexapro 10 mg daily.  This is recently de-escalated from 20 mg because she has been doing well.  She had intolerance to both Prozac and Zoloft secondary to hyperalgesia.  She has Ativan on hand if needed but has not needed to use it in several months.  Continues to use gabapentin for maintenance from alcoholism and has been in remission for over 3 years now  3.  Eczema Patient was diagnosed with eczema earlier this year.  She reports that this was located in the interdigital spaces.  It appeared to be fluid-filled blisters.  They are resolved now but she would like to have triamcinolone on hand   ROS: Per HPI  Allergies  Allergen Reactions   Penicillins     Hives and itching   Sulfa Antibiotics     Hives and itching    Azithromycin Anxiety and Palpitations    Chest pain and dizziness    Past Medical History:  Diagnosis Date   Anxiety    Arthritis    Depression    Ectopic pregnancy 12/29/2019   Treated with methotrexate   Endometriosis    Headache    Hx of tubal ligation 2008   Hypertension    Infection    UTI    Current Outpatient Medications:    amLODipine (NORVASC) 5 MG tablet, Take 1 tablet (5 mg total) by mouth daily., Disp: 90 tablet, Rfl: 3   cetirizine (ZYRTEC) 10 MG tablet, Take 1 tablet by  mouth once daily, Disp: 90 tablet, Rfl: 1   gabapentin (NEURONTIN) 300 MG capsule, Take 1 capsule (300 mg total) by mouth 2 (two) times daily. (NEEDS TO BE SEEN BEFORE NEXT REFILL), Disp: 60 capsule, Rfl: 0   LORazepam (ATIVAN) 0.5 MG tablet, Take 0.5 tablets (0.25 mg total) by mouth every other day as needed for anxiety. USE SPARINGLY, Disp: 15 tablet, Rfl: 1   metoprolol succinate (TOPROL-XL) 25 MG 24 hr tablet, Take 1 tablet by mouth once daily, Disp: 90 tablet, Rfl: 0   naproxen (NAPROSYN) 500 MG tablet, Take 1 tablet (500 mg total) by mouth 2 (two) times daily with a meal., Disp: 30 tablet, Rfl: 0   pantoprazole (PROTONIX) 40 MG tablet, Take 1 tablet (40 mg total) by mouth daily., Disp: 90 tablet, Rfl: 3   triamcinolone cream (KENALOG) 0.1 %, Apply 1 Application topically 2 (two) times daily., Disp: 30 g, Rfl: 0   famotidine (PEPCID) 20 MG tablet, Take 1 tablet (20 mg total) by mouth 2 (two) times daily as needed for heartburn or indigestion. (Patient not taking: Reported on 09/16/2022), Disp: 60 tablet, Rfl: 3  Current Facility-Administered Medications:    levonorgestrel (MIRENA) 20 MCG/24HR IUD 1 each, 1 each, Intrauterine, Once, [place]ting[place], [name], [name] Social History   Socioeconomic History   Marital status:  Divorced    Spouse name: Not on file   Number of children: Not on file   Years of education: Not on file   Highest education level: Some college, no degree  Occupational History   Occupation: CMA  Tobacco Use   Smoking status: Former    Packs/day: 1.00    Years: 5.00    Additional pack years: 0.00    Total pack years: 5.00    Types: Cigarettes    Start date: 05/29/2006    Quit date: 01/07/2020    Years since quitting: 2.6   Smokeless tobacco: Never  Vaping Use   Vaping Use: Some days  Substance and Sexual Activity   Alcohol use: Yes    Comment: occasional   Drug use: No   Sexual activity: Yes    Birth control/protection: Injection    Comment: BTL  Other Topics  Concern   Not on file  Social History Narrative   Not on file   Social Determinants of Health   Financial Resource Strain: Low Risk  (09/15/2022)   Overall Financial Resource Strain (CARDIA)    Difficulty of Paying Living Expenses: Not hard at all  Food Insecurity: No Food Insecurity (09/15/2022)   Hunger Vital Sign    Worried About Running Out of Food in the Last Year: Never true    Ran Out of Food in the Last Year: Never true  Transportation Needs: No Transportation Needs (09/15/2022)   PRAPARE - Administrator, Civil Service (Medical): No    Lack of Transportation (Non-Medical): No  Physical Activity: Unknown (09/15/2022)   Exercise Vital Sign    Days of Exercise per Week: 0 days    Minutes of Exercise per Session: Not on file  Stress: Stress Concern Present (09/15/2022)   [employer] of Occupational Health - Occupational Stress Questionnaire    Feeling of Stress : To some extent  Social Connections: Moderately Integrated (09/15/2022)   Social Connection and Isolation Panel [NHANES]    Frequency of Communication with Friends and Family: More than three times a week    Frequency of Social Gatherings with Friends and Family: More than three times a week    Attends Religious Services: More than 4 times per year    Active Member of [employer] or Organizations: No    Attends Engineer, structural: Not on file    Marital Status: Married  Catering manager Violence: Not on file   Family History  Problem Relation Age of Onset   Depression Mother    Stroke Mother    Diabetes Father    Hypertension Father    Stroke Father    Heart attack Father 30   Hypertension Sister    Hypertension Brother    Heart attack Brother 66       x3 MI (twin bro)   CAD Brother    Asthma Daughter    Breast cancer Paternal Grandmother 41   Hypertension Brother    Healthy Daughter     Objective: Office vital signs reviewed. BP 114/82   Pulse 79   Temp 98.6 F (37 C)   Ht 5'  3" (1.6 Caitlin)   Wt 241 lb (109.3 kg)   SpO2 95%   BMI 42.69 kg/Caitlin   Physical Examination:  General: Awake, alert, well nourished, obese.  No acute distress HEENT: Sclera white.  No exophthalmos.  No goiter.  No palpable thyromegaly or lymphadenopathy in the neck or supraclavicular areas. Cardio: regular rate and rhythm, S1S2  heard, no murmurs appreciated Pulm: clear to auscultation bilaterally, no wheezes, rhonchi or rales; normal work of breathing on room air Psych: Mood stable, speech normal, affect appropriate.  Very pleasant, interactive  Assessment/ Plan: 42 y.o. female   Essential hypertension - Plan: CMP14+EGFR, amLODipine (NORVASC) 5 MG tablet  Weight gain - Plan: Bayer DCA Hb A1c Waived, CMP14+EGFR  Morbid obesity - Plan: Bayer DCA Hb A1c Waived, CMP14+EGFR, TSH, T4, Free, VITAMIN D 25 Hydroxy (Vit-D Deficiency, Fractures)  Chronic fatigue - Plan: Bayer DCA Hb A1c Waived, CMP14+EGFR, TSH, T4, Free, CBC, Vitamin B12, Iron, TIBC and Ferritin Panel  Alcoholism in recovery - Plan: gabapentin (NEURONTIN) 300 MG capsule  Gastroesophageal reflux disease without esophagitis - Plan: pantoprazole (PROTONIX) 40 MG tablet  Dyshidrotic eczema - Plan: triamcinolone cream (KENALOG) 0.1 %  Blood pressure under excellent control.  Amlodipine renewed.  Nonfasting labs collected today given weight gain.  Check A1c, metabolic labs including thyroid, vitamin D.  She has chronic fatigue and difficult to tell if this is due to an underlying, yet to be identified thyroid disorder or if this is from morbid obesity.  She has had sleep study in the past  Continues to be in recovery from alcoholism.  Gabapentin renewed for maintenance  PPI renewed for GERD.  This is controlled  Dyshidrotic eczema under good control with as needed use of triamcinolone.  Renewal sent to pharmacy  No orders of the defined types were placed in this encounter.  No orders of the defined types were placed in this  encounter.    Caitlin Ip, DO [facility] [phone]

## 2022-09-17 ENCOUNTER — Other Ambulatory Visit: Payer: Self-pay | Admitting: Family Medicine

## 2022-09-17 DIAGNOSIS — E559 Vitamin D deficiency, unspecified: Secondary | ICD-10-CM

## 2022-09-17 LAB — CMP14+EGFR
ALT: 17 IU/L (ref 0–32)
AST: 18 IU/L (ref 0–40)
Albumin/Globulin Ratio: 2.1 (ref 1.2–2.2)
Albumin: 4.2 g/dL (ref 3.9–4.9)
Alkaline Phosphatase: 81 IU/L (ref 44–121)
BUN/Creatinine Ratio: 12 (ref 9–23)
BUN: 9 mg/dL (ref 6–20)
Bilirubin Total: 0.3 mg/dL (ref 0.0–1.2)
CO2: 23 mmol/L (ref 20–29)
Calcium: 9.4 mg/dL (ref 8.7–10.2)
Chloride: 101 mmol/L (ref 96–106)
Creatinine, Ser: 0.76 mg/dL (ref 0.57–1.00)
Globulin, Total: 2 g/dL (ref 1.5–4.5)
Glucose: 104 mg/dL — ABNORMAL HIGH (ref 70–99)
Potassium: 4.3 mmol/L (ref 3.5–5.2)
Sodium: 136 mmol/L (ref 134–144)
Total Protein: 6.2 g/dL (ref 6.0–8.5)
eGFR: 102 mL/min/{1.73_m2} (ref >59)

## 2022-09-17 LAB — IRON,TIBC AND FERRITIN PANEL
Ferritin: 68 ng/mL (ref 15–150)
Iron Saturation: 37 % (ref 15–55)
Iron: 109 ug/dL (ref 27–159)
Total Iron Binding Capacity: 294 ug/dL (ref 250–450)
UIBC: 185 ug/dL (ref 131–425)

## 2022-09-17 LAB — T4, FREE: Free T4: 0.92 ng/dL (ref 0.82–1.77)

## 2022-09-17 LAB — CBC
Hematocrit: 40.7 % (ref 34.0–46.6)
Hemoglobin: 13.8 g/dL (ref 11.1–15.9)
MCH: 29.1 pg (ref 26.6–33.0)
MCHC: 33.9 g/dL (ref 31.5–35.7)
MCV: 86 fL (ref 79–97)
Platelets: 277 10*3/uL (ref 150–450)
RBC: 4.75 x10E6/uL (ref 3.77–5.28)
RDW: 13.1 % (ref 11.7–15.4)
WBC: 9.2 10*3/uL (ref 3.4–10.8)

## 2022-09-17 LAB — VITAMIN D 25 HYDROXY (VIT D DEFICIENCY, FRACTURES): Vit D, 25-Hydroxy: 18.1 ng/mL — ABNORMAL LOW (ref 30.0–100.0)

## 2022-09-17 LAB — TSH: TSH: 1.18 u[IU]/mL (ref 0.450–4.500)

## 2022-09-17 LAB — VITAMIN B12: Vitamin B-12: 632 pg/mL (ref 232–1245)

## 2022-09-17 MED ORDER — VITAMIN D (ERGOCALCIFEROL) 1.25 MG (50000 UNIT) PO CAPS: 50000.0000 [IU] | ORAL_CAPSULE | ORAL | 0 refills | Status: DC

## 2022-10-02 DIAGNOSIS — Z419 Encounter for procedure for purposes other than remedying health state, unspecified: Secondary | ICD-10-CM | POA: Diagnosis not present

## 2022-10-04 ENCOUNTER — Telehealth: Payer: Medicaid Other | Admitting: Nurse Practitioner

## 2022-10-04 DIAGNOSIS — M533 Sacrococcygeal disorders, not elsewhere classified: Secondary | ICD-10-CM

## 2022-10-04 NOTE — Progress Notes (Signed)
[name],  Because this is a recurrent and chronic condition, I feel your condition warrants further evaluation and I recommend that you be seen for a face to face visit.  You may need imaging to re evaluate the area of injury- Please contact your primary care physician practice to be seen. Many offices offer virtual options to be seen via video if you are not comfortable going in person to a medical facility at this time.  NOTE: You will NOT be charged for this eVisit.  If you do not have a PCP, [facility] offers a free physician referral service available at [phone]. Our trained staff has the experience, knowledge and resources to put you in touch with a physician who is right for you.    If you are having a true medical emergency please call 911.   Your e-visit answers were reviewed by a board certified advanced clinical practitioner to complete your personal care plan.  Thank you for using e-Visits.

## 2022-10-06 ENCOUNTER — Other Ambulatory Visit: Payer: Self-pay | Admitting: Family Medicine

## 2022-10-09 ENCOUNTER — Ambulatory Visit: Payer: Medicaid Other | Admitting: Family Medicine

## 2022-10-17 ENCOUNTER — Encounter: Payer: Self-pay | Admitting: Family Medicine

## 2022-10-24 ENCOUNTER — Telehealth: Payer: Medicaid Other | Admitting: Physician Assistant

## 2022-10-24 DIAGNOSIS — R42 Dizziness and giddiness: Secondary | ICD-10-CM

## 2022-10-24 DIAGNOSIS — M549 Dorsalgia, unspecified: Secondary | ICD-10-CM

## 2022-10-24 NOTE — Progress Notes (Signed)
Because of dizziness and lightheadedness along with the pain, I feel your condition warrants further evaluation and I recommend that you be seen in a face to face visit.   NOTE: There will be NO CHARGE for this eVisit   If you are having a true medical emergency please call 911.      For an urgent face to face visit, [place] has eight urgent care centers for your convenience:   NEW!! [facility] Health Urgent Care Center at [facility] Get Driving Directions [phone] [address] [place], [address]    [facility] Health Urgent Care Center at [facility] Get Driving Directions [phone] [address] [place], [place] [address]   [facility] Center [facility]) Get Driving Directions [phone] [address] [place], [place] [address]  [facility] Health Urgent Care Center [facility]) Get Driving Directions [phone] [address] [place],  [place]  [address]  [facility] Health Urgent Care Center [facility] - at [employer] [facility]  [phone] [place],  [place] [address]   [facility] Health Urgent Care at [facility] Get Driving Directions [phone] [address] [place], [place] [address]   [facility] Health Urgent Care at [facility] Get Driving Directions  [phone] [address] [place], [place] [address]   [facility] Health Urgent Care at [facility] Directions [phone] [address] [place], [place] [address]  Your MyChart E-visit questionnaire answers were reviewed by a board certified advanced clinical practitioner to complete your personal care plan based on your specific symptoms.  Thank you for using e-Visits.

## 2022-10-28 ENCOUNTER — Encounter: Payer: Self-pay | Admitting: Family Medicine

## 2022-11-02 DIAGNOSIS — Z419 Encounter for procedure for purposes other than remedying health state, unspecified: Secondary | ICD-10-CM | POA: Diagnosis not present

## 2022-11-03 ENCOUNTER — Telehealth: Payer: 59 | Admitting: Physician Assistant

## 2022-11-03 DIAGNOSIS — B9689 Other specified bacterial agents as the cause of diseases classified elsewhere: Secondary | ICD-10-CM

## 2022-11-03 DIAGNOSIS — N760 Acute vaginitis: Secondary | ICD-10-CM | POA: Diagnosis not present

## 2022-11-03 MED ORDER — METRONIDAZOLE 500 MG PO TABS: 500.0000 mg | ORAL_TABLET | Freq: Two times a day (BID) | ORAL | 0 refills | Status: AC

## 2022-11-03 NOTE — Progress Notes (Signed)
E-Visit for Vaginal Symptoms  We are sorry that you are not feeling well. Here is how we plan to help! Based on what you shared with me it looks like you: May have a vaginosis due to bacteria  Vaginosis is an inflammation of the vagina that can result in discharge, itching and pain. The cause is usually a change in the normal balance of vaginal bacteria or an infection. Vaginosis can also result from reduced estrogen levels after menopause.  The most common causes of vaginosis are:   Bacterial vaginosis which results from an overgrowth of one on several organisms that are normally present in your vagina.   Yeast infections which are caused by a naturally occurring fungus called candida.   Vaginal atrophy (atrophic vaginosis) which results from the thinning of the vagina from reduced estrogen levels after menopause.   Trichomoniasis which is caused by a parasite and is commonly transmitted by sexual intercourse.  Factors that increase your risk of developing vaginosis include: Medications, such as antibiotics and steroids Uncontrolled diabetes Use of hygiene products such as bubble bath, vaginal spray or vaginal deodorant Douching Wearing damp or tight-fitting clothing Using an intrauterine device (IUD) for birth control Hormonal changes, such as those associated with pregnancy, birth control pills or menopause Sexual activity Having a sexually transmitted infection  Your treatment plan is Metronidazole or Flagyl 500mg twice a day for 7 days.  I have electronically sent this prescription into the pharmacy that you have chosen.  Be sure to take all of the medication as directed. Stop taking any medication if you develop a rash, tongue swelling or shortness of breath. Mothers who are breast feeding should consider pumping and discarding their breast milk while on these antibiotics. However, there is no consensus that infant exposure at these doses would be harmful.  Remember that  medication creams can weaken latex condoms. .   HOME CARE:  Good hygiene may prevent some types of vaginosis from recurring and may relieve some symptoms:  Avoid baths, hot tubs and whirlpool spas. Rinse soap from your outer genital area after a shower, and dry the area well to prevent irritation. Don't use scented or harsh soaps, such as those with deodorant or antibacterial action. Avoid irritants. These include scented tampons and pads. Wipe from front to back after using the toilet. Doing so avoids spreading fecal bacteria to your vagina.  Other things that may help prevent vaginosis include:  Don't douche. Your vagina doesn't require cleansing other than normal bathing. Repetitive douching disrupts the normal organisms that reside in the vagina and can actually increase your risk of vaginal infection. Douching won't clear up a vaginal infection. Use a latex condom. Both female and female latex condoms may help you avoid infections spread by sexual contact. Wear cotton underwear. Also wear pantyhose with a cotton crotch. If you feel comfortable without it, skip wearing underwear to bed. Yeast thrives in moist environments Your symptoms should improve in the next day or two.  GET HELP RIGHT AWAY IF:  You have pain in your lower abdomen ( pelvic area or over your ovaries) You develop nausea or vomiting You develop a fever Your discharge changes or worsens You have persistent pain with intercourse You develop shortness of breath, a rapid pulse, or you faint.  These symptoms could be signs of problems or infections that need to be evaluated by a medical provider now.  MAKE SURE YOU   Understand these instructions. Will watch your condition. Will get help right   away if you are not doing well or get worse.  Thank you for choosing an e-visit.  Your e-visit answers were reviewed by a board certified advanced clinical practitioner to complete your personal care plan. Depending upon the  condition, your plan could have included both over the counter or prescription medications.  Please review your pharmacy choice. Make sure the pharmacy is open so you can pick up prescription now. If there is a problem, you may contact your provider through MyChart messaging and have the prescription routed to another pharmacy.  Your safety is important to us. If you have drug allergies check your prescription carefully.   For the next 24 hours you can use MyChart to ask questions about today's visit, request a non-urgent call back, or ask for a work or school excuse. You will get an email in the next two days asking about your experience. I hope that your e-visit has been valuable and will speed your recovery.   I have spent 5 minutes in review of e-visit questionnaire, review and updating patient chart, medical decision making and response to patient.   Nilsa Macht Z Ward, PA-C    

## 2022-11-20 ENCOUNTER — Other Ambulatory Visit: Payer: Self-pay | Admitting: Family Medicine

## 2022-11-20 DIAGNOSIS — J301 Allergic rhinitis due to pollen: Secondary | ICD-10-CM

## 2022-12-02 DIAGNOSIS — Z419 Encounter for procedure for purposes other than remedying health state, unspecified: Secondary | ICD-10-CM | POA: Diagnosis not present

## 2022-12-10 ENCOUNTER — Encounter: Payer: Self-pay | Admitting: Family Medicine

## 2022-12-15 ENCOUNTER — Telehealth: Payer: 59 | Admitting: Physician Assistant

## 2022-12-15 DIAGNOSIS — M5442 Lumbago with sciatica, left side: Secondary | ICD-10-CM | POA: Diagnosis not present

## 2022-12-15 MED ORDER — TIZANIDINE HCL 4 MG PO TABS: 4.0000 mg | ORAL_TABLET | Freq: Four times a day (QID) | ORAL | 0 refills | Status: DC | PRN

## 2022-12-15 MED ORDER — NAPROXEN 500 MG PO TABS: 500.0000 mg | ORAL_TABLET | Freq: Two times a day (BID) | ORAL | 0 refills | Status: DC

## 2022-12-15 NOTE — Progress Notes (Signed)
We are sorry that you are not feeling well.  Here is how we plan to help!  Based on what you have shared with me it looks like you mostly have acute back pain.  Acute back pain is defined as musculoskeletal pain that can resolve in 1-3 weeks with conservative treatment.  I have prescribed Naprosyn 500 mg take one by mouth twice a day non-steroid anti-inflammatory (NSAID) as well as Tizanidine 2 mg every eight hours as needed which is a muscle relaxer  Some patients experience stomach irritation or in increased heartburn with anti-inflammatory drugs.  Please keep in mind that muscle relaxer's can cause fatigue and should not be taken while at work or driving.  Back pain is very common.  The pain often gets better over time.  The cause of back pain is usually not dangerous.  Most people can learn to manage their back pain on their own.  Recommend gentle stretching and light walking as tolerated.  The pain down your leg is likely nerve irritation due to inflammation.   Home Care Stay active.  Start with short walks on flat ground if you can.  Try to walk farther each day. Do not sit, drive or stand in one place for more than 30 minutes.  Do not stay in bed. Do not avoid exercise or work.  Activity can help your back heal faster. Be careful when you bend or lift an object.  Bend at your knees, keep the object close to you, and do not twist. Sleep on a firm mattress.  Lie on your side, and bend your knees.  If you lie on your back, put a pillow under your knees. Only take medicines as told by your doctor. Put ice on the injured area. Put ice in a plastic bag Place a towel between your skin and the bag Leave the ice on for 15-20 minutes, 3-4 times a day for the first 2-3 days. 210 After that, you can switch between ice and heat packs. Ask your doctor about back exercises or massage. Avoid feeling anxious or stressed.  Find good ways to deal with stress, such as exercise.  Get Help Right Way  If: Your pain does not go away with rest or medicine. Your pain does not go away in 1 week. You have new problems. You do not feel well. The pain spreads into your legs. You cannot control when you poop (bowel movement) or pee (urinate) You feel sick to your stomach (nauseous) or throw up (vomit) You have belly (abdominal) pain. You feel like you may pass out (faint). If you develop a fever.  Make Sure you: Understand these instructions. Will watch your condition Will get help right away if you are not doing well or get worse.  Your e-visit answers were reviewed by a board certified advanced clinical practitioner to complete your personal care plan.  Depending on the condition, your plan could have included both over the counter or prescription medications.  If there is a problem please reply  once you have received a response from your provider.  Your safety is important to [place].  If you have drug allergies check your prescription carefully.    You can use MyChart to ask questions about today's visit, request a non-urgent call back, or ask for a work or school excuse for 24 hours related to this e-Visit. If it has been greater than 24 hours you will need to follow up with your provider, or enter a new e-Visit  to address those concerns.  You will get an e-mail in the next two days asking about your experience.  I hope that your e-visit has been valuable and will speed your recovery. Thank you for using e-visits.   I have spent 5 minutes in review of e-visit questionnaire, review and updating patient chart, medical decision making and response to patient.   Tylene Fantasia Ward, PA-C

## 2022-12-16 ENCOUNTER — Ambulatory Visit: Payer: Medicaid Other | Admitting: Family Medicine

## 2022-12-26 ENCOUNTER — Encounter: Payer: Self-pay | Admitting: Family Medicine

## 2022-12-27 ENCOUNTER — Encounter: Payer: Self-pay | Admitting: Family Medicine

## 2022-12-27 ENCOUNTER — Ambulatory Visit: Payer: 59 | Admitting: Family Medicine

## 2022-12-27 VITALS — BP 136/88 | HR 82 | Temp 98.7°F | Ht 63.0 in | Wt 227.0 lb

## 2022-12-27 DIAGNOSIS — N3945 Continuous leakage: Secondary | ICD-10-CM | POA: Diagnosis not present

## 2022-12-27 DIAGNOSIS — R350 Frequency of micturition: Secondary | ICD-10-CM

## 2022-12-27 DIAGNOSIS — R339 Retention of urine, unspecified: Secondary | ICD-10-CM | POA: Diagnosis not present

## 2022-12-27 DIAGNOSIS — R351 Nocturia: Secondary | ICD-10-CM

## 2022-12-27 LAB — URINALYSIS, ROUTINE W REFLEX MICROSCOPIC
Bilirubin, UA: NEGATIVE
Glucose, UA: NEGATIVE
Leukocytes,UA: NEGATIVE
Nitrite, UA: NEGATIVE
Protein,UA: NEGATIVE
RBC, UA: NEGATIVE
Specific Gravity, UA: 1.025 (ref 1.005–1.030)
Urobilinogen, Ur: 0.2 mg/dL (ref 0.2–1.0)
pH, UA: 5 (ref 5.0–7.5)

## 2022-12-27 LAB — MICROSCOPIC EXAMINATION
RBC, Urine: NONE SEEN /hpf (ref 0–2)
Renal Epithel, UA: NONE SEEN /hpf

## 2022-12-27 NOTE — Patient Instructions (Signed)
Urinary Incontinence Urinary incontinence refers to a condition in which a person is unable to control where and when to pass urine. A person with this condition will urinate involuntarily. This means that the person urinates when he or she does not mean to. What are the causes? This condition may be caused by: Medicines. Infections. Constipation. Overactive bladder muscles. Weak bladder muscles. Weak pelvic floor muscles. These muscles provide support for the bladder, intestine, and, in women, the uterus. Enlarged prostate in men. The prostate is a gland near the bladder. When it gets too big, it can pinch the urethra. With the urethra blocked, the bladder can weaken and lose the ability to empty properly. Surgery. Emotional factors, such as anxiety, stress, or post-traumatic stress disorder (PTSD). Spinal cord injury, nerve injury, or other neurological conditions. Pelvic organ prolapse. This happens in women when organs move out of place and into the vagina. This movement can prevent the bladder and urethra from working properly. What increases the risk? The following factors may make you more likely to develop this condition: Age. The older you are, the higher the risk. Obesity. Being physically inactive. Pregnancy and childbirth. Menopause. Diseases that affect the nerves or spinal cord. Long-term, or chronic, coughing. This can increase pressure on the bladder and pelvic floor muscles. What are the signs or symptoms? Symptoms may vary depending on the type of urinary incontinence you have. They include: A sudden urge to urinate, and passing urine involuntarily before you can get to a bathroom (urge incontinence). Suddenly passing urine when doing activities that force urine to pass, such as coughing, laughing, exercising, or sneezing (stress incontinence). Needing to urinate often but urinating only a small amount, or constantly dribbling urine (overflow incontinence). Urinating  because you cannot get to the bathroom in time due to a physical disability, such as arthritis or injury, or due to a communication or thinking problem, such as Alzheimer's disease (functional incontinence). How is this diagnosed? This condition may be diagnosed based on: Your medical history. A physical exam. Tests, such as: Urine tests. X-rays of your kidney and bladder. Ultrasound. CT scan. Cystoscopy. In this procedure, a health care provider inserts a tube with a light and camera (cystoscope) through the urethra and into the bladder to check for problems. Urodynamic testing. These tests assess how well the bladder, urethra, and sphincter can store and release urine. There are different types of urodynamic tests, and they vary depending on what the test is measuring. To help diagnose your condition, your health care provider may recommend that you keep a log of when you urinate and how much you urinate. How is this treated? Treatment for this condition depends on the type of incontinence that you have and its cause. Treatment may include: Lifestyle changes, such as: Quitting smoking. Maintaining a healthy weight. Staying active. Try to get 150 minutes of moderate-intensity exercise every week. Ask your health care provider which activities are safe for you. Eating a healthy diet. Avoid high-fat foods, like fried foods. Avoid refined carbohydrates like white bread and white rice. Limit how much alcohol and caffeine you drink. Increase your fiber intake. Healthy sources of fiber include beans, whole grains, and fresh fruits and vegetables. Behavioral changes, such as: Pelvic floor muscle exercises. Bladder training, such as lengthening the amount of time between bathroom breaks, or using the bathroom at regular intervals. Using techniques to suppress bladder urges. This can include distraction techniques or controlled breathing exercises. Medicines, such as: Medicines to relax the  bladder   muscles and prevent bladder spasms. Medicines to help slow or prevent the growth of a man's prostate. Botox injections. These can help relax the bladder muscles. Treatments, such as: Using pulses of electricity to help change bladder reflexes (electrical nerve stimulation). For women, using a medical device to prevent urine leaks. This is a small, tampon-like, disposable device that is inserted into the urethra. Injecting collagen or carbon beads (bulking agents) into the urinary sphincter. These can help thicken tissue and close the bladder opening. Surgery. Follow these instructions at home: Lifestyle Limit alcohol and caffeine. These can fill your bladder quickly and irritate it. Keep yourself clean to help prevent odors and skin damage. Ask your health care provider about special skin creams and cleansers that can protect the skin from urine. Consider wearing pads or adult diapers. Make sure to change them regularly, and always change them right after experiencing incontinence. General instructions Take over-the-counter and prescription medicines only as told by your health care provider. Use the bathroom about every 3-4 hours, even if you do not feel the need to urinate. Try to empty your bladder completely every time. After urinating, wait a minute. Then try to urinate again. Make sure you are in a relaxed position while urinating. If your incontinence is caused by nerve problems, keep a log of the medicines you take and the times you go to the bathroom. Keep all follow-up visits. This is important. Where to find more information [employer] of Diabetes and Digestive and Kidney Diseases: CarFli[employer].tn American Urology Association: www.urologyhealth.org Contact a health care provider if: You have pain that gets worse. Your incontinence gets worse. Get help right away if: You have a fever or chills. You are unable to urinate. You have redness in your groin area or  down your legs. Summary Urinary incontinence refers to a condition in which a person is unable to control where and when to pass urine. This condition may be caused by medicines, infection, weak bladder muscles, weak pelvic floor muscles, enlargement of the prostate (in men), or surgery. Factors such as older age, obesity, pregnancy and childbirth, menopause, neurological diseases, and chronic coughing may increase your risk for developing this condition. Types of urinary incontinence include urge incontinence, stress incontinence, overflow incontinence, and functional incontinence. This condition is usually treated first with lifestyle and behavioral changes, such as quitting smoking, eating a healthier diet, and doing regular pelvic floor exercises. Other treatment options include medicines, bulking agents, medical devices, electrical nerve stimulation, or surgery. This information is not intended to replace advice given to you by your health care provider. Make sure you discuss any questions you have with your health care provider. Document Revised: 12/24/2019 Document Reviewed: 12/24/2019 Elsevier Patient Education  2024 [employer].

## 2022-12-27 NOTE — Progress Notes (Signed)
Subjective: CC: Urinary leakage PCP: Raliegh Ip, DO [name] is a 42 y.o. female presenting to clinic today for:  1.  Urinary leakage Patient reports that she has had over a decade of urinary leakage where she feels like she is constantly staying wet.  She gets raw between her legs from chafing.  This has been evaluated previously and felt to be vaginal discharge secondary to BV.  She does admit to some belly pain but denies any dysuria, hematuria, fevers or flank pain.  She feels like she cannot empty her bladder totally and often will have at least 4+ episodes of nocturia.  She reports urinating at least 10 times per day.  She has IUD in place.  No menses as a result of IUD   ROS: Per HPI  Allergies  Allergen Reactions   Penicillins     Hives and itching   Sulfa Antibiotics     Hives and itching    Azithromycin Anxiety and Palpitations    Chest pain and dizziness    Past Medical History:  Diagnosis Date   Anxiety    Arthritis    Depression    Ectopic pregnancy 12/29/2019   Treated with methotrexate   Endometriosis    Headache    Hx of tubal ligation 2008   Hypertension    Infection    UTI    Current Outpatient Medications:    albuterol (VENTOLIN HFA) 108 (90 Base) MCG/ACT inhaler, INHALE 1 TO 2 PUFFS BY MOUTH EVERY 6 HOURS AS NEEDED FOR WHEEZING OR SHORTNESS OF BREATH, Disp: 9 g, Rfl: 4   amLODipine (NORVASC) 5 MG tablet, Take 1 tablet (5 mg total) by mouth daily., Disp: 90 tablet, Rfl: 3   cetirizine (EQ ALLERGY RELIEF, CETIRIZINE,) 10 MG tablet, Take 1 tablet by mouth once daily, Disp: 90 tablet, Rfl: 1   gabapentin (NEURONTIN) 300 MG capsule, Take 1 capsule (300 mg total) by mouth 2 (two) times daily., Disp: 180 capsule, Rfl: 3   LORazepam (ATIVAN) 0.5 MG tablet, Take 0.5 tablets (0.25 mg total) by mouth every other day as needed for anxiety. USE SPARINGLY, Disp: 15 tablet, Rfl: 1   metoprolol succinate (TOPROL-XL) 25 MG 24 hr tablet, Take 1  tablet (25 mg total) by mouth daily., Disp: 90 tablet, Rfl: 3   naproxen (NAPROSYN) 500 MG tablet, Take 1 tablet (500 mg total) by mouth 2 (two) times daily with a meal., Disp: 30 tablet, Rfl: 0   pantoprazole (PROTONIX) 40 MG tablet, Take 1 tablet (40 mg total) by mouth daily., Disp: 90 tablet, Rfl: 3   tiZANidine (ZANAFLEX) 4 MG tablet, Take 1 tablet (4 mg total) by mouth every 6 (six) hours as needed for muscle spasms., Disp: 30 tablet, Rfl: 0   triamcinolone cream (KENALOG) 0.1 %, Apply 1 Application topically 2 (two) times daily., Disp: 30 g, Rfl: 0   Vitamin D, Ergocalciferol, (DRISDOL) 1.25 MG (50000 UNIT) CAPS capsule, Take 1 capsule (50,000 Units total) by mouth every 7 (seven) days. X12 weeks. Then start taking Vit D 400IU daily over the counter., Disp: 12 capsule, Rfl: 0  Current Facility-Administered Medications:    levonorgestrel (MIRENA) 20 MCG/24HR IUD 1 each, 1 each, Intrauterine, Once, Dettinger, Elige Radon, MD Social History   Socioeconomic History   Marital status: Divorced    Spouse name: Not on file   Number of children: Not on file   Years of education: Not on file   Highest education level: Some college, no  degree  Occupational History   Occupation: CMA  Tobacco Use   Smoking status: Former    Current packs/day: 0.00    Average packs/day: 1 pack/day for 13.6 years (13.6 ttl pk-yrs)    Types: Cigarettes    Start date: 05/29/2006    Quit date: 01/07/2020    Years since quitting: 2.9   Smokeless tobacco: Never  Vaping Use   Vaping status: Some Days  Substance and Sexual Activity   Alcohol use: Yes    Comment: occasional   Drug use: No   Sexual activity: Yes    Birth control/protection: Injection    Comment: BTL  Other Topics Concern   Not on file  Social History Narrative   Not on file   Social Determinants of Health   Financial Resource Strain: Low Risk  (09/15/2022)   Overall Financial Resource Strain (CARDIA)    Difficulty of Paying Living Expenses: Not  hard at all  Food Insecurity: No Food Insecurity (09/15/2022)   Hunger Vital Sign    Worried About Running Out of Food in the Last Year: Never true    Ran Out of Food in the Last Year: Never true  Transportation Needs: No Transportation Needs (09/15/2022)   PRAPARE - Administrator, Civil Service (Medical): No    Lack of Transportation (Non-Medical): No  Physical Activity: Unknown (09/15/2022)   Exercise Vital Sign    Days of Exercise per Week: 0 days    Minutes of Exercise per Session: Not on file  Stress: Stress Concern Present (09/15/2022)   [employer] of Occupational Health - Occupational Stress Questionnaire    Feeling of Stress : To some extent  Social Connections: Moderately Integrated (09/15/2022)   Social Connection and Isolation Panel [NHANES]    Frequency of Communication with Friends and Family: More than three times a week    Frequency of Social Gatherings with Friends and Family: More than three times a week    Attends Religious Services: More than 4 times per year    Active Member of [employer] or Organizations: No    Attends Engineer, structural: Not on file    Marital Status: Married  Catering manager Violence: Not on file   Family History  Problem Relation Age of Onset   Depression Mother    Stroke Mother    Diabetes Father    Hypertension Father    Stroke Father    Heart attack Father 30   Hypertension Sister    Hypertension Brother    Heart attack Brother 7       x3 MI (twin bro)   CAD Brother    Asthma Daughter    Breast cancer Paternal Grandmother 41   Hypertension Brother    Healthy Daughter     Objective: Office vital signs reviewed. BP 136/88   Pulse 82   Temp 98.7 F (37.1 C)   Ht 5\' 3"  (1.6 m)   Wt 227 lb (103 kg)   SpO2 96%   BMI 40.21 kg/m   Physical Examination:  General: Awake, alert, morbidly obese, No acute distress GU: Suprapubic tenderness to palpation present.  No palpable masses  Assessment/ Plan: 42  y.o. female   Urinary frequency - Plan: Urine Culture, Urinalysis, Routine w reflex microscopic, Ambulatory referral to Urogynecology  Incomplete bladder emptying - Plan: Ambulatory referral to Urogynecology  Nocturia - Plan: Ambulatory referral to Urogynecology  Continuous leakage of urine - Plan: Ambulatory referral to Urogynecology  No evidence of urinary  tract infection on urine dip.  Referral to urogynecology placed.  Discussed use of Kegel exercises in efforts to strengthen pelvic floor.  Abdominal exam significant for suprapubic tenderness. ?  Interstitial cystitis?  Had labs performed in April which showed prediabetes.  GFR was normal as well as electrolytes, liver enzymes etc.  No orders of the defined types were placed in this encounter.  No orders of the defined types were placed in this encounter.    Raliegh Ip, DO [facility] [phone]

## 2023-01-02 DIAGNOSIS — Z419 Encounter for procedure for purposes other than remedying health state, unspecified: Secondary | ICD-10-CM | POA: Diagnosis not present

## 2023-01-08 ENCOUNTER — Encounter: Payer: Self-pay | Admitting: Family Medicine

## 2023-01-08 DIAGNOSIS — K219 Gastro-esophageal reflux disease without esophagitis: Secondary | ICD-10-CM

## 2023-01-08 DIAGNOSIS — R1013 Epigastric pain: Secondary | ICD-10-CM

## 2023-01-13 ENCOUNTER — Ambulatory Visit: Payer: Medicaid Other | Admitting: Family Medicine

## 2023-01-13 ENCOUNTER — Encounter: Payer: Self-pay | Admitting: Family Medicine

## 2023-01-16 ENCOUNTER — Encounter: Payer: Self-pay | Admitting: Obstetrics and Gynecology

## 2023-01-16 ENCOUNTER — Ambulatory Visit (INDEPENDENT_AMBULATORY_CARE_PROVIDER_SITE_OTHER): Payer: 59 | Admitting: Obstetrics and Gynecology

## 2023-01-16 VITALS — BP 106/72 | HR 88 | Ht 62.8 in | Wt 223.0 lb

## 2023-01-16 DIAGNOSIS — N3946 Mixed incontinence: Secondary | ICD-10-CM | POA: Diagnosis not present

## 2023-01-16 DIAGNOSIS — L299 Pruritus, unspecified: Secondary | ICD-10-CM | POA: Diagnosis not present

## 2023-01-16 DIAGNOSIS — L089 Local infection of the skin and subcutaneous tissue, unspecified: Secondary | ICD-10-CM | POA: Diagnosis not present

## 2023-01-16 DIAGNOSIS — R350 Frequency of micturition: Secondary | ICD-10-CM

## 2023-01-16 DIAGNOSIS — N8110 Cystocele, unspecified: Secondary | ICD-10-CM

## 2023-01-16 DIAGNOSIS — N3281 Overactive bladder: Secondary | ICD-10-CM | POA: Diagnosis not present

## 2023-01-16 DIAGNOSIS — N816 Rectocele: Secondary | ICD-10-CM

## 2023-01-16 DIAGNOSIS — N814 Uterovaginal prolapse, unspecified: Secondary | ICD-10-CM

## 2023-01-16 LAB — POCT URINALYSIS DIPSTICK
Bilirubin, UA: NEGATIVE
Glucose, UA: NEGATIVE
Ketones, UA: NEGATIVE
Leukocytes, UA: NEGATIVE
Nitrite, UA: NEGATIVE
Protein, UA: NEGATIVE
Spec Grav, UA: 1.02 (ref 1.010–1.025)
Urobilinogen, UA: 0.2 E.U./dL
pH, UA: 6 (ref 5.0–8.0)

## 2023-01-16 MED ORDER — MIRABEGRON ER 25 MG PO TB24
25.0000 mg | ORAL_TABLET | Freq: Every day | ORAL | 5 refills | Status: DC
Start: 2023-01-16 — End: 2023-01-20
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
## 2023-01-16 NOTE — Patient Instructions (Addendum)
For the overactive bladder, start Myrbetriq 25mg  daily.   Consider options of pelvic floor PT  Information given on urethral bulking, bladder botox, and SNM.   Today we talked about ways to manage bladder urgency such as altering your diet to avoid irritative beverages and foods (bladder diet) as well as attempting to decrease stress and other exacerbating factors.  You can also chew a plain Tums 1-3 times per day to make your urine less acidic, especially if you have eating/drinking acidic things.   There is a website with helpful information for people with bladder irritation, called the IC Network at https://www.ic-network.com. This website has more information about a healthy bladder diet and patient forums for support.  The Most Bothersome Foods* The Least Bothersome Foods*  Coffee - Regular & Decaf Tea - caffeinated Carbonated beverages - cola, non-colas, diet & caffeine-free Alcohols - Beer, Red Wine, White Wine, [address] - Grapefruit, [place], Orange, [employer] - Cranberry, Grapefruit, Orange, Pineapple Vegetables - Tomato & Tomato Products Flavor Enhancers - Hot peppers, Spicy foods, Chili, Horseradish, Vinegar, Monosodium glutamate (MSG) Artificial Sweeteners - NutraSweet, Sweet 'N Low, Equal (sweetener), Saccharin Ethnic foods - [place], [place], [place] food [employer] - low-fat & whole Fruits - Bananas, Blueberries, Honeydew melon, Pears, Raisins, Watermelon Vegetables - Broccoli, [address] Sprouts, [place], Carrots, Cauliflower, [place], Cucumber, Mushrooms, Peas, Radishes, Squash, Zucchini, White potatoes, Sweet potatoes & yams Poultry - Chicken, Eggs, [place], [employer] - Beef, Diplomatic Services operational officer, Lamb Seafood - Shrimp, [place] fish, Salmon Grains - Oat, Rice Snacks - Pretzels, Popcorn  *Lenward Chancellor et al. Diet and its role in interstitial cystitis/bladder pain syndrome (IC/BPS) and comorbid conditions. [employer]. BJU Int. 2012 Jan 11.

## 2023-01-20 ENCOUNTER — Encounter: Payer: Self-pay | Admitting: Obstetrics and Gynecology

## 2023-01-20 MED ORDER — TROSPIUM CHLORIDE ER 60 MG PO CP24: 60.0000 mg | ORAL_CAPSULE | Freq: Every day | ORAL | 5 refills | Status: DC

## 2023-01-21 ENCOUNTER — Ambulatory Visit: Payer: 59 | Admitting: Gastroenterology

## 2023-01-21 ENCOUNTER — Encounter: Payer: Self-pay | Admitting: Gastroenterology

## 2023-01-23 NOTE — Progress Notes (Signed)
Patient insurance [facility] denied patients medication Mirabegron ER 25 mg. Due to the denial Trospium 60 mg has been sent to the pharmacy. Patient monitor medication for side effects.  Ref# [id number]

## 2023-02-02 DIAGNOSIS — Z419 Encounter for procedure for purposes other than remedying health state, unspecified: Secondary | ICD-10-CM | POA: Diagnosis not present

## 2023-02-03 ENCOUNTER — Encounter (INDEPENDENT_AMBULATORY_CARE_PROVIDER_SITE_OTHER): Payer: 59 | Admitting: Family Medicine

## 2023-02-03 DIAGNOSIS — K5909 Other constipation: Secondary | ICD-10-CM | POA: Diagnosis not present

## 2023-02-04 MED ORDER — LINACLOTIDE 145 MCG PO CAPS
145.0000 ug | ORAL_CAPSULE | Freq: Every day | ORAL | 12 refills | Status: DC
Start: 2023-02-04 — End: 2023-02-27

## 2023-02-04 NOTE — Telephone Encounter (Signed)
Please see the MyChart message reply(ies) for my assessment and plan.    This patient gave consent for this Medical Advice Message and is aware that it may result in a bill to their insurance company, as well as the possibility of receiving a bill for a co-payment or deductible. They are an established patient, but are not seeking medical advice exclusively about a problem treated during an in person or video visit in the last seven days. I did not recommend an in person or video visit within seven days of my reply.    I spent a total of 5 minutes cumulative time within 7 days through MyChart messaging.  Ashly Gottschalk, DO   

## 2023-02-17 ENCOUNTER — Encounter: Payer: Self-pay | Admitting: Family Medicine

## 2023-02-18 ENCOUNTER — Encounter: Payer: Self-pay | Admitting: Obstetrics and Gynecology

## 2023-02-19 ENCOUNTER — Encounter: Payer: Self-pay | Admitting: Obstetrics and Gynecology

## 2023-02-27 ENCOUNTER — Encounter: Payer: Self-pay | Admitting: Obstetrics and Gynecology

## 2023-02-27 ENCOUNTER — Ambulatory Visit: Payer: 59 | Admitting: Obstetrics and Gynecology

## 2023-02-27 VITALS — BP 118/83 | HR 84

## 2023-02-27 DIAGNOSIS — N3281 Overactive bladder: Secondary | ICD-10-CM

## 2023-02-27 DIAGNOSIS — N814 Uterovaginal prolapse, unspecified: Secondary | ICD-10-CM

## 2023-02-27 DIAGNOSIS — N8110 Cystocele, unspecified: Secondary | ICD-10-CM

## 2023-02-27 DIAGNOSIS — R102 Pelvic and perineal pain: Secondary | ICD-10-CM

## 2023-02-27 DIAGNOSIS — N816 Rectocele: Secondary | ICD-10-CM

## 2023-02-27 MED ORDER — CYCLOBENZAPRINE HCL 5 MG PO TABS
5.0000 mg | ORAL_TABLET | Freq: Three times a day (TID) | ORAL | 0 refills | Status: DC | PRN
Start: 2023-02-27 — End: 2023-05-23

## 2023-02-27 MED ORDER — IBUPROFEN 600 MG PO TABS
600.0000 mg | ORAL_TABLET | Freq: Four times a day (QID) | ORAL | 0 refills | Status: AC | PRN
Start: 2023-02-27 — End: ?

## 2023-02-27 NOTE — Progress Notes (Signed)
[facility] Return Visit  SUBJECTIVE  History of Present Illness: [name] is a 42 y.o. female seen in follow-up for overactive bladder and prolapse. Plan at last visit was start Myrbetriq once daily, her insurance did not cover Myrbetriq and did not accept a prior authorization.  Patient was switched to trospium 60 mg ER daily.  She reports that she has been doing well on this but she still has some leakage with urgency at times. Patient reports she has been experiencing more pressure in the vaginal region and feeling like she is having more of a bulge.  She also reports that she has been having rectal pressure and more difficulty with bowel movements.    Past Medical History: Patient  has a past medical history of Anxiety, Arthritis, Depression, Ectopic pregnancy (12/29/2019), Endometriosis, Headache, tubal ligation (2008), Hypertension, and Infection.   Past Surgical History: She  has a past surgical history that includes Tubal ligation and Skin graft.   Medications: She has a current medication list which includes the following prescription(s): cyclobenzaprine, ibuprofen, albuterol, amlodipine, eq allergy relief (cetirizine), gabapentin, linaclotide, lorazepam, metoprolol succinate, naproxen, pantoprazole, tizanidine, triamcinolone cream, trospium chloride, and vitamin d (ergocalciferol), and the following Facility-Administered Medications: levonorgestrel.   Allergies: Patient is allergic to penicillins, sulfa antibiotics, and azithromycin.   Social History: Patient  reports that she quit smoking about 3 years ago. Her smoking use included cigarettes. She started smoking about 16 years ago. She has a 13.6 pack-year smoking history. She has never used smokeless tobacco. She reports that she does not currently use alcohol. She reports that she does not use drugs.      OBJECTIVE     Physical Exam: Vitals:   02/27/23 0956  BP: 118/83  Pulse: 84   Gen: No apparent  distress, A&O x 3.  Detailed Urogynecologic Evaluation:   Upon bimanual evaluation patient does seem to have a slight worsening of prolapse on the posterior vaginal wall.  No sign of any bleeding infection or damage to surrounding tissues.  No sign of yeast or other explanation for patient's symptoms besides worsening of prolapse.   ASSESSMENT AND PLAN    [name] is a 42 y.o. with:  1. OAB (overactive bladder)   2. Prolapse of anterior vaginal wall   3. Uterine prolapse   4. Posterior vaginal wall prolapse   5. Pelvic pain in female    Patient would like to stay on trospium 60 mg ER daily at this time.  She does not report having any symptoms of dry mouth or dry eyes.  She did have a small amount of constipation in the beginning of use but reports that has since resolved. For patient's prolapse symptoms she would like to meet with a surgeon to discuss surgical options.  During today's visit we briefly discussed options that included both hysterectomy and non-hysterectomy as well as mesh versus nonmesh procedures.  Patient reports that she is okay with a hysterectomy potentially as she reports she is done having children. Patient was given information although sacrospinous fixation and a sacrocolpopexy.  Patient reports she also has SUI symptoms but has not been seen on exam encouraged her to come to next visit with a very full bladder so we can attempt to see leakage. 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  Flexeril 5 mg nightly sent in for patient.  Patient to follow-up for surgical consult and to see leakage

## 2023-03-01 ENCOUNTER — Telehealth: Payer: 59 | Admitting: Family Medicine

## 2023-03-01 DIAGNOSIS — M5441 Lumbago with sciatica, right side: Secondary | ICD-10-CM

## 2023-03-01 DIAGNOSIS — R102 Pelvic and perineal pain: Secondary | ICD-10-CM | POA: Diagnosis not present

## 2023-03-01 MED ORDER — CYCLOBENZAPRINE HCL 10 MG PO TABS
10.0000 mg | ORAL_TABLET | Freq: Three times a day (TID) | ORAL | 0 refills | Status: DC | PRN
Start: 2023-03-01 — End: 2023-05-23

## 2023-03-01 MED ORDER — NAPROXEN 500 MG PO TABS: 500.0000 mg | ORAL_TABLET | Freq: Two times a day (BID) | ORAL | 0 refills | Status: DC

## 2023-03-01 NOTE — Progress Notes (Signed)

## 2023-03-04 DIAGNOSIS — Z419 Encounter for procedure for purposes other than remedying health state, unspecified: Secondary | ICD-10-CM | POA: Diagnosis not present

## 2023-03-17 ENCOUNTER — Encounter: Payer: Self-pay | Admitting: Family Medicine

## 2023-03-17 ENCOUNTER — Ambulatory Visit: Payer: 59 | Admitting: Family Medicine

## 2023-03-17 NOTE — Progress Notes (Deleted)
Subjective: CC: Initial weight management appointment HPI: [name] is a 42 y.o. female that presents today for initial weight loss visit.  Patient reports that weight loss is important to them because ***.  They note that obesity ***run in the family.  Maximum weight: ***, Lowest weight: ***.  High school weight: ***.  Goal weight: ***.  Patient wishes to achieve the goal by: ***.  Medications tried in the past: ***.  Side effects: ***.  Diet is tried in the past: ***.  Eating triggers identified: ***.  Patient has ***seen a therapist in the past for eating behaviors.  Patient has ***seen a dietitian in the past.  Patient's occupation: ***.  Patient gets ***of sleep each night.  ***Apnea, frequent nighttime awakenings, poor energy, snoring.   Possible weight altering medications: ***Atypical antipsychotics/ antidepressents, oral corticosteroids, Depo-Provera, insulin  Past medical history ***for thyroid disease (including thyroid cancer), MEN, pancreatitis, diabetes, high blood pressure, high cholesterol, heart attack, stroke. ***Mental health disorders including addiction, depression, anxiety, bipolar disorder, OCD.  Family history *** for thyroid disease (including thyroid cancer), MEN, pancreatitis, diabetes, high blood pressure, high cholesterol, heart attack, stroke.  ***Mental health disorders including addiction, depression, anxiety, bipolar disorder, OCD.   24 hour food recall:  (Up at  AM) B ( AM)-   Snk ( AM)-   L ( PM)-   Snk ( PM)-   D ( PM)-   Snk ( PM)-   Typical day? {yes no:  Past Medical History:  Diagnosis Date   Anxiety    Arthritis    Depression    Ectopic pregnancy 12/29/2019   Treated with methotrexate   Endometriosis    Headache    Hx of tubal ligation 2008   Hypertension    Infection    UTI    Current Outpatient Medications:    albuterol (VENTOLIN HFA) 108 (90 Base) MCG/ACT inhaler, INHALE 1 TO 2 PUFFS BY MOUTH EVERY 6 HOURS AS NEEDED FOR  WHEEZING OR SHORTNESS OF BREATH, Disp: 9 g, Rfl: 4   amLODipine (NORVASC) 5 MG tablet, Take 1 tablet (5 mg total) by mouth daily., Disp: 90 tablet, Rfl: 3   cetirizine (EQ ALLERGY RELIEF, CETIRIZINE,) 10 MG tablet, Take 1 tablet by mouth once daily, Disp: 90 tablet, Rfl: 1   cyclobenzaprine (FLEXERIL) 10 MG tablet, Take 1 tablet (10 mg total) by mouth 3 (three) times daily as needed for muscle spasms., Disp: 30 tablet, Rfl: 0   cyclobenzaprine (FLEXERIL) 5 MG tablet, Take 1 tablet (5 mg total) by mouth 3 (three) times daily as needed for muscle spasms., Disp: 60 tablet, Rfl: 0   gabapentin (NEURONTIN) 300 MG capsule, Take 1 capsule (300 mg total) by mouth 2 (two) times daily., Disp: 180 capsule, Rfl: 3   ibuprofen (ADVIL) 600 MG tablet, Take 1 tablet (600 mg total) by mouth every 6 (six) hours as needed., Disp: 30 tablet, Rfl: 0   LORazepam (ATIVAN) 0.5 MG tablet, Take 0.5 tablets (0.25 mg total) by mouth every other day as needed for anxiety. USE SPARINGLY, Disp: 15 tablet, Rfl: 1   metoprolol succinate (TOPROL-XL) 25 MG 24 hr tablet, Take 1 tablet (25 mg total) by mouth daily., Disp: 90 tablet, Rfl: 3   naproxen (NAPROSYN) 500 MG tablet, Take 1 tablet (500 mg total) by mouth 2 (two) times daily with a meal., Disp: 30 tablet, Rfl: 0   pantoprazole (PROTONIX) 40 MG tablet, Take 1 tablet (40 mg total) by mouth daily., Disp: 90  tablet, Rfl: 3   tiZANidine (ZANAFLEX) 4 MG tablet, Take 1 tablet (4 mg total) by mouth every 6 (six) hours as needed for muscle spasms., Disp: 30 tablet, Rfl: 0   triamcinolone cream (KENALOG) 0.1 %, Apply 1 Application topically 2 (two) times daily., Disp: 30 g, Rfl: 0   Trospium Chloride 60 MG CP24, Take 1 capsule (60 mg total) by mouth daily., Disp: 30 capsule, Rfl: 5  Current Facility-Administered Medications:    levonorgestrel (MIRENA) 20 MCG/24HR IUD 1 each, 1 each, Intrauterine, Once, Dettinger, Elige Radon, MD Social History   Socioeconomic History   Marital status:  Divorced    Spouse name: Not on file   Number of children: Not on file   Years of education: Not on file   Highest education level: Some college, no degree  Occupational History   Occupation: CMA  Tobacco Use   Smoking status: Former    Current packs/day: 0.00    Average packs/day: 1 pack/day for 13.6 years (13.6 ttl pk-yrs)    Types: Cigarettes    Start date: 05/29/2006    Quit date: 01/07/2020    Years since quitting: 3.1   Smokeless tobacco: Never  Vaping Use   Vaping status: Some Days  Substance and Sexual Activity   Alcohol use: Not Currently    Comment: occasional   Drug use: No   Sexual activity: Not Currently    Birth control/protection: I.U.D.  Other Topics Concern   Not on file  Social History Narrative   Not on file   Social Determinants of Health   Financial Resource Strain: Low Risk  (03/17/2023)   Overall Financial Resource Strain (CARDIA)    Difficulty of Paying Living Expenses: Not hard at all  Food Insecurity: No Food Insecurity (03/17/2023)   Hunger Vital Sign    Worried About Running Out of Food in the Last Year: Never true    Ran Out of Food in the Last Year: Never true  Transportation Needs: No Transportation Needs (03/17/2023)   PRAPARE - Administrator, Civil Service (Medical): No    Lack of Transportation (Non-Medical): No  Physical Activity: Unknown (03/17/2023)   Exercise Vital Sign    Days of Exercise per Week: 0 days    Minutes of Exercise per Session: Not on file  Stress: Stress Concern Present (03/17/2023)   [employer] of Occupational Health - Occupational Stress Questionnaire    Feeling of Stress : Rather much  Social Connections: Moderately Isolated (03/17/2023)   Social Connection and Isolation Panel [NHANES]    Frequency of Communication with Friends and Family: Twice a week    Frequency of Social Gatherings with Friends and Family: Twice a week    Attends Religious Services: More than 4 times per year    Active  Member of [employer] or Organizations: No    Attends Engineer, structural: Not on file    Marital Status: Separated  Intimate Partner Violence: Not on file   Allergies  Allergen Reactions   Penicillins     Hives and itching   Sulfa Antibiotics     Hives and itching    Azithromycin Anxiety and Palpitations    Chest pain and dizziness    Family History  Problem Relation Age of Onset   Depression Mother    Stroke Mother    Diabetes Father    Hypertension Father    Stroke Father    Heart attack Father 30   Hypertension Sister  Hypertension Brother    Heart attack Brother 30       x3 MI (twin bro)   CAD Brother    Asthma Daughter    Breast cancer Paternal Grandmother 24   Hypertension Brother    Healthy Daughter     ROS: Per HPI  Objective: Office vital signs reviewed. There were no vitals taken for this visit.  Physical Examination:  General: Awake, alert, *** nourished, No acute distress HEENT: Normal    Neck: No masses palpated. Thyroid *** palpable.  Girth: *** cm    Eyes: PERRLA, extraocular movement in tact, sclera ***; *** exophthalmos     Throat: moist mucus membranes, Airway is patent Cardio: regular rate and rhythm, S1S2 heard, no murmurs appreciated Pulm: clear to auscultation bilaterally, no wheezes, rhonchi or rales; normal work of breathing on room air GI: soft, non-tender, non-distended, bowel sounds present x4, no hepatomegaly, no splenomegaly, no masses  Waist circumference: *** cm Extremities: warm, well perfused, No edema, cyanosis or clubbing; +*** pulses bilaterally MSK: *** gait and *** station Skin: dry; intact; no rashes or lesions; ***acanthosis nigricans observed Psych: ***Mood stable, speech normal, affect appropriate, good eye contact     12/27/2022    9:40 AM 09/16/2022    2:42 PM 11/07/2021    2:58 PM  Depression screen PHQ 2/9  Decreased Interest 0 0 0  Down, Depressed, Hopeless 1 0 0  PHQ - 2 Score 1 0 0  Altered sleeping 3  0 0  Tired, decreased energy 1 3 0  Change in appetite 0 3 0  Feeling bad or failure about yourself  0 0 0  Trouble concentrating 0 1 0  Moving slowly or fidgety/restless 0 0 0  Suicidal thoughts 0 0 0  PHQ-9 Score 5 7 0  Difficult doing work/chores Somewhat difficult Not difficult at all Not difficult at all       12/27/2022    9:40 AM 09/16/2022    2:49 PM 09/16/2022    2:42 PM 11/07/2021    2:58 PM  GAD 7 : Generalized Anxiety Score  Nervous, Anxious, on Edge 0  0 0  Control/stop worrying 0  0 0  Worry too much - different things 0  0 0  Trouble relaxing 0  0 0  Restless 0  0 0  Easily annoyed or irritable 3 2 0 0  Afraid - awful might happen 0  0 0  Total GAD 7 Score 3  0 0  Anxiety Difficulty Somewhat difficult  Not difficult at all Not difficult at all    Measurements: Neck Girth: *** cm Bust: *** cm Waist circumference: *** cm  Assessment/ Plan: 42 y.o. femalehere for initial weight management visit.  No problem-specific Assessment & Plan notes found for this encounter.   Raliegh Ip, DO [facility] [phone]

## 2023-03-18 ENCOUNTER — Encounter: Payer: Self-pay | Admitting: Obstetrics

## 2023-03-18 ENCOUNTER — Ambulatory Visit (INDEPENDENT_AMBULATORY_CARE_PROVIDER_SITE_OTHER): Payer: 59 | Admitting: Obstetrics

## 2023-03-18 VITALS — BP 116/83 | HR 87

## 2023-03-18 DIAGNOSIS — N814 Uterovaginal prolapse, unspecified: Secondary | ICD-10-CM | POA: Diagnosis not present

## 2023-03-18 DIAGNOSIS — K5900 Constipation, unspecified: Secondary | ICD-10-CM | POA: Diagnosis not present

## 2023-03-18 DIAGNOSIS — Z6839 Body mass index (BMI) 39.0-39.9, adult: Secondary | ICD-10-CM | POA: Diagnosis not present

## 2023-03-18 DIAGNOSIS — N3946 Mixed incontinence: Secondary | ICD-10-CM | POA: Diagnosis not present

## 2023-03-18 MED ORDER — GEMTESA 75 MG PO TABS
75.0000 mg | ORAL_TABLET | Freq: Every day | ORAL | 0 refills | Status: DC
Start: 2023-03-18 — End: 2023-04-11

## 2023-03-18 MED ORDER — GEMTESA 75 MG PO TABS
75.0000 mg | ORAL_TABLET | Freq: Every day | ORAL | 2 refills | Status: DC
Start: 2023-03-18 — End: 2023-04-11

## 2023-03-18 NOTE — Patient Instructions (Signed)
We discussed the symptoms of overactive bladder (OAB), which include urinary urgency, urinary frequency, night-time urination, with or without urge incontinence.  We discussed management including behavioral therapy (decreasing bladder irritants by following a bladder diet, urge suppression strategies, timed voids, bladder retraining), physical therapy, medication; and for refractory cases posterior tibial nerve stimulation, sacral neuromodulation, and intravesical botulinum toxin injection.   Stop trospium due to constipation.  For Beta-3 agonist medication, we discussed the potential side effect of elevated blood pressure which is more likely to occur in individuals with uncontrolled hypertension. You were given samples for Gemtesa 75 mg.  It can take a month to start working so give it time, but if you have bothersome side effects call sooner and we can try a different medication.  Call us if you have trouble filling the prescription or if it's not covered by your insurance.  Constipation: Our goal is to achieve formed bowel movements daily or every-other-day.  You may need to try different combinations of the following options to find what works best for you - everybody's body works differently so feel free to adjust the dosages as needed.  Some options to help maintain bowel health include:  Dietary changes (more leafy greens, vegetables and fruits; less processed foods) Fiber supplementation (Benefiber, FiberCon, Metamucil or Psyllium). Start slow and increase gradually to full dose. Over-the-counter agents such as: stool softeners (Docusate or Colace) and/or laxatives (Miralax, milk of magnesia)  "Power Pudding" is a natural mixture that may help your constipation.  To make blend 1 cup applesauce, 1 cup wheat bran, and 3/4 cup prune juice, refrigerate and then take 1 tablespoon daily with a large glass of water as needed.
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
## 2023-03-19 DIAGNOSIS — N3946 Mixed incontinence: Secondary | ICD-10-CM | POA: Insufficient documentation

## 2023-03-19 DIAGNOSIS — K5900 Constipation, unspecified: Secondary | ICD-10-CM | POA: Insufficient documentation

## 2023-03-19 DIAGNOSIS — N3941 Urge incontinence: Secondary | ICD-10-CM | POA: Insufficient documentation

## 2023-03-19 DIAGNOSIS — Z6839 Body mass index (BMI) 39.0-39.9, adult: Secondary | ICD-10-CM | POA: Insufficient documentation

## 2023-03-19 DIAGNOSIS — N814 Uterovaginal prolapse, unspecified: Secondary | ICD-10-CM | POA: Insufficient documentation

## 2023-03-19 NOTE — Assessment & Plan Note (Signed)
-   weight gain since discontinuation of alcohol use - consistent weight loss since 2023 per chart review - encouraged pt to continue diet modification and active lifestyle - pending return PCP visit to start medication for weight loss - discussed increased risk of perioperative complications with elevated BMI

## 2023-03-19 NOTE — Assessment & Plan Note (Addendum)
-   For treatment of pelvic organ prolapse, we discussed options for management including expectant management, conservative management, and surgical management, such as Kegels, a pessary, pelvic floor physical therapy, and specific surgical procedures. We discussed two options for prolapse repair:  1) vaginal repair without mesh - Pros - safer, no mesh complications - Cons - not as strong as mesh repair, higher risk of recurrence  2) laparoscopic repair with mesh - Pros - stronger, better long-term success - Cons - risks of mesh implant (erosion into vagina or bladder, adhering to the rectum, pain) - these risks are lower than with a vaginal mesh but still exist - pt desires to proceed with exam under anesthesia, robotic assisted laparoscopic hysterectomy, bilateral salpingectomy, sacrocolpopexy, possible anterior/posterior colporrhaphy, perineoplasty, cystoscopy.  - discussed risk of conversion to vaginal procedure due to pelvic adhesions with history of endometriosis and BMI

## 2023-03-19 NOTE — Assessment & Plan Note (Signed)
-   urgency > stress - Mirabegron not covered by insurance, Rx changed to Trospium with constipation requiring enema and dulcolax - will avoid anti-cholinergic due to significant constipation - samples provided for Gemtesa, discussed risk of angioedema and skin rash to stop medication and seek urgent evaluation - return for urodynamics to assess need for anti-incontinence procedure at the time of prolapse repair

## 2023-03-19 NOTE — Assessment & Plan Note (Signed)
-   s/p dulcolax and enema - stop trospium  - For constipation, we reviewed the importance of a better bowel regimen.  We also discussed the importance of avoiding chronic straining, as it can exacerbate her pelvic floor symptoms; we discussed treating constipation and straining prior to surgery, as postoperative straining can lead to damage to the repair and recurrence of symptoms. We discussed initiating therapy with increasing fluid intake, fiber supplementation, stool softeners, and laxatives such as miralax.

## 2023-03-20 ENCOUNTER — Telehealth: Payer: 59 | Admitting: Family Medicine

## 2023-03-20 DIAGNOSIS — K649 Unspecified hemorrhoids: Secondary | ICD-10-CM

## 2023-03-20 MED ORDER — HYDROCORTISONE ACETATE 25 MG RE SUPP
25.0000 mg | Freq: Two times a day (BID) | RECTAL | 0 refills | Status: DC
Start: 2023-03-20 — End: 2023-04-09

## 2023-03-20 NOTE — Progress Notes (Signed)

## 2023-03-28 ENCOUNTER — Ambulatory Visit: Payer: 59

## 2023-03-28 ENCOUNTER — Encounter: Payer: Self-pay | Admitting: Obstetrics and Gynecology

## 2023-03-28 MED ORDER — NITROFURANTOIN MONOHYD MACRO 100 MG PO CAPS
100.0000 mg | ORAL_CAPSULE | Freq: Two times a day (BID) | ORAL | 0 refills | Status: AC
Start: 2023-03-28 — End: 2023-04-02

## 2023-04-04 DIAGNOSIS — Z419 Encounter for procedure for purposes other than remedying health state, unspecified: Secondary | ICD-10-CM | POA: Diagnosis not present

## 2023-04-09 ENCOUNTER — Encounter: Payer: Self-pay | Admitting: Family Medicine

## 2023-04-09 ENCOUNTER — Ambulatory Visit (INDEPENDENT_AMBULATORY_CARE_PROVIDER_SITE_OTHER): Payer: 59 | Admitting: Obstetrics and Gynecology

## 2023-04-09 ENCOUNTER — Encounter: Payer: Self-pay | Admitting: Obstetrics and Gynecology

## 2023-04-09 VITALS — BP 128/85 | HR 80

## 2023-04-09 DIAGNOSIS — N393 Stress incontinence (female) (male): Secondary | ICD-10-CM

## 2023-04-09 DIAGNOSIS — N3281 Overactive bladder: Secondary | ICD-10-CM | POA: Diagnosis not present

## 2023-04-09 DIAGNOSIS — N3946 Mixed incontinence: Secondary | ICD-10-CM

## 2023-04-09 LAB — POCT URINALYSIS DIPSTICK
Bilirubin, UA: NEGATIVE
Blood, UA: NEGATIVE
Glucose, UA: NEGATIVE
Ketones, UA: NEGATIVE
Leukocytes, UA: NEGATIVE
Nitrite, UA: NEGATIVE
Protein, UA: NEGATIVE
Spec Grav, UA: 1.02 (ref 1.010–1.025)
Urobilinogen, UA: 0.2 U/dL
pH, UA: 6 (ref 5.0–8.0)

## 2023-04-09 NOTE — Progress Notes (Signed)
[facility] Urodynamics Procedure  Referring Physician: Raliegh Ip, DO Date of Procedure: 04/09/2023  [name] is a 42 y.o. female who presents for urodynamic evaluation. Indication(s) for study: SUI and OAB  Vital Signs: BP 128/85   Pulse 80   Laboratory Results: A clean catch urine specimen revealed:  POC urine:  Lab Results  Component Value Date   COLORU yellow 01/16/2023   CLARITYU clear 01/16/2023   GLUCOSEUR Negative 01/16/2023   BILIRUBINUR negative 01/16/2023   KETONESU negative 01/16/2023   SPECGRAV 1.020 01/16/2023   RBCUR trace intact 01/16/2023   PHUR 6.0 01/16/2023   PROTEINUR Negative 01/16/2023   UROBILINOGEN 0.2 01/16/2023   LEUKOCYTESUR Negative 01/16/2023      Voiding Diary: Deferred   Procedure Timeout:  The correct patient was verified and the correct procedure was verified. The patient was in the correct position and safety precautions were reviewed based on at the patient's history.  Urodynamic Procedure A 326F dual lumen urodynamics catheter was placed under sterile conditions into the patient's bladder. A 326F catheter was placed into the rectum in order to measure abdominal pressure. EMG patches were placed in the appropriate position.  All connections were confirmed and calibrations/adjusted made. Saline was instilled into the bladder through the dual lumen catheters.  Cough/valsalva pressures were measured periodically during filling.  Patient was allowed to void.  The bladder was then emptied of its residual.  UROFLOW: Revealed a Qmax of 17.2 mL/sec.  She voided 350 mL and had a residual of 110 mL.  It was a normal pattern and represented normal habits.  CMG: This was performed with sterile water in the sitting position at a fill rate of 20-40 mL/min.    First sensation of fullness was 30 mLs,  First urge was 98 mLs,  Strong urge was 275 mLs and  Capacity was 770 mLs  Stress incontinence was demonstrated Highest  positive Barrier CLPP was 187 cmH20 at 200 ml. Highest positive Barrier VLPP was 65 cmH20 at 200 ml.  Detrusor function was overactive, with phasic contractions seen.  The first occurred at 70 mL to 3 cm of water and was not associated with leak.  Compliance:  Normal. End fill detrusor pressure was 6.2 cmH20.  Calculated compliance was 126mL/cmH20  UPP: MUCP with barrier reduction was 108 cm of water.    MICTURITION STUDY: Voiding was performed with reduction using scopettes in the sitting position.  Pdet at Qmax was 12.8 cm of water.  It was a interrupted pattern as patient had to be moved to the toilet to urinate and she could not void around the catheter. Once the catheter was removed she was able to void.  She voided 730 mL and had a residual of 40 mL.  It was a volitional void, sustained detrusor contraction was not present throughout void, but she did have detrusor activity. Abdominal straining was present  EMG: This was performed with patches.  She had voluntary contractions, recruitment with fill was present and urethral sphincter was not relaxed with void.  The details of the procedure with the study tracings have been scanned into EPIC.   Urodynamic Impression:  1. Sensation was increased; capacity was normal 2. Stress Incontinence was demonstrated at normal pressures; 3. Detrusor Overactivity was demonstrated without leakage. 4. Emptying was dysfunctional with a normal PVR, a sustained detrusor contraction not present,  abdominal straining present, dyssynergic urethral sphincter activity on EMG.  Plan: - The patient will follow up  to discuss  the findings and treatment options.

## 2023-04-10 ENCOUNTER — Other Ambulatory Visit: Payer: Self-pay | Admitting: *Deleted

## 2023-04-10 ENCOUNTER — Other Ambulatory Visit: Payer: Self-pay | Admitting: Obstetrics

## 2023-04-11 ENCOUNTER — Telehealth: Payer: 59 | Admitting: Physician Assistant

## 2023-04-11 ENCOUNTER — Other Ambulatory Visit: Payer: Self-pay

## 2023-04-11 ENCOUNTER — Encounter: Payer: Self-pay | Admitting: Obstetrics

## 2023-04-11 DIAGNOSIS — R110 Nausea: Secondary | ICD-10-CM | POA: Diagnosis not present

## 2023-04-11 MED ORDER — GEMTESA 75 MG PO TABS: 75.0000 mg | ORAL_TABLET | Freq: Every day | ORAL | 2 refills | Status: DC

## 2023-04-11 MED ORDER — ONDANSETRON HCL 4 MG PO TABS: 4.0000 mg | ORAL_TABLET | Freq: Three times a day (TID) | ORAL | 0 refills | Status: DC | PRN

## 2023-04-11 NOTE — Progress Notes (Signed)

## 2023-04-16 ENCOUNTER — Telehealth: Payer: 59 | Admitting: Physician Assistant

## 2023-04-16 DIAGNOSIS — B3731 Acute candidiasis of vulva and vagina: Secondary | ICD-10-CM

## 2023-04-16 MED ORDER — FLUCONAZOLE 150 MG PO TABS: 150.0000 mg | ORAL_TABLET | Freq: Every day | ORAL | 0 refills | Status: DC

## 2023-04-16 NOTE — Progress Notes (Signed)
I have spent 5 minutes in review of e-visit questionnaire, review and updating patient chart, medical decision making and response to patient.   Mia Milan Cody Jacklynn Dehaas, PA-C    

## 2023-04-16 NOTE — Progress Notes (Signed)

## 2023-04-17 ENCOUNTER — Telehealth: Payer: Self-pay

## 2023-04-17 DIAGNOSIS — N3946 Mixed incontinence: Secondary | ICD-10-CM

## 2023-04-17 NOTE — Telephone Encounter (Signed)
Dr.Yuen,  PA and the request for appeal have been denied for [facility]. Please see paper copy of denial for details.

## 2023-04-18 ENCOUNTER — Encounter: Payer: Self-pay | Admitting: Obstetrics

## 2023-04-18 MED ORDER — MIRABEGRON ER 25 MG PO TB24: 25.0000 mg | ORAL_TABLET | Freq: Every day | ORAL | 0 refills | Status: DC

## 2023-04-18 MED ORDER — MIRABEGRON ER 50 MG PO TB24: 50.0000 mg | ORAL_TABLET | Freq: Every day | ORAL | 1 refills | Status: DC

## 2023-04-21 NOTE — Telephone Encounter (Signed)
Can you verify the dosing for the patients Mybegron. I see 2 doses in her chart a 25mg  or 50mg .

## 2023-04-23 ENCOUNTER — Ambulatory Visit: Payer: 59 | Admitting: Obstetrics

## 2023-04-28 ENCOUNTER — Encounter: Payer: Self-pay | Admitting: Obstetrics

## 2023-05-04 DIAGNOSIS — Z419 Encounter for procedure for purposes other than remedying health state, unspecified: Secondary | ICD-10-CM | POA: Diagnosis not present

## 2023-05-11 ENCOUNTER — Telehealth: Payer: 59 | Admitting: Physician Assistant

## 2023-05-11 DIAGNOSIS — J069 Acute upper respiratory infection, unspecified: Secondary | ICD-10-CM

## 2023-05-11 MED ORDER — FLUTICASONE PROPIONATE 50 MCG/ACT NA SUSP: 2.0000 | Freq: Every day | NASAL | 6 refills | Status: DC

## 2023-05-11 MED ORDER — PROMETHAZINE-DM 6.25-15 MG/5ML PO SYRP
5.0000 mL | ORAL_SOLUTION | Freq: Four times a day (QID) | ORAL | 0 refills | Status: AC | PRN
Start: 2023-05-11 — End: 2023-05-21

## 2023-05-11 NOTE — Progress Notes (Signed)

## 2023-05-16 ENCOUNTER — Telehealth: Payer: 59 | Admitting: Physician Assistant

## 2023-05-16 DIAGNOSIS — B9689 Other specified bacterial agents as the cause of diseases classified elsewhere: Secondary | ICD-10-CM

## 2023-05-16 MED ORDER — DOXYCYCLINE HYCLATE 100 MG PO TABS: 100.0000 mg | ORAL_TABLET | Freq: Two times a day (BID) | ORAL | 0 refills | Status: DC

## 2023-05-16 NOTE — Progress Notes (Signed)

## 2023-05-19 ENCOUNTER — Encounter: Payer: Self-pay | Admitting: Family Medicine

## 2023-05-19 ENCOUNTER — Telehealth: Payer: 59 | Admitting: Family Medicine

## 2023-05-19 DIAGNOSIS — M79604 Pain in right leg: Secondary | ICD-10-CM

## 2023-05-19 DIAGNOSIS — M545 Low back pain, unspecified: Secondary | ICD-10-CM

## 2023-05-19 NOTE — Progress Notes (Signed)
E-Visit for Back Pain   We are sorry that you are not feeling well.  Here is how we plan to help!  Based on what you have shared with me it looks like you mostly have acute back pain.  Acute back pain is defined as musculoskeletal pain that can resolve in 1-3 weeks with conservative treatment.  Work note is in chart.   I recommend using Advil a non-steroid anti-inflammatory (NSAID) as well as your Flexeril you have had ordered recently. Some patients experience stomach irritation or in increased heartburn with anti-inflammatory drugs.  Please keep in mind that muscle relaxer's can cause fatigue and should not be taken while at work or driving.  Back pain is very common.  The pain often gets better over time.  The cause of back pain is usually not dangerous.  Most people can learn to manage their back pain on their own.  Home Care Stay active.  Start with short walks on flat ground if you can.  Try to walk farther each day. Do not sit, drive or stand in one place for more than 30 minutes.  Do not stay in bed. Do not avoid exercise or work.  Activity can help your back heal faster. Be careful when you bend or lift an object.  Bend at your knees, keep the object close to you, and do not twist. Sleep on a firm mattress.  Lie on your side, and bend your knees.  If you lie on your back, put a pillow under your knees. Only take medicines as told by your doctor. Put ice on the injured area. Put ice in a plastic bag Place a towel between your skin and the bag Leave the ice on for 15-20 minutes, 3-4 times a day for the first 2-3 days. 210 After that, you can switch between ice and heat packs. Ask your doctor about back exercises or massage. Avoid feeling anxious or stressed.  Find good ways to deal with stress, such as exercise.  Get Help Right Way If: Your pain does not go away with rest or medicine. Your pain does not go away in 1 week. You have new problems. You do not feel well. The pain  spreads into your legs. You cannot control when you poop (bowel movement) or pee (urinate) You feel sick to your stomach (nauseous) or throw up (vomit) You have belly (abdominal) pain. You feel like you may pass out (faint). If you develop a fever.  Make Sure you: Understand these instructions. Will watch your condition Will get help right away if you are not doing well or get worse.  Your e-visit answers were reviewed by a board certified advanced clinical practitioner to complete your personal care plan.  Depending on the condition, your plan could have included both over the counter or prescription medications.  If there is a problem please reply  once you have received a response from your provider.  Your safety is important to [place].  If you have drug allergies check your prescription carefully.    You can use MyChart to ask questions about today's visit, request a non-urgent call back, or ask for a work or school excuse for 24 hours related to this e-Visit. If it has been greater than 24 hours you will need to follow up with your provider, or enter a new e-Visit to address those concerns.  You will get an e-mail in the next two days asking about your experience.  I hope that your e-visit  has been valuable and will speed your recovery. Thank you for using e-visits.  I provided 5 minutes of non face-to-face time during this encounter for chart review, medication and order placement, as well as and documentation.

## 2023-05-21 ENCOUNTER — Encounter: Payer: Self-pay | Admitting: Obstetrics

## 2023-05-21 ENCOUNTER — Ambulatory Visit (INDEPENDENT_AMBULATORY_CARE_PROVIDER_SITE_OTHER): Payer: 59 | Admitting: Obstetrics

## 2023-05-21 VITALS — BP 140/80 | HR 76

## 2023-05-21 DIAGNOSIS — N814 Uterovaginal prolapse, unspecified: Secondary | ICD-10-CM | POA: Diagnosis not present

## 2023-05-21 DIAGNOSIS — K5900 Constipation, unspecified: Secondary | ICD-10-CM

## 2023-05-21 DIAGNOSIS — M5441 Lumbago with sciatica, right side: Secondary | ICD-10-CM

## 2023-05-21 DIAGNOSIS — N3946 Mixed incontinence: Secondary | ICD-10-CM

## 2023-05-21 DIAGNOSIS — G8929 Other chronic pain: Secondary | ICD-10-CM

## 2023-05-21 DIAGNOSIS — Z6839 Body mass index (BMI) 39.0-39.9, adult: Secondary | ICD-10-CM

## 2023-05-21 MED ORDER — ACETAMINOPHEN 500 MG PO TABS: 500.0000 mg | ORAL_TABLET | Freq: Four times a day (QID) | ORAL | 0 refills | Status: AC | PRN

## 2023-05-21 MED ORDER — OXYCODONE HCL 5 MG PO TABS: 5.0000 mg | ORAL_TABLET | ORAL | 0 refills | Status: AC | PRN

## 2023-05-21 MED ORDER — POLYETHYLENE GLYCOL 3350 17 GM/SCOOP PO POWD: 17.0000 g | Freq: Every day | ORAL | 0 refills | Status: AC

## 2023-05-21 NOTE — Assessment & Plan Note (Signed)
-   s/p dulcolax and enema - stopped trospium - reduced suprapubic discomfort since start miralax, encouraged to continue titration and possible need to increase postop  - discussed need to optimize stool consistency to reduce straining and POP recurrence  - For constipation, we reviewed the importance of a better bowel regimen.  We also discussed the importance of avoiding chronic straining, as it can exacerbate her pelvic floor symptoms; we discussed treating constipation and straining prior to surgery, as postoperative straining can lead to damage to the repair and recurrence of symptoms. We discussed initiating therapy with increasing fluid intake, fiber supplementation, stool softeners, and laxatives such as miralax.

## 2023-05-21 NOTE — Assessment & Plan Note (Signed)
-   urgency > stress - UDS with uroflow void, PVR . CMG showed increased sensation, normal capacity . SUI noted with DOI. VLPP 65cm H2O. Pressure flow Pdet at Qmax was 12.8 cm. PVR 40mL after catheter removed. Dyssynergic EMG with valsalva void. Discussed increased risk of prolonged urinary retention and catheterization based on UDS results, encouraged pt to consider pelvic floor PT, staged procedure or concomitant periurethral bulking. Patient expresses understanding,  however declines and desires to proceed with midurethral sling with an understanding of her increased risk. - Mirabegron and Gemtesa not covered by insurance, Trospium worsened constipation requiring enema and dulcolax - will avoid anti-cholinergic due to significant constipation - discussed possible need for additional treatment postop if refractory urinary leakage symptoms.

## 2023-05-21 NOTE — Assessment & Plan Note (Signed)
-   For treatment of pelvic organ prolapse, we discussed options for management including expectant management, conservative management, and surgical management, such as Kegels, a pessary, pelvic floor physical therapy, and specific surgical procedures. We discussed two options for prolapse repair:  1) vaginal repair without mesh - Pros - safer, no mesh complications - Cons - not as strong as mesh repair, higher risk of recurrence  2) laparoscopic repair with mesh - Pros - stronger, better long-term success - Cons - risks of mesh implant (erosion into vagina or bladder, adhering to the rectum, pain) - these risks are lower than with a vaginal mesh but still exist - pt desires to proceed with exam under anesthesia, robotic assisted laparoscopic hysterectomy, bilateral salpingectomy, sacrocolpopexy, possible anterior/posterior colporrhaphy, perineoplasty, midurethral sling, and cystoscopy.  - discussed increased risk of pelvic pain and need for pelvic floor PT postop - discussed risk of conversion to vaginal procedure due to pelvic adhesions with history of endometriosis and BMI

## 2023-05-21 NOTE — Progress Notes (Signed)
[facility] Return Visit  SUBJECTIVE  History of Present Illness: [name] is a 42 y.o. female seen in follow-up for stage II pelvic organ prolapse, mixed urinary incontinence, constipation, . Plan at last visit was urodynamics and continue efforts for weight loss.   Insurance does not cover Trospium, Mirabegron, or Gemtesa despite symptomatic relief. Uses 2 panty liners/day (down from 3/day) denies any medications.  Day time voids 10.  Nocturia: 3-4 (baseline 4-6) times per night to void  Bowel movements 1-2x/day up from 2-3x/week with miralax Dyspareunia deep in pelvis resolved  Intermittent suprapubic 3/10 abdominal pain worsened with history of endometriosis and ectopic pregnancy. Ibuprofen with no relief Started PCP for weight loss with 9lb weight loss History of 6/10 low back pain radiating to right leg continues to bother patient Denies pending PT Takes flexeril 2-3x/year Stopped gabapentin since resolution of alcohol abuse Stopped Ativan 3-4 months ago  UDS 04/09/23 "Urodynamic Impression:  1. Sensation was increased; capacity was normal 2. Stress Incontinence was demonstrated at normal pressures; 3. Detrusor Overactivity was demonstrated without leakage. 4. Emptying was dysfunctional with a normal PVR, a sustained detrusor contraction not present,  abdominal straining present, dyssynergic urethral sphincter activity on EMG."  Past Medical History: Patient  has a past medical history of Anxiety, Arthritis, Depression, Ectopic pregnancy (12/29/2019), Endometriosis, Headache, tubal ligation (2008), Hypertension, and Infection.   Past Surgical History: She  has a past surgical history that includes Tubal ligation and Skin graft.   Medications: She has a current medication list which includes the following prescription(s): acetaminophen, albuterol, amlodipine, eq allergy relief (cetirizine), cyclobenzaprine, cyclobenzaprine, doxycycline, duloxetine, fluconazole,  fluticasone, ibuprofen, lorazepam, metoprolol succinate, naproxen, ondansetron, oxycodone, pantoprazole, polyethylene glycol powder, promethazine-dextromethorphan, semaglutide-weight management, tizanidine, and triamcinolone cream, and the following Facility-Administered Medications: levonorgestrel.   Allergies: Patient is allergic to penicillins, sulfa antibiotics, and azithromycin.   Social History: Patient  reports that she quit smoking about 3 years ago. Her smoking use included cigarettes. She started smoking about 16 years ago. She has a 13.6 pack-year smoking history. She has never used smokeless tobacco. She reports that she does not currently use alcohol. She reports that she does not use drugs.     OBJECTIVE     Physical Exam: Vitals:   05/21/23 1021  BP: (!) 140/80  Pulse: 76   Physical Exam Constitutional:      General: She is not in acute distress.    Appearance: Normal appearance.  Genitourinary:     Bladder and urethral meatus normal.     No lesions in the vagina.     Right Labia: No rash, tenderness, lesions, skin changes or Bartholin's cyst.    Left Labia: No tenderness, lesions, skin changes, Bartholin's cyst or rash.    No vaginal discharge, erythema, tenderness, bleeding, ulceration or granulation tissue.     Anterior, posterior and apical vaginal prolapse present.    No vaginal atrophy present.     Right Adnexa: not tender, not full and no mass present.    Left Adnexa: not tender, not full and no mass present.    No cervical motion tenderness, discharge, friability, lesion, polyp or nabothian cyst.     Uterus is prolapsed.     Uterus is not enlarged, fixed, tender or irregular.     No uterine mass detected.    Urethral meatus caruncle not present.    No urethral prolapse, tenderness, mass, hypermobility, discharge or stress urinary incontinence with cough stress test present.  Bladder is not tender, urgency on palpation not present and masses not  present.      Bladder exam comments: Discomfort with palpation of bladder.     Levator ani not tender, obturator internus not tender, no asymmetrical contractions present and no pelvic spasms present. Cardiovascular:     Rate and Rhythm: Normal rate.  Pulmonary:     Effort: Pulmonary effort is normal. No respiratory distress.  Abdominal:     General: There is no distension.     Palpations: Abdomen is soft. There is no mass.     Tenderness: There is no abdominal tenderness.     Hernia: No hernia is present.    Neurological:     Mental Status: She is alert.  Vitals reviewed. Exam conducted with a chaperone present.   POP-Q  0                                            Aa   0                                           Ba  -5                                              C   4                                            Gh  3                                            Pb  11                                            tvl   0                                            Ap  0                                            Bp  -7                                              D     ASSESSMENT AND PLAN    Ms. Asselin is a 42 y.o. with:  1. Mixed stress and urge urinary incontinence   2. Uterine prolapse   3. Constipation, unspecified constipation type   4. BMI  39.0-39.9,adult   5. Chronic midline low back pain with right-sided sciatica     Mixed stress and urge urinary incontinence Assessment & Plan: - urgency > stress - UDS with uroflow void, PVR . CMG showed increased sensation, normal capacity . SUI noted with DOI. VLPP 65cm H2O. Pressure flow Pdet at Qmax was 12.8 cm. PVR 40mL after catheter removed. Dyssynergic EMG with valsalva void. Discussed increased risk of prolonged urinary retention and catheterization based on UDS results, encouraged pt to consider pelvic floor PT, staged procedure or concomitant periurethral bulking. Patient expresses understanding,   however declines and desires to proceed with midurethral sling with an understanding of her increased risk. - Mirabegron and Gemtesa not covered by insurance, Trospium worsened constipation requiring enema and dulcolax - will avoid anti-cholinergic due to significant constipation - discussed possible need for additional treatment postop if refractory urinary leakage symptoms.   Uterine prolapse Assessment & Plan: - For treatment of pelvic organ prolapse, we discussed options for management including expectant management, conservative management, and surgical management, such as Kegels, a pessary, pelvic floor physical therapy, and specific surgical procedures. We discussed two options for prolapse repair:  1) vaginal repair without mesh - Pros - safer, no mesh complications - Cons - not as strong as mesh repair, higher risk of recurrence  2) laparoscopic repair with mesh - Pros - stronger, better long-term success - Cons - risks of mesh implant (erosion into vagina or bladder, adhering to the rectum, pain) - these risks are lower than with a vaginal mesh but still exist - pt desires to proceed with exam under anesthesia, robotic assisted laparoscopic hysterectomy, bilateral salpingectomy, sacrocolpopexy, possible anterior/posterior colporrhaphy, perineoplasty, midurethral sling, and cystoscopy.  - discussed increased risk of pelvic pain and need for pelvic floor PT postop - discussed risk of conversion to vaginal procedure due to pelvic adhesions with history of endometriosis and BMI  Orders: -     Polyethylene Glycol 3350; Take 17 g by mouth daily. Drink 17g (1 scoop) dissolved in water per day.  Dispense: 255 g; Refill: 0 -     oxyCODONE HCl; Take 1 tablet (5 mg total) by mouth every 4 (four) hours as needed for severe pain (pain score 7-10).  Dispense: 7 tablet; Refill: 0 -     Acetaminophen; Take 1 tablet (500 mg total) by mouth every 6 (six) hours as needed (pain).  Dispense: 30 tablet;  Refill: 0  Constipation, unspecified constipation type Assessment & Plan: - s/p dulcolax and enema - stopped trospium - reduced suprapubic discomfort since start miralax, encouraged to continue titration and possible need to increase postop  - discussed need to optimize stool consistency to reduce straining and POP recurrence  - For constipation, we reviewed the importance of a better bowel regimen.  We also discussed the importance of avoiding chronic straining, as it can exacerbate her pelvic floor symptoms; we discussed treating constipation and straining prior to surgery, as postoperative straining can lead to damage to the repair and recurrence of symptoms. We discussed initiating therapy with increasing fluid intake, fiber supplementation, stool softeners, and laxatives such as miralax.    BMI 39.0-39.9,adult Assessment & Plan: - weight gain since discontinuation of alcohol use - consistent weight loss since 2023 per chart review - encouraged pt to continue diet modification and active lifestyle - started semaglutide with 9lb weight loss, continue - discussed increased risk of perioperative complications with elevated BMI   Chronic midline low back pain with right-sided sciatica Assessment &  Plan: - managed by PCP - Discussed positioning awake due to midline back pain, discussed possible exacerbation of pain postop and discussed need to monitor back pain due to SCP. - encouraged sleeping with pillow between her legs due to reproducible pubic symphysis pain   Plan for surgery: Exam under anesthesia,  exam under anesthesia, robotic assisted laparoscopic hysterectomy, bilateral salpingectomy, sacrocolpopexy, possible anterior/posterior colporrhaphy, perineoplasty, midurethral sling, and cystoscopy  - We reviewed the patient's specific anatomic and functional findings, with the assistance of diagrams, and together finalized the above procedure. The planned surgical procedures were  discussed along with the surgical risks outlined below, which were also provided on a detailed handout. Additional treatment options including expectant management, conservative management, medical management were discussed where appropriate.  We reviewed the benefits and risks of each treatment option.   General Surgical Risks: For all procedures, there are risks of bleeding, infection, damage to surrounding organs including but not limited to bowel, bladder, blood vessels, ureters and nerves, and need for further surgery if an injury were to occur. These risks are all low with minimally invasive surgery.   There are risks of numbness and weakness at any body site or buttock/rectal pain.  It is possible that baseline pain can be worsened by surgery, either with or without mesh. If surgery is vaginal, there is also a low risk of possible conversion to laparoscopy or open abdominal incision where indicated. Very rare risks include blood transfusion, blood clot, heart attack, pneumonia, or death.   There is also a risk of short-term postoperative urinary retention with need to use a catheter. About half of patients need to go home from surgery with a catheter, which is then later removed in the office. The risk of long-term need for a catheter is very low. There is also a risk of worsening of overactive bladder.   Sling: The effectiveness of a midurethral vaginal mesh sling is approximately 85%, and thus, there will be times when you may leak urine after surgery, especially if your bladder is full or if you have a strong cough. There is a balance between making the sling tight enough to treat your leakage but not too tight so that you have long-term difficulty emptying your bladder. A mesh sling will not directly treat overactive bladder/urge incontinence and may worsen it.  There is an FDA safety notification on vaginal mesh procedures for prolapse but NOT mesh slings. We have extensive experience and  training with mesh placement and we have close postoperative follow up to identify any potential complications from mesh. It is important to realize that this mesh is a permanent implant that cannot be easily removed. There are rare risks of mesh exposure (2-4%), pain with intercourse (0-7%), and infection (<1%). The risk of mesh exposure if more likely in a woman with risks for poor healing (prior radiation, poorly controlled diabetes, or immunocompromised). The risk of new or worsened chronic pain after mesh implant is more common in women with baseline chronic pain and/or poorly controlled anxiety or depression. Approximately 2-4% of patients will experience longer-term post-operative voiding dysfunction that may require surgical revision of the sling. We also reviewed that postoperatively, her stream may not be as strong as before surgery.   Prolapse (with or without mesh): Risk factors for surgical failure  include things that put pressure on your pelvis and the surgical repair, including obesity, chronic cough, and heavy lifting or straining (including lifting children or adults, straining on the toilet, or lifting heavy objects  such as furniture or anything weighing >25 lbs. Risks of recurrence is 20-30% with vaginal native tissue repair and a less than 10% with sacrocolpopexy with mesh.    Sacrocolpopexy: Mesh implants may provide more prolapse support, but do have some unique risks to consider. It is important to understand that mesh is permanent and cannot be easily removed. Risks of abdominal sacrocolpopexy mesh include mesh exposure (~3-6%), painful intercourse (recent studies show lower rates after surgery compared to before, with ~5-8% risk of new onset), and very rare risks of bowel or bladder injury or infection (<1%). The risk of mesh exposure is more likely in a woman with risks for poor healing (prior radiation, poorly controlled diabetes, or immunocompromised). The risk of new or worsened  chronic pain after mesh implant is more common in women with baseline chronic pain and/or poorly controlled anxiety or depression. There is an FDA safety notification on vaginal mesh procedures for prolapse but NOT abdominal mesh procedures and therefore does not apply to your surgery. We have extensive experience and training with mesh placement and we have close postoperative follow up to identify any potential complications from mesh.    - For preop Visit:  She is required to have a visit within 30 days of her surgery.   Today we reviewed pre-operative preparation, peri-operative expectations, and post-operative instructions/recovery.  She was provided with instructional handouts. She understands not to take aspirin (>81mg ) or NSAIDs 7 days prior to surgery. Prescriptions provided for: Oxycodone 5mg , Ibuprofen 600mg , Tylenol 500mg , Miralax. These prescriptions will be sent prior to surgery.  - Medical clearance: not required  - Anticoagulant use: No Caprini score 3 - Medicaid Hysterectomy form: Yes - Accepts blood transfusion: Yes - Expected length of stay: outpatient  Surgery scheduled on 06/06/23.   Loleta Chance, MD

## 2023-05-21 NOTE — Assessment & Plan Note (Signed)
-   managed by PCP - Discussed positioning awake due to midline back pain, discussed possible exacerbation of pain postop and discussed need to monitor back pain due to SCP. - encouraged sleeping with pillow between her legs due to reproducible pubic symphysis pain

## 2023-05-21 NOTE — Assessment & Plan Note (Signed)
-   weight gain since discontinuation of alcohol use - consistent weight loss since 2023 per chart review - encouraged pt to continue diet modification and active lifestyle - started semaglutide with 9lb weight loss, continue - discussed increased risk of perioperative complications with elevated BMI

## 2023-05-21 NOTE — Patient Instructions (Addendum)
Sleep with pillow between your legs.   Continue titration of miralax to maintain bowel consistency.   Continue medication for weight loss. Congrats!  Sling: The effectiveness of a midurethral vaginal mesh sling is approximately 85%, and thus, there will be times when you may leak urine after surgery, especially if your bladder is full or if you have a strong cough. There is a balance between making the sling tight enough to treat your leakage but not too tight so that you have long-term difficulty emptying your bladder. A mesh sling will not directly treat overactive bladder/urge incontinence and may worsen it.  There is an FDA safety notification on vaginal mesh procedures for prolapse but NOT mesh slings. We have extensive experience and training with mesh placement and we have close postoperative follow up to identify any potential complications from mesh. It is important to realize that this mesh is a permanent implant that cannot be easily removed. There are rare risks of mesh exposure (2-4%), pain with intercourse (0-7%), and infection (<1%). The risk of mesh exposure if more likely in a woman with risks for poor healing (prior radiation, poorly controlled diabetes, or immunocompromised). The risk of new or worsened chronic pain after mesh implant is more common in women with baseline chronic pain and/or poorly controlled anxiety or depression. Approximately 2-4% of patients will experience longer-term post-operative voiding dysfunction that may require surgical revision of the sling. We also reviewed that postoperatively, her stream may not be as strong as before surgery.   Sacrocolpopexy: Mesh implants may provide more prolapse support, but do have some unique risks to consider. It is important to understand that mesh is permanent and cannot be easily removed. Risks of abdominal sacrocolpopexy mesh include mesh exposure (~3-6%), painful intercourse (recent studies show lower rates after surgery  compared to before, with ~5-8% risk of new onset), and very rare risks of bowel or bladder injury or infection (<1%). The risk of mesh exposure is more likely in a woman with risks for poor healing (prior radiation, poorly controlled diabetes, or immunocompromised). The risk of new or worsened chronic pain after mesh implant is more common in women with baseline chronic pain and/or poorly controlled anxiety or depression. There is an FDA safety notification on vaginal mesh procedures for prolapse but NOT abdominal mesh procedures and therefore does not apply to your surgery. We have extensive experience and training with mesh placement and we have close postoperative follow up to identify any potential complications from mesh.

## 2023-05-22 ENCOUNTER — Encounter (HOSPITAL_BASED_OUTPATIENT_CLINIC_OR_DEPARTMENT_OTHER): Payer: Self-pay | Admitting: Obstetrics

## 2023-05-23 ENCOUNTER — Encounter (HOSPITAL_BASED_OUTPATIENT_CLINIC_OR_DEPARTMENT_OTHER): Payer: Self-pay | Admitting: Obstetrics

## 2023-05-23 NOTE — Progress Notes (Addendum)
Your procedure is scheduled on :  Friday,  06-06-2023  Report to [facility] AT  _5:30__ AM.   Call this number if you have problems the morning of surgery  :[phone]. Any questions prior to surgery call pre-op nurse, Ajene Carchi :  [phone]   OUR ADDRESS IS [address].  WE ARE LOCATED IN THE [facility]  PLEASE BRING YOUR INSURANCE CARD AND PHOTO ID DAY OF SURGERY.                                     REMEMBER:  Do not eat food after midnight night before surgery.  You may have clear liquids from midnight night before surgery until 4:30 AM.  NO clear liquids after 4:30 AM.  This includes no water,  candy, gum,  mints.    Please brush your teeth morning of surgery and rinse mouth out.   CLEAR LIQUID DIET Allowed      Water                                                                   Coffee and tea, regular and decaf  (NO cream or milk products of any type, may sweeten, no honey)                         Carbonated beverages, regular and diet                                    Sports drinks like Gatorade _____________________________________________________________________     TAKE ONLY THESE MEDICATIONS MORNING OF SURGERY: If taking after 4:30 AM may take with sips of water Cetirizine  (zyrtec) Metoprolol (toprol) Amlodipine (norvasc) Pantoprazole (protonix) Duloxetine (cymbalta)                                        DO NOT WEAR JEWERLY/  METAL/  PIERCINGS (INCLUDING NO PLASTIC PIERCINGS) DO NOT WEAR LOTIONS, POWDERS, PERFUMES OR NAIL POLISH ON YOUR FINGERNAILS. TOENAIL POLISH IS OK TO WEAR. DO NOT SHAVE FOR 48 HOURS PRIOR TO DAY OF SURGERY.  CONTACTS, GLASSES, OR DENTURES MAY NOT BE WORN TO SURGERY.  REMEMBER: NO SMOKING, VAPING ,  DRUGS OR ALCOHOL FOR 24 HOURS BEFORE YOUR SURGERY.                                    [facility] IS NOT RESPONSIBLE  FOR ANY BELONGINGS.                                                                     Marland Kitchen  [place] [place] - Preparing for Surgery Before surgery, you can play an important role.  Because skin is not sterile, your skin needs to be as free of germs as possible.  You can reduce the number of germs on your skin by washing with CHG (chlorahexidine gluconate) soap before surgery.  CHG is an antiseptic cleaner which kills germs and bonds with the skin to continue killing germs even after washing. Please DO NOT use if you have an allergy to CHG or antibacterial soaps.  If your skin becomes reddened/irritated stop using the CHG and inform your nurse when you arrive at Short Stay. Do not shave (including legs and underarms) for at least 48 hours prior to the first CHG shower.  You may shave your face/neck. Please follow these instructions carefully:  1.  Shower with CHG Soap the night before surgery and the  morning of Surgery.  2.  If you choose to wash your hair, wash your hair first as usual with your  normal  shampoo.  3.  After you shampoo, rinse your hair and body thoroughly to remove the  shampoo.                                        4.  Use CHG as you would any other liquid soap.  You can apply chg directly  to the skin and wash , chg soap provided, night before and morning of your surgery.  5.  Apply the CHG Soap to your body ONLY FROM THE NECK DOWN.   Do not use on face/ open                           Wound or open sores. Avoid contact with eyes, ears mouth and genitals (private parts).                       Wash face,  Genitals (private parts) with your normal soap.             6.  Wash thoroughly, paying special attention to the area where your surgery  will be performed.  7.  Thoroughly rinse your body with warm water from the neck down.  8.  DO NOT shower/wash with your normal soap after using and rinsing off  the CHG Soap.             9.  Pat yourself dry with a clean towel.            10.  Wear clean pajamas.            11.  Place clean sheets on  your bed the night of your first shower and do not  sleep with pets. Day of Surgery : Do not apply any lotions/ powders the morning of surgery.  Please wear clean clothes to the hospital/surgery center.  IF YOU HAVE ANY SKIN IRRITATION OR PROBLEMS WITH THE SURGICAL SOAP, PLEASE GET A BAR OF GOLD DIAL SOAP AND SHOWER THE NIGHT BEFORE YOUR SURGERY AND THE MORNING OF YOUR SURGERY. PLEASE LET THE NURSE KNOW MORNING OF YOUR SURGERY IF YOU HAD ANY PROBLEMS WITH THE SURGICAL SOAP.   YOUR SURGEON MAY HAVE REQUESTED EXTENDED RECOVERY TIME AFTER YOUR SURGERY. IT COULD BE A  JUST A FEW HOURS  UP TO AN OVERNIGHT STAY.  YOUR SURGEON SHOULD HAVE DISCUSSED  THIS WITH YOU PRIOR TO YOUR SURGERY. IN THE EVENT YOU NEED TO STAY OVERNIGHT PLEASE REFER TO THE FOLLOWING GUIDELINES. YOU MAY HAVE UP TO 4 VISITORS  MAY VISIT IN THE EXTENDED RECOVERY ROOM UNTIL 800 PM ONLY.  ONE  VISITOR AGE 48 AND OVER MAY SPEND THE NIGHT AND MUST BE IN EXTENDED RECOVERY ROOM NO LATER THAN 800 PM . YOUR DISCHARGE TIME AFTER YOU SPEND THE NIGHT IS 900 AM THE MORNING AFTER YOUR SURGERY. YOU MAY PACK A SMALL OVERNIGHT BAG WITH TOILETRIES FOR YOUR OVERNIGHT STAY IF YOU WISH.  REGARDLESS OF IF YOU STAY OVER NIGHT OR ARE DISCHARGED THE SAME DAY YOU WILL BE REQUIRED TO HAVE A RESPONSIBLE ADULT (18 YRS OLD OR OLDER) STAY WITH YOU FOR AT LEAST THE FIRST 24 HOURS WHEN HOME  YOUR PRESCRIPTION MEDICATIONS WILL BE PROVIDED DURING [facility] STAY.  ________________________________________________________________________

## 2023-05-23 NOTE — Progress Notes (Signed)
Spoke w/ via phone for pre-op interview--- pt Lab needs dos---- urine preg        Lab results------ lab appt 05-30-2023 @  0900 getting CBC/ BMP/ T&S/ EKG COVID test -----patient states asymptomatic no test needed Arrive at ------- 0530 on 06-06-2023 NPO after MN NO Solid Food.  Clear liquids from MN until--- 0430 Med rec completed Medications to take morning of surgery ----- zyrtec, metoprolol, protonix, cymbalta Diabetic medication ----- pt stated was not given instructions about stopping semaglutide, last dose 05-20-2023, pt verbalized understanding not to do until after surgery Patient instructed no nail polish to be worn day of surgery Patient instructed to bring photo id and insurance card day of surgery Patient aware to have Driver (ride ) / caregiver    for 24 hours after surgery - father, bruce Patient Special Instructions ----- will pick up bag w/ hibiclens and written instructions at lab appt Pre-Op special Instructions -----  n/a Patient verbalized understanding of instructions that were given at this phone interview. Patient denies chest pain, sob, fever, cough at the interview.

## 2023-05-28 ENCOUNTER — Encounter: Payer: Self-pay | Admitting: Obstetrics

## 2023-05-30 ENCOUNTER — Encounter (HOSPITAL_COMMUNITY)
Admission: RE | Admit: 2023-05-30 | Discharge: 2023-05-30 | Disposition: A | Discharge status: Home / Self Care | Payer: 59 | Source: Ambulatory Visit | Attending: Obstetrics | Admitting: Obstetrics

## 2023-05-30 DIAGNOSIS — N3946 Mixed incontinence: Secondary | ICD-10-CM | POA: Diagnosis not present

## 2023-05-30 DIAGNOSIS — Z01818 Encounter for other preprocedural examination: Secondary | ICD-10-CM | POA: Diagnosis not present

## 2023-05-30 DIAGNOSIS — Z01810 Encounter for preprocedural cardiovascular examination: Secondary | ICD-10-CM | POA: Diagnosis present

## 2023-05-30 DIAGNOSIS — N814 Uterovaginal prolapse, unspecified: Secondary | ICD-10-CM | POA: Diagnosis not present

## 2023-05-30 DIAGNOSIS — Z01812 Encounter for preprocedural laboratory examination: Secondary | ICD-10-CM | POA: Diagnosis present

## 2023-05-30 LAB — BASIC METABOLIC PANEL
Anion gap: 7 (ref 5–15)
BUN: 11 mg/dL (ref 6–20)
CO2: 23 mmol/L (ref 22–32)
Calcium: 8.9 mg/dL (ref 8.9–10.3)
Chloride: 107 mmol/L (ref 98–111)
Creatinine, Ser: 0.78 mg/dL (ref 0.44–1.00)
GFR, Estimated: >60 mL/min (ref >60)
Glucose, Bld: 98 mg/dL (ref 70–99)
Potassium: 4.1 mmol/L (ref 3.5–5.1)
Sodium: 137 mmol/L (ref 135–145)

## 2023-05-30 LAB — CBC
HCT: 42.7 % (ref 36.0–46.0)
Hemoglobin: 13.9 g/dL (ref 12.0–15.0)
MCH: 28.9 pg (ref 26.0–34.0)
MCHC: 32.6 g/dL (ref 30.0–36.0)
MCV: 88.8 fL (ref 80.0–100.0)
Platelets: 279 10*3/uL (ref 150–400)
RBC: 4.81 MIL/uL (ref 3.87–5.11)
RDW: 13.2 % (ref 11.5–15.5)
WBC: 7.4 10*3/uL (ref 4.0–10.5)
nRBC: 0 % (ref 0.0–0.2)

## 2023-06-02 ENCOUNTER — Telehealth: Payer: Self-pay | Admitting: Obstetrics

## 2023-06-02 MED ORDER — METRONIDAZOLE 500 MG PO TABS: 500.0000 mg | ORAL_TABLET | Freq: Two times a day (BID) | ORAL | 0 refills | Status: AC

## 2023-06-02 NOTE — Telephone Encounter (Signed)
Patient called regarding concerns of blood tinged vaginal discharge for 1 day that resolved.  Has IUD in place, attributed to possible spotting.  Reports some vaginal irritation and concerns of postponement of surgery if untreated infection.  Unable to present to office for evaluation due to work tomorrow. Denies UTI symptoms of dysuria, hematuria.  Rx flagyl to start for possible BV due to vaginal irritation.  Patient to call if she has persistent or worsening symptoms prior to surgery such as fever, chills, nausea/vomiting, or unilateral back pain.

## 2023-06-04 DIAGNOSIS — Z419 Encounter for procedure for purposes other than remedying health state, unspecified: Secondary | ICD-10-CM | POA: Diagnosis not present

## 2023-06-05 ENCOUNTER — Encounter: Payer: Self-pay | Admitting: Obstetrics

## 2023-06-05 ENCOUNTER — Encounter: Payer: Self-pay | Admitting: Obstetrics and Gynecology

## 2023-06-05 MED ORDER — CLINDAMYCIN PHOSPHATE 900 MG/50ML IV SOLN: 900.0000 mg | INTRAVENOUS | Status: DC

## 2023-06-05 MED ORDER — GENTAMICIN SULFATE 40 MG/ML IJ SOLN
5.0000 mg/kg | INTRAVENOUS | Status: DC
  Filled 2023-06-05 (×2): qty 8.75

## 2023-06-06 ENCOUNTER — Ambulatory Visit (HOSPITAL_BASED_OUTPATIENT_CLINIC_OR_DEPARTMENT_OTHER): Admission: RE | Admit: 2023-06-06 | Payer: 59 | Source: Home / Self Care | Admitting: Obstetrics

## 2023-06-06 DIAGNOSIS — N3946 Mixed incontinence: Secondary | ICD-10-CM

## 2023-06-06 DIAGNOSIS — N814 Uterovaginal prolapse, unspecified: Secondary | ICD-10-CM

## 2023-06-06 DIAGNOSIS — Z01818 Encounter for other preprocedural examination: Secondary | ICD-10-CM

## 2023-06-06 HISTORY — DX: Alcohol dependence, in remission: F10.21

## 2023-06-06 HISTORY — DX: Generalized anxiety disorder: F41.1

## 2023-06-06 HISTORY — DX: Mixed incontinence: N39.46

## 2023-06-06 HISTORY — DX: Presence of spectacles and contact lenses: Z97.3

## 2023-06-06 HISTORY — DX: Female genital prolapse, unspecified: N81.9

## 2023-06-06 HISTORY — DX: Other chronic pain: G89.29

## 2023-06-06 HISTORY — DX: Major depressive disorder, single episode, unspecified: F32.9

## 2023-06-06 HISTORY — DX: Prediabetes: R73.03

## 2023-06-06 HISTORY — DX: Personal history of other complications of pregnancy, childbirth and the puerperium: Z87.59

## 2023-06-06 HISTORY — DX: Gastro-esophageal reflux disease without esophagitis: K21.9

## 2023-06-06 LAB — TYPE AND SCREEN
ABO/RH(D): O POS
Antibody Screen: NEGATIVE

## 2023-06-06 SURGERY — HYSTERECTOMY, TOTAL, ROBOT-ASSISTED, WITH SACROCOLPOPEXY
Anesthesia: General

## 2023-06-09 ENCOUNTER — Ambulatory Visit: Payer: Medicaid Other | Admitting: Family Medicine

## 2023-06-18 NOTE — Telephone Encounter (Signed)
 Pt was contacted and notified that she will be contacted to have her surgery scheduled.

## 2023-07-02 ENCOUNTER — Other Ambulatory Visit: Payer: Self-pay | Admitting: *Deleted

## 2023-07-02 DIAGNOSIS — N813 Complete uterovaginal prolapse: Secondary | ICD-10-CM

## 2023-07-02 DIAGNOSIS — N393 Stress incontinence (female) (male): Secondary | ICD-10-CM

## 2023-07-02 NOTE — Progress Notes (Signed)
Surgery scheduled 09/01/23 preop 3/4 1100 with KZ post op scheduled 5/12 1000

## 2023-07-04 ENCOUNTER — Ambulatory Visit: Payer: 59 | Admitting: Family Medicine

## 2023-07-05 DIAGNOSIS — Z419 Encounter for procedure for purposes other than remedying health state, unspecified: Secondary | ICD-10-CM | POA: Diagnosis not present

## 2023-07-16 ENCOUNTER — Ambulatory Visit: Payer: 59 | Admitting: Family Medicine

## 2023-07-17 ENCOUNTER — Encounter: Payer: 59 | Admitting: Obstetrics

## 2023-07-18 ENCOUNTER — Encounter: Payer: 59 | Admitting: Obstetrics

## 2023-07-24 ENCOUNTER — Telehealth: Payer: 59 | Admitting: Physician Assistant

## 2023-07-24 DIAGNOSIS — J208 Acute bronchitis due to other specified organisms: Secondary | ICD-10-CM | POA: Diagnosis not present

## 2023-07-24 MED ORDER — ALBUTEROL SULFATE HFA 108 (90 BASE) MCG/ACT IN AERS
2.0000 | INHALATION_SPRAY | Freq: Four times a day (QID) | RESPIRATORY_TRACT | 0 refills | Status: AC | PRN
Start: 2023-07-24 — End: ?

## 2023-07-24 MED ORDER — BENZONATATE 100 MG PO CAPS: 100.0000 mg | ORAL_CAPSULE | Freq: Three times a day (TID) | ORAL | 0 refills | Status: DC | PRN

## 2023-07-24 NOTE — Progress Notes (Signed)
E-Visit for Cough   We are sorry that you are not feeling well.  Here is how we plan to help!  Based on your presentation I believe you most likely have A cough due to a virus.  This is called viral bronchitis and is best treated by rest, plenty of fluids and control of the cough.  You may use Ibuprofen or Tylenol as directed to help your symptoms.     In addition you may use A prescription cough medication called Tessalon Perles 100mg . You may take 1-2 capsules every 8 hours as needed for your cough. I have also sent in an albuterol inhaler to use as directed.  From your responses in the eVisit questionnaire you describe inflammation in the upper respiratory tract which is causing a significant cough.  This is commonly called Bronchitis and has four common causes:   Allergies Viral Infections Acid Reflux Bacterial Infection Allergies, viruses and acid reflux are treated by controlling symptoms or eliminating the cause. An example might be a cough caused by taking certain blood pressure medications. You stop the cough by changing the medication. Another example might be a cough caused by acid reflux. Controlling the reflux helps control the cough.  USE OF BRONCHODILATOR ("RESCUE") INHALERS: There is a risk from using your bronchodilator too frequently.  The risk is that over-reliance on a medication which only relaxes the muscles surrounding the breathing tubes can reduce the effectiveness of medications prescribed to reduce swelling and congestion of the tubes themselves.  Although you feel brief relief from the bronchodilator inhaler, your asthma may actually be worsening with the tubes becoming more swollen and filled with mucus.  This can delay other crucial treatments, such as oral steroid medications. If you need to use a bronchodilator inhaler daily, several times per day, you should discuss this with your provider.  There are probably better treatments that could be used to keep your asthma  under control.     HOME CARE Only take medications as instructed by your medical team. Complete the entire course of an antibiotic. Drink plenty of fluids and get plenty of rest. Avoid close contacts especially the very young and the elderly Cover your mouth if you cough or cough into your sleeve. Always remember to wash your hands A steam or ultrasonic humidifier can help congestion.   GET HELP RIGHT AWAY IF: You develop worsening fever. You become short of breath You cough up blood. Your symptoms persist after you have completed your treatment plan MAKE SURE YOU  Understand these instructions. Will watch your condition. Will get help right away if you are not doing well or get worse.    Thank you for choosing an e-visit.  Your e-visit answers were reviewed by a board certified advanced clinical practitioner to complete your personal care plan. Depending upon the condition, your plan could have included both over the counter or prescription medications.  Please review your pharmacy choice. Make sure the pharmacy is open so you can pick up prescription now. If there is a problem, you may contact your provider through [employer] and have the prescription routed to another pharmacy.  Your safety is important to [place]. If you have drug allergies check your prescription carefully.   For the next 24 hours you can use MyChart to ask questions about today's visit, request a non-urgent call back, or ask for a work or school excuse. You will get an email in the next two days asking about your experience. I hope  that your e-visit has been valuable and will speed your recovery.

## 2023-07-24 NOTE — Progress Notes (Signed)
 I have spent 5 minutes in review of e-visit questionnaire, review and updating patient chart, medical decision making and response to patient.   Piedad Climes, PA-C

## 2023-08-02 DIAGNOSIS — Z419 Encounter for procedure for purposes other than remedying health state, unspecified: Secondary | ICD-10-CM | POA: Diagnosis not present

## 2023-08-05 ENCOUNTER — Encounter: Payer: 59 | Admitting: Obstetrics and Gynecology

## 2023-08-06 ENCOUNTER — Other Ambulatory Visit: Payer: Self-pay | Admitting: Obstetrics and Gynecology

## 2023-08-06 ENCOUNTER — Encounter: Payer: Self-pay | Admitting: Obstetrics and Gynecology

## 2023-08-06 ENCOUNTER — Other Ambulatory Visit (HOSPITAL_COMMUNITY)
Admission: RE | Admit: 2023-08-06 | Discharge: 2023-08-06 | Disposition: A | Discharge status: Home / Self Care | Source: Ambulatory Visit | Attending: Obstetrics and Gynecology | Admitting: Obstetrics and Gynecology

## 2023-08-06 ENCOUNTER — Ambulatory Visit (INDEPENDENT_AMBULATORY_CARE_PROVIDER_SITE_OTHER): Payer: 59 | Admitting: Obstetrics and Gynecology

## 2023-08-06 VITALS — BP 113/78 | HR 85 | Wt 232.0 lb

## 2023-08-06 DIAGNOSIS — N898 Other specified noninflammatory disorders of vagina: Secondary | ICD-10-CM | POA: Diagnosis present

## 2023-08-06 DIAGNOSIS — Z01818 Encounter for other preprocedural examination: Secondary | ICD-10-CM | POA: Diagnosis not present

## 2023-08-06 DIAGNOSIS — N816 Rectocele: Secondary | ICD-10-CM

## 2023-08-06 DIAGNOSIS — R102 Pelvic and perineal pain: Secondary | ICD-10-CM

## 2023-08-06 DIAGNOSIS — N8110 Cystocele, unspecified: Secondary | ICD-10-CM

## 2023-08-06 DIAGNOSIS — N393 Stress incontinence (female) (male): Secondary | ICD-10-CM

## 2023-08-06 MED ORDER — METRONIDAZOLE 500 MG PO TABS: 500.0000 mg | ORAL_TABLET | Freq: Two times a day (BID) | ORAL | 0 refills | Status: AC
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
## 2023-08-06 NOTE — Progress Notes (Signed)
 Gabapentin ordered for surgery
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
## 2023-08-07 LAB — CERVICOVAGINAL ANCILLARY ONLY
Bacterial Vaginitis (gardnerella): NEGATIVE
Candida Glabrata: NEGATIVE
Candida Vaginitis: NEGATIVE
Comment: NEGATIVE
Comment: NEGATIVE
Comment: NEGATIVE

## 2023-08-08 ENCOUNTER — Encounter: Payer: Self-pay | Admitting: Obstetrics and Gynecology

## 2023-08-11 ENCOUNTER — Telehealth: Payer: Self-pay | Admitting: Obstetrics

## 2023-08-11 NOTE — Telephone Encounter (Signed)
 Reviewed updated date of procedure, previously cancelled due to URI.  Discussed weight gain since last preop visit and increased perioperative risks due to BMI > 40.  Reviewed possible sacrocolpopexy vs. uterosacral ligament suspension dependent upon intra-abdominal visualization. Patient expresses understanding and desires to proceed with SCP and will undergo USLS if unable to proceed with SCP.

## 2023-08-17 ENCOUNTER — Encounter

## 2023-08-21 ENCOUNTER — Encounter (HOSPITAL_COMMUNITY): Payer: Self-pay | Admitting: Obstetrics

## 2023-08-27 ENCOUNTER — Encounter (HOSPITAL_COMMUNITY): Payer: Self-pay | Admitting: Obstetrics

## 2023-08-27 NOTE — Progress Notes (Signed)
 Spoke w/ via phone for pre-op interview--- pt Lab needs dos----   urine preg      Lab results------ lab appt 08-29-2023 getting CBC/ BMP/ T&S COVID test -----patient states asymptomatic no test needed Arrive at ------- 0530 on 09-01-2023 NPO after MN NO Solid Food.  Clear liquids from MN until--- 0430 Pre-Surgery Ensure or G2: n/a ERAS protocol:  yes  Med rec completed Medications to take morning of surgery ----- cymbalta, toprol, norvasc, protonix Diabetic medication ----- n/a  GLP1 agonist last dose:  pt stated last dose 05-20-2023,  never started back since previously rescheduled from 06-06-2023.  GLP1 instructions:  Patient instructed no nail polish to be worn day of surgery Patient instructed to bring photo id and insurance card day of surgery Patient aware to have Driver (ride ) / caregiver    for 24 hours after surgery - father, and two daughters.  Reviewed w/ patient since one of her daughter is age 77 and she has two adults this should be okay in the waiting area Patient Special Instructions ----- will pick up bag w/ CHG and written instructions at lab appt Pre-Op special Instructions -----  n/a  Patient verbalized understanding of instructions that were given at this phone interview. Patient denies chest pain, sob, fever, cough at the interview.    Anesthesia Review:  HTN;  pt has had cardiology work-up done for atypical chest pain since 2018 last stress echo 08-27-2021  normal w/ no ischemia ;   hx alcoholic in remission 3 yrs;  pre-diabetic;  gerd Pt denies cardiac s&s,  no sob, and no peripheral swelling.  PCP:   Dr A. Nadine Counts Theron Arista 12-27-2022) Cardiologist :  last cardiology visit for evaluation Dr Izora Ribas 08-02-2021  Chest x-ray : 04-19-2022 epic/   06-27-2022  [facility] EKG :  05-30-2023 Echo : 05-29-2017 Stress test:   stress echo 08-27-2021 Cardiac Cath :  no  Activity level:  denies sob w/ any acticity Sleep Study/ CPAP :  no Fasting Blood  Sugar   / Checks Blood Sugar -- times a day:  does not check  Blood Thinner/ Instructions /Last Dose: no ASA / Instructions/ Last Dose : no

## 2023-08-27 NOTE — Pre-Procedure Instructions (Signed)
 Surgical Instructions   Your procedure is scheduled on :  Monday,  09-01-2023. Report to [facility] Main Entrance "A" at 5:30 A.M., then check in with the Admitting office. Any questions or running late day of surgery: call [phone]  Questions prior to your surgery date: call [phone], Monday-Friday, 8am-4pm. If you experience any cold or flu symptoms such as cough, fever, chills, shortness of breath, etc. between now and your scheduled surgery, please notify your surgeon office.    Remember:  Do not eat any food after midnight the night before your surgery.  You may have clear liquids from midnight night before surgery until 4:30 AM.   You may drink clear liquids until 4:30 AM the morning of your surgery.   Clear liquids allowed are:  Water Carbonated Beverages (low sugar, diet)  Clear Tea (may use non-sweetener no milk, honey, etc.) Black Coffee Only (may use non-sweetener, NO MILK, CREAM OR POWDERED CREAMER of any kind) NO clear liquids after 4:30 AM day of surgery.  This includes no water , candy,  gum,  and  mints.    Take these medicines the morning of surgery with A SIPS OF WATER : Metoprolol (toprol) Amlodipine (norvasc) Pantoprazole (protonix) Duloxetine (cymbalta)   May take these medicines IF NEEDED: Cetirizine (zyrtec) Hydroxyzine (atarax) Lorazepam (ativan)     One week prior to surgery, STOP taking any Aspirin (unless otherwise instructed by your surgeon) Aleve, Naproxen, Ibuprofen, Motrin, Advil, Goody's, BC's, all herbal medications, fish oil, and non-prescription vitamins.                     Do NOT Smoke (Tobacco/Vaping) for 24 hours prior to your procedure.  If you use a CPAP at night, you may bring your mask/headgear for your overnight stay.   You will be asked to remove any contacts, glasses, piercing's, hearing aid's, dentures/partials prior to surgery. Please bring cases for these items if needed.    Patients discharged the day of surgery  will not be allowed to drive home, and someone needs to stay with them for 24 hours.  SURGICAL WAITING ROOM VISITATION Patients may have no more than 2 support people in the waiting area - these visitors may rotate.   Pre-op nurse will coordinate an appropriate time for 1 ADULT support person, who may not rotate, to accompany patient in pre-op.  Children under the age of 26 must have an adult with them who is not the patient and must remain in the main waiting area with an adult.  If the patient needs to stay at the hospital during part of their recovery, the visitor guidelines for inpatient rooms apply.  Please refer to the [facility] website for the visitor guidelines for any additional information.   If you received a COVID test during your pre-op visit  it is requested that you wear a mask when out in public, stay away from anyone that may not be feeling well and notify your surgeon if you develop symptoms. If you have been in contact with anyone that has tested positive in the last 10 days please notify you surgeon.      Pre-operative CHG Bathing Instructions   You can play a key role in reducing the risk of infection after surgery. Your skin needs to be as free of germs as possible. You can reduce the number of germs on your skin by washing with CHG (chlorhexidine gluconate) soap before surgery. CHG is an antiseptic soap that kills germs and  continues to kill germs even after washing.   DO NOT use if you have an allergy to chlorhexidine/CHG or antibacterial soaps. If your skin becomes reddened or irritated, stop using the CHG and notify Pre-Op day of surgery.  If you have any skin irritation or problems with the surgical soap (CHG), do not use.  Please get a bar of dial soap or any antibacterial soap and shower following the instructions below.             TAKE A SHOWER THE NIGHT BEFORE SURGERY AND THE DAY OF SURGERY    Please keep in mind the following:  DO NOT shave, including  legs and underarms, 48 hours prior to surgery.   You may shave your face before/day of surgery.  Place clean sheets on your bed the night before surgery Use a clean washcloth (not used since being washed) for each shower. DO NOT sleep with pet's night before surgery.  CHG Shower Instructions:  Wash your face and private area with normal soap. If you choose to wash your hair, wash first with your normal shampoo.  After you use shampoo/soap, rinse your hair and body thoroughly to remove shampoo/soap residue.  Turn the water OFF and apply half the bottle of CHG soap to a CLEAN washcloth.  Apply CHG soap ONLY FROM YOUR NECK DOWN TO YOUR TOES (washing for 3-5 minutes)  DO NOT use CHG soap on face, private areas, open wounds, or sores.  Pay special attention to the area where your surgery is being performed.  If you are having back surgery, having someone wash your back for you may be helpful. Wait 2 minutes after CHG soap is applied, then you may rinse off the CHG soap.  Pat dry with a clean towel  Put on clean pajamas    Additional instructions for the day of surgery: DO NOT APPLY any lotions, oils, deodorants (may use underarm deodorant) , cologne/ perfumes or makeup Do not wear jewelry / piercing's/  metal/  permanent jewelry must be removed prior to arrival (this includes plastic piercing) Do not wear nail polish, gel polish, artificial nails, or any other type of covering on natural nails (fingers and toes) Do not bring valuables to the hospital. [facility] is not responsible for valuables/personal belongings. Put on clean/comfortable clothes.  Please brush your teeth.  Ask your nurse before applying any prescription medications to the skin.

## 2023-08-29 ENCOUNTER — Encounter (HOSPITAL_COMMUNITY)
Admission: RE | Admit: 2023-08-29 | Discharge: 2023-08-29 | Disposition: A | Discharge status: Home / Self Care | Source: Ambulatory Visit | Attending: Obstetrics | Admitting: Obstetrics

## 2023-08-29 DIAGNOSIS — Z01818 Encounter for other preprocedural examination: Secondary | ICD-10-CM | POA: Diagnosis present

## 2023-08-29 DIAGNOSIS — Z01812 Encounter for preprocedural laboratory examination: Secondary | ICD-10-CM | POA: Diagnosis not present

## 2023-08-29 LAB — CBC
HCT: 41.5 % (ref 36.0–46.0)
Hemoglobin: 13.7 g/dL (ref 12.0–15.0)
MCH: 29.1 pg (ref 26.0–34.0)
MCHC: 33 g/dL (ref 30.0–36.0)
MCV: 88.3 fL (ref 80.0–100.0)
Platelets: 306 10*3/uL (ref 150–400)
RBC: 4.7 MIL/uL (ref 3.87–5.11)
RDW: 13.3 % (ref 11.5–15.5)
WBC: 10.2 10*3/uL (ref 4.0–10.5)
nRBC: 0 % (ref 0.0–0.2)

## 2023-08-29 LAB — BASIC METABOLIC PANEL WITH GFR
Anion gap: 7 (ref 5–15)
BUN: 7 mg/dL (ref 6–20)
CO2: 26 mmol/L (ref 22–32)
Calcium: 8.9 mg/dL (ref 8.9–10.3)
Chloride: 103 mmol/L (ref 98–111)
Creatinine, Ser: 0.8 mg/dL (ref 0.44–1.00)
GFR, Estimated: >60 mL/min (ref >60)
Glucose, Bld: 107 mg/dL — ABNORMAL HIGH (ref 70–99)
Potassium: 4 mmol/L (ref 3.5–5.1)
Sodium: 136 mmol/L (ref 135–145)

## 2023-08-29 LAB — TYPE AND SCREEN
ABO/RH(D): O POS
Antibody Screen: NEGATIVE

## 2023-08-31 NOTE — Anesthesia Preprocedure Evaluation (Signed)
 Anesthesia Evaluation  Patient identified by MRN, date of birth, ID band Patient awake    Reviewed: Allergy & Precautions, NPO status , Patient's Chart, lab work & pertinent test results, reviewed documented beta blocker date and time   History of Anesthesia Complications Negative for: history of anesthetic complications  Airway Mallampati: II  TM Distance: >3 FB Neck ROM: Full    Dental  (+) Dental Advisory Given   Pulmonary COPD,  COPD inhaler, Patient abstained from smoking., former smoker   breath sounds clear to auscultation       Cardiovascular hypertension, Pt. on medications and Pt. on home beta blockers (-) angina  Rhythm:Regular Rate:Normal  '23 Stress ECHO: normal study, EF 60-80%   Neuro/Psych  Headaches  Anxiety Depression       GI/Hepatic Neg liver ROS,GERD  Medicated and Controlled,,  Endo/Other  Semaglutide BMI 41  Renal/GU negative Renal ROS     Musculoskeletal   Abdominal   Peds  Hematology negative hematology ROS (+)   Anesthesia Other Findings   Reproductive/Obstetrics                             Anesthesia Physical Anesthesia Plan  ASA: 3  Anesthesia Plan: General   Post-op Pain Management: Tylenol PO (pre-op)*   Induction: Intravenous  PONV Risk Score and Plan: 3 and Dexamethasone and Scopolamine patch - Pre-op  Airway Management Planned: Oral ETT  Additional Equipment: None  Intra-op Plan:   Post-operative Plan: Extubation in OR  Informed Consent: I have reviewed the patients History and Physical, chart, labs and discussed the procedure including the risks, benefits and alternatives for the proposed anesthesia with the patient or authorized representative who has indicated his/her understanding and acceptance.     Dental advisory given  Plan Discussed with: CRNA and Surgeon  Anesthesia Plan Comments:         Anesthesia Quick Evaluation

## 2023-09-01 ENCOUNTER — Ambulatory Visit (HOSPITAL_BASED_OUTPATIENT_CLINIC_OR_DEPARTMENT_OTHER): Payer: Self-pay

## 2023-09-01 ENCOUNTER — Encounter (HOSPITAL_COMMUNITY): Payer: Self-pay | Admitting: Obstetrics

## 2023-09-01 ENCOUNTER — Encounter (HOSPITAL_COMMUNITY)
Admission: RE | Disposition: A | Discharge status: Home / Self Care | Payer: Self-pay | Source: Home / Self Care | Attending: Obstetrics

## 2023-09-01 ENCOUNTER — Ambulatory Visit (HOSPITAL_COMMUNITY)
Admission: RE | Admit: 2023-09-01 | Discharge: 2023-09-01 | Disposition: A | Discharge status: Home / Self Care | Payer: 59 | Attending: Obstetrics | Admitting: Obstetrics

## 2023-09-01 ENCOUNTER — Ambulatory Visit (HOSPITAL_COMMUNITY): Payer: Self-pay

## 2023-09-01 ENCOUNTER — Other Ambulatory Visit: Payer: Self-pay

## 2023-09-01 DIAGNOSIS — N8189 Other female genital prolapse: Secondary | ICD-10-CM | POA: Insufficient documentation

## 2023-09-01 DIAGNOSIS — Z87891 Personal history of nicotine dependence: Secondary | ICD-10-CM | POA: Insufficient documentation

## 2023-09-01 DIAGNOSIS — I10 Essential (primary) hypertension: Secondary | ICD-10-CM

## 2023-09-01 DIAGNOSIS — N813 Complete uterovaginal prolapse: Secondary | ICD-10-CM

## 2023-09-01 DIAGNOSIS — R102 Pelvic and perineal pain unspecified side: Secondary | ICD-10-CM

## 2023-09-01 DIAGNOSIS — K5900 Constipation, unspecified: Secondary | ICD-10-CM | POA: Diagnosis not present

## 2023-09-01 DIAGNOSIS — N393 Stress incontinence (female) (male): Secondary | ICD-10-CM | POA: Diagnosis not present

## 2023-09-01 DIAGNOSIS — F32A Depression, unspecified: Secondary | ICD-10-CM | POA: Insufficient documentation

## 2023-09-01 DIAGNOSIS — F419 Anxiety disorder, unspecified: Secondary | ICD-10-CM | POA: Diagnosis not present

## 2023-09-01 DIAGNOSIS — K219 Gastro-esophageal reflux disease without esophagitis: Secondary | ICD-10-CM | POA: Diagnosis not present

## 2023-09-01 DIAGNOSIS — N814 Uterovaginal prolapse, unspecified: Secondary | ICD-10-CM

## 2023-09-01 DIAGNOSIS — J449 Chronic obstructive pulmonary disease, unspecified: Secondary | ICD-10-CM | POA: Diagnosis not present

## 2023-09-01 DIAGNOSIS — Z79899 Other long term (current) drug therapy: Secondary | ICD-10-CM | POA: Diagnosis not present

## 2023-09-01 DIAGNOSIS — Z6841 Body Mass Index (BMI) 40.0 and over, adult: Secondary | ICD-10-CM

## 2023-09-01 DIAGNOSIS — Z01818 Encounter for other preprocedural examination: Secondary | ICD-10-CM

## 2023-09-01 DIAGNOSIS — N3281 Overactive bladder: Secondary | ICD-10-CM | POA: Diagnosis not present

## 2023-09-01 DIAGNOSIS — N3946 Mixed incontinence: Secondary | ICD-10-CM | POA: Insufficient documentation

## 2023-09-01 HISTORY — PX: XI ROBOTIC ASSISTED TOTAL HYSTERECTOMY WITH SACROCOLPOPEXY: SHX6825

## 2023-09-01 HISTORY — PX: CYSTOSCOPY: SHX5120

## 2023-09-01 HISTORY — PX: RECTOCELE REPAIR: SHX761

## 2023-09-01 HISTORY — PX: BLADDER SUSPENSION: SHX72

## 2023-09-01 LAB — POCT PREGNANCY, URINE: Preg Test, Ur: NEGATIVE

## 2023-09-01 SURGERY — HYSTERECTOMY, TOTAL, ROBOT-ASSISTED, WITH SACROCOLPOPEXY
Anesthesia: General | Site: Vagina

## 2023-09-01 MED ORDER — LIDOCAINE-EPINEPHRINE 1 %-1:100000 IJ SOLN
INTRAMUSCULAR | Status: AC
  Filled 2023-09-01: qty 1

## 2023-09-01 MED ORDER — ONDANSETRON HCL 4 MG/2ML IJ SOLN: 4.0000 mg | Freq: Four times a day (QID) | INTRAMUSCULAR | Status: DC | PRN

## 2023-09-01 MED ORDER — CHLORHEXIDINE GLUCONATE 0.12 % MT SOLN
15.0000 mL | Freq: Once | OROMUCOSAL | Status: AC
  Administered 2023-09-01: 15 mL via OROMUCOSAL

## 2023-09-01 MED ORDER — GABAPENTIN 300 MG PO CAPS
300.0000 mg | ORAL_CAPSULE | ORAL | Status: AC
  Administered 2023-09-01: 300 mg via ORAL

## 2023-09-01 MED ORDER — BUPIVACAINE-EPINEPHRINE (PF) 0.25% -1:200000 IJ SOLN
INTRAMUSCULAR | Status: AC
  Filled 2023-09-01: qty 30

## 2023-09-01 MED ORDER — CIPROFLOXACIN IN D5W 400 MG/200ML IV SOLN
400.0000 mg | INTRAVENOUS | Status: AC
  Administered 2023-09-01: 400 mg via INTRAVENOUS
  Filled 2023-09-01: qty 200

## 2023-09-01 MED ORDER — ROCURONIUM BROMIDE 10 MG/ML (PF) SYRINGE
PREFILLED_SYRINGE | INTRAVENOUS | Status: DC | PRN
  Administered 2023-09-01: 20 mg via INTRAVENOUS
  Administered 2023-09-01: 60 mg via INTRAVENOUS
  Administered 2023-09-01: 10 mg via INTRAVENOUS
  Administered 2023-09-01: 20 mg via INTRAVENOUS
  Administered 2023-09-01: 10 mg via INTRAVENOUS

## 2023-09-01 MED ORDER — SODIUM CHLORIDE 0.9 % IR SOLN
Status: DC | PRN
  Administered 2023-09-01: 1000 mL
  Administered 2023-09-01 (×2): 1000 mL via INTRAVESICAL

## 2023-09-01 MED ORDER — ONDANSETRON HCL 4 MG/2ML IJ SOLN
INTRAMUSCULAR | Status: AC
  Filled 2023-09-01: qty 2

## 2023-09-01 MED ORDER — BUPIVACAINE HCL (PF) 0.25 % IJ SOLN
INTRAMUSCULAR | Status: AC
  Filled 2023-09-01: qty 30

## 2023-09-01 MED ORDER — FENTANYL CITRATE (PF) 250 MCG/5ML IJ SOLN
INTRAMUSCULAR | Status: AC
  Filled 2023-09-01: qty 5

## 2023-09-01 MED ORDER — BUPIVACAINE HCL (PF) 0.25 % IJ SOLN
INTRAMUSCULAR | Status: DC | PRN
Start: 2023-09-01 — End: 2023-09-01
  Administered 2023-09-01: 15 mL

## 2023-09-01 MED ORDER — ACETAMINOPHEN 500 MG PO TABS
1000.0000 mg | ORAL_TABLET | ORAL | Status: AC
  Administered 2023-09-01: 1000 mg via ORAL

## 2023-09-01 MED ORDER — MEPERIDINE HCL 25 MG/ML IJ SOLN: 6.2500 mg | INTRAMUSCULAR | Status: DC | PRN

## 2023-09-01 MED ORDER — PHENYLEPHRINE 80 MCG/ML (10ML) SYRINGE FOR IV PUSH (FOR BLOOD PRESSURE SUPPORT)
PREFILLED_SYRINGE | INTRAVENOUS | Status: AC
  Filled 2023-09-01: qty 10

## 2023-09-01 MED ORDER — SUGAMMADEX SODIUM 200 MG/2ML IV SOLN
INTRAVENOUS | Status: DC | PRN
  Administered 2023-09-01: 220 mg via INTRAVENOUS

## 2023-09-01 MED ORDER — LIDOCAINE 2% (20 MG/ML) 5 ML SYRINGE
INTRAMUSCULAR | Status: DC | PRN
  Administered 2023-09-01: 40 mg via INTRAVENOUS

## 2023-09-01 MED ORDER — LACTATED RINGERS IV SOLN: INTRAVENOUS | Status: DC

## 2023-09-01 MED ORDER — ACETAMINOPHEN 500 MG PO TABS
1000.0000 mg | ORAL_TABLET | Freq: Once | ORAL | Status: DC
Start: 2023-09-01 — End: 2023-09-01

## 2023-09-01 MED ORDER — OXYCODONE HCL 5 MG/5ML PO SOLN: 5.0000 mg | Freq: Once | ORAL | Status: DC | PRN

## 2023-09-01 MED ORDER — PROPOFOL 10 MG/ML IV BOLUS
INTRAVENOUS | Status: DC | PRN
  Administered 2023-09-01: 200 mg via INTRAVENOUS

## 2023-09-01 MED ORDER — SCOPOLAMINE 1 MG/3DAYS TD PT72
1.0000 | MEDICATED_PATCH | TRANSDERMAL | Status: DC
  Administered 2023-09-01: 1.5 mg via TRANSDERMAL

## 2023-09-01 MED ORDER — MIDAZOLAM HCL 2 MG/2ML IJ SOLN
INTRAMUSCULAR | Status: DC | PRN
  Administered 2023-09-01: 2 mg via INTRAVENOUS

## 2023-09-01 MED ORDER — ORAL CARE MOUTH RINSE: 15.0000 mL | Freq: Once | OROMUCOSAL | Status: AC

## 2023-09-01 MED ORDER — PHENYLEPHRINE 80 MCG/ML (10ML) SYRINGE FOR IV PUSH (FOR BLOOD PRESSURE SUPPORT)
PREFILLED_SYRINGE | INTRAVENOUS | Status: DC | PRN
  Administered 2023-09-01: 80 ug via INTRAVENOUS
  Administered 2023-09-01: 160 ug via INTRAVENOUS
  Administered 2023-09-01 (×2): 80 ug via INTRAVENOUS
  Administered 2023-09-01: 160 ug via INTRAVENOUS

## 2023-09-01 MED ORDER — SCOPOLAMINE 1 MG/3DAYS TD PT72
MEDICATED_PATCH | TRANSDERMAL | Status: AC
  Filled 2023-09-01: qty 1

## 2023-09-01 MED ORDER — CLINDAMYCIN PHOSPHATE 900 MG/50ML IV SOLN
INTRAVENOUS | Status: AC
Start: 2023-09-01 — End: 2023-09-01
  Filled 2023-09-01: qty 50

## 2023-09-01 MED ORDER — GABAPENTIN 100 MG PO CAPS: 100.0000 mg | ORAL_CAPSULE | Freq: Two times a day (BID) | ORAL | Status: DC

## 2023-09-01 MED ORDER — ROCURONIUM BROMIDE 10 MG/ML (PF) SYRINGE
PREFILLED_SYRINGE | INTRAVENOUS | Status: AC
  Filled 2023-09-01: qty 10

## 2023-09-01 MED ORDER — MENTHOL 3 MG MT LOZG: 1.0000 | LOZENGE | OROMUCOSAL | Status: DC | PRN

## 2023-09-01 MED ORDER — SODIUM CHLORIDE (PF) 0.9 % IJ SOLN
INTRAMUSCULAR | Status: AC
  Filled 2023-09-01: qty 10

## 2023-09-01 MED ORDER — ACETAMINOPHEN 325 MG PO TABS: 650.0000 mg | ORAL_TABLET | ORAL | Status: DC | PRN

## 2023-09-01 MED ORDER — DEXAMETHASONE SODIUM PHOSPHATE 10 MG/ML IJ SOLN
INTRAMUSCULAR | Status: DC | PRN
  Administered 2023-09-01: 10 mg via INTRAVENOUS

## 2023-09-01 MED ORDER — LIDOCAINE 2% (20 MG/ML) 5 ML SYRINGE
INTRAMUSCULAR | Status: AC
  Filled 2023-09-01: qty 5

## 2023-09-01 MED ORDER — HYDROMORPHONE HCL 2 MG PO TABS: 1.0000 mg | ORAL_TABLET | ORAL | Status: DC | PRN

## 2023-09-01 MED ORDER — CLINDAMYCIN PHOSPHATE 900 MG/50ML IV SOLN
900.0000 mg | INTRAVENOUS | Status: AC
  Administered 2023-09-01: 900 mg via INTRAVENOUS

## 2023-09-01 MED ORDER — LIDOCAINE HCL (PF) 1 % IJ SOLN
INTRAMUSCULAR | Status: AC
  Filled 2023-09-01: qty 30

## 2023-09-01 MED ORDER — PROPOFOL 10 MG/ML IV BOLUS
INTRAVENOUS | Status: AC
  Filled 2023-09-01: qty 20

## 2023-09-01 MED ORDER — HYDROMORPHONE HCL 1 MG/ML IJ SOLN: 0.2500 mg | INTRAMUSCULAR | Status: DC | PRN

## 2023-09-01 MED ORDER — HYDROMORPHONE HCL 1 MG/ML IJ SOLN
INTRAMUSCULAR | Status: AC
  Filled 2023-09-01: qty 0.5

## 2023-09-01 MED ORDER — CHLORHEXIDINE GLUCONATE 0.12 % MT SOLN
OROMUCOSAL | Status: DC
Start: 2023-09-01 — End: 2023-09-01
  Filled 2023-09-01: qty 15

## 2023-09-01 MED ORDER — ONDANSETRON HCL 4 MG PO TABS: 4.0000 mg | ORAL_TABLET | Freq: Four times a day (QID) | ORAL | Status: DC | PRN

## 2023-09-01 MED ORDER — PHENAZOPYRIDINE HCL 100 MG PO TABS
200.0000 mg | ORAL_TABLET | ORAL | Status: AC
  Administered 2023-09-01: 200 mg via ORAL

## 2023-09-01 MED ORDER — FENTANYL CITRATE (PF) 250 MCG/5ML IJ SOLN
INTRAMUSCULAR | Status: DC | PRN
  Administered 2023-09-01: 150 ug via INTRAVENOUS
  Administered 2023-09-01 (×2): 50 ug via INTRAVENOUS

## 2023-09-01 MED ORDER — MIDAZOLAM HCL 2 MG/2ML IJ SOLN: 0.5000 mg | Freq: Once | INTRAMUSCULAR | Status: DC | PRN

## 2023-09-01 MED ORDER — DEXAMETHASONE SODIUM PHOSPHATE 10 MG/ML IJ SOLN
INTRAMUSCULAR | Status: AC
  Filled 2023-09-01: qty 1

## 2023-09-01 MED ORDER — HYDROMORPHONE HCL 1 MG/ML IJ SOLN
INTRAMUSCULAR | Status: DC | PRN
  Administered 2023-09-01: .5 mg via INTRAVENOUS

## 2023-09-01 MED ORDER — SIMETHICONE 80 MG PO CHEW: 80.0000 mg | CHEWABLE_TABLET | Freq: Four times a day (QID) | ORAL | Status: DC | PRN

## 2023-09-01 MED ORDER — LIDOCAINE-EPINEPHRINE 1 %-1:100000 IJ SOLN
INTRAMUSCULAR | Status: DC | PRN
  Administered 2023-09-01: 30 mL

## 2023-09-01 MED ORDER — OXYCODONE HCL 5 MG PO TABS: 5.0000 mg | ORAL_TABLET | ORAL | Status: DC | PRN

## 2023-09-01 MED ORDER — MIDAZOLAM HCL 2 MG/2ML IJ SOLN
INTRAMUSCULAR | Status: AC
  Filled 2023-09-01: qty 2

## 2023-09-01 MED ORDER — OXYCODONE HCL 5 MG PO TABS: 5.0000 mg | ORAL_TABLET | Freq: Once | ORAL | Status: DC | PRN

## 2023-09-01 MED ORDER — GABAPENTIN 300 MG PO CAPS
ORAL_CAPSULE | ORAL | Status: AC
  Filled 2023-09-01: qty 1

## 2023-09-01 MED ORDER — ACETAMINOPHEN 500 MG PO TABS
ORAL_TABLET | ORAL | Status: AC
  Filled 2023-09-01: qty 2

## 2023-09-01 SURGICAL SUPPLY — 86 items
BLADE CLIPPER SENSICLIP SURGIC (BLADE) ×3 IMPLANT
BLADE SURG 15 STRL LF DISP TIS (BLADE) ×3 IMPLANT
CANISTER SUCT 3000ML PPV (MISCELLANEOUS) IMPLANT
CATH FOLEY 3WAY 5CC 16FR (CATHETERS) ×3 IMPLANT
CHLORAPREP W/TINT 26 (MISCELLANEOUS) ×3 IMPLANT
COVER BACK TABLE 60X90IN (DRAPES) ×3 IMPLANT
COVER TIP SHEARS 8 DVNC (MISCELLANEOUS) ×3 IMPLANT
DEFOGGER SCOPE WARMER CLEARIFY (MISCELLANEOUS) ×3 IMPLANT
DERMABOND ADVANCED .7 DNX12 (GAUZE/BANDAGES/DRESSINGS) ×3 IMPLANT
DRAPE ARM DVNC X/XI (DISPOSABLE) ×12 IMPLANT
DRAPE COLUMN DVNC XI (DISPOSABLE) ×3 IMPLANT
DRAPE SHEET LG 3/4 BI-LAMINATE (DRAPES) IMPLANT
DRAPE SURG IRRIG POUCH 19X23 (DRAPES) ×3 IMPLANT
DRAPE UTILITY XL STRL (DRAPES) ×3 IMPLANT
DRIVER NDL LRG 8 DVNC XI (INSTRUMENTS) ×3 IMPLANT
DRIVER NDL MEGA SUTCUT DVNCXI (INSTRUMENTS) ×3 IMPLANT
DRIVER NDLE LRG 8 DVNC XI (INSTRUMENTS) ×3 IMPLANT
DRIVER NDLE MEGA SUTCUT DVNCXI (INSTRUMENTS) ×3 IMPLANT
ELECT REM PT RETURN 9FT ADLT (ELECTROSURGICAL) ×3 IMPLANT
ELECTRODE REM PT RTRN 9FT ADLT (ELECTROSURGICAL) ×3 IMPLANT
FORCEPS BPLR 8 MD DVNC XI (FORCEP) ×3 IMPLANT
GAUZE 4X4 16PLY ~~LOC~~+RFID DBL (SPONGE) ×6 IMPLANT
GLOVE BIO SURGEON STRL SZ 6 (GLOVE) ×9 IMPLANT
GLOVE BIOGEL PI IND STRL 6 (GLOVE) ×3 IMPLANT
GLOVE BIOGEL PI IND STRL 6.5 (GLOVE) ×9 IMPLANT
GLOVE BIOGEL PI IND STRL 7.0 (GLOVE) ×6 IMPLANT
GLOVE BIOGEL PI MICRO STRL 5.5 (GLOVE) ×3 IMPLANT
GLOVE SS PI 5.5 STRL (GLOVE) ×3 IMPLANT
GLOVE SURG ENC MOIS LTX SZ6 (GLOVE) ×3 IMPLANT
GLOVE SURG UNDER POLY LF SZ6.5 (GLOVE) ×6 IMPLANT
GOWN STRL REUS W/ TWL LRG LVL3 (GOWN DISPOSABLE) ×3 IMPLANT
GRASPER TIP-UP FEN DVNC XI (INSTRUMENTS) ×3 IMPLANT
HIBICLENS CHG 4% 4OZ BTL (MISCELLANEOUS) ×3 IMPLANT
HOLDER FOLEY CATH W/STRAP (MISCELLANEOUS) ×6 IMPLANT
IRRIG SUCT STRYKERFLOW 2 WTIP (MISCELLANEOUS) ×3 IMPLANT
IRRIGATION SUCT STRKRFLW 2 WTP (MISCELLANEOUS) ×3 IMPLANT
KIT PINK PAD W/HEAD ARE REST (MISCELLANEOUS) ×3 IMPLANT
KIT PINK PAD W/HEAD ARM REST (MISCELLANEOUS) ×3 IMPLANT
KIT TURNOVER KIT B (KITS) ×3 IMPLANT
LEGGING LITHOTOMY PAIR STRL (DRAPES) ×3 IMPLANT
MANIFOLD NEPTUNE II (INSTRUMENTS) ×3 IMPLANT
MANIPULATOR ADVINCU DEL 2.5 PL (MISCELLANEOUS) IMPLANT
MANIPULATOR ADVINCU DEL 3.0 PL (MISCELLANEOUS) IMPLANT
MANIPULATOR ADVINCU DEL 3.5 PL (MISCELLANEOUS) IMPLANT
MANIPULATOR ADVINCU DEL 4.0 PL (MISCELLANEOUS) IMPLANT
MESH VERTESSA LITE -Y 2X4X3 (Mesh General) ×3 IMPLANT
NDL HYPO 22X1.5 SAFETY MO (MISCELLANEOUS) ×3 IMPLANT
NDL INSUFFLATION 14GA 120MM (NEEDLE) ×3 IMPLANT
NEEDLE HYPO 22X1.5 SAFETY MO (MISCELLANEOUS) ×3 IMPLANT
NEEDLE INSUFFLATION 14GA 120MM (NEEDLE) ×3 IMPLANT
NS IRRIG 1000ML POUR BTL (IV SOLUTION) ×3 IMPLANT
OBTURATOR OPTICAL STND 8 DVNC (TROCAR) ×3 IMPLANT
OBTURATOR OPTICALSTD 8 DVNC (TROCAR) ×3 IMPLANT
PACK CYSTO (CUSTOM PROCEDURE TRAY) ×3 IMPLANT
PACK ROBOT WH (CUSTOM PROCEDURE TRAY) ×3 IMPLANT
PACK ROBOTIC GOWN (GOWN DISPOSABLE) ×3 IMPLANT
PACK VAGINAL WOMENS (CUSTOM PROCEDURE TRAY) ×3 IMPLANT
PAD OB MATERNITY 11 LF (PERSONAL CARE ITEMS) ×3 IMPLANT
PATTIES SURGICAL .5 X3 (DISPOSABLE) IMPLANT
RETRACTOR LONE STAR DISPOSABLE (INSTRUMENTS) ×3 IMPLANT
RETRACTOR STAY HOOK 5MM (MISCELLANEOUS) ×3 IMPLANT
SCISSORS MNPLR CVD DVNC XI (INSTRUMENTS) ×3 IMPLANT
SEAL UNIV 5-12 XI (MISCELLANEOUS) ×12 IMPLANT
SET CYSTO W/LG BORE CLAMP LF (SET/KITS/TRAYS/PACK) ×3 IMPLANT
SET IRRIG Y TYPE TUR BLADDER L (SET/KITS/TRAYS/PACK) ×3 IMPLANT
SET TUBE SMOKE EVAC HIGH FLOW (TUBING) ×3 IMPLANT
SLEEVE SCD COMPRESS KNEE MED (STOCKING) ×3 IMPLANT
SLING ADVANTAGE FIT TRANVAG (Sling) IMPLANT
SPIKE FLUID TRANSFER (MISCELLANEOUS) ×3 IMPLANT
SPONGE T-LAP 4X18 ~~LOC~~+RFID (SPONGE) IMPLANT
SUCTION TUBE FRAZIER 10FR DISP (SUCTIONS) ×3 IMPLANT
SURGIFLO W/THROMBIN 8M KIT (HEMOSTASIS) IMPLANT
SUT ABS MONO DBL WITH NDL 48IN (SUTURE) IMPLANT
SUT GORETEX NAB #0 THX26 36IN (SUTURE) IMPLANT
SUT MNCRL AB 4-0 PS2 18 (SUTURE) ×6 IMPLANT
SUT MON AB 2-0 SH 27 (SUTURE) ×3 IMPLANT
SUT PDS AB 2-0 CT2 27 (SUTURE) IMPLANT
SUT STRATA PDS 2-0 23 CT-1 (SUTURE) ×6 IMPLANT
SUT VIC AB 2-0 SH 27X BRD (SUTURE) IMPLANT
SUT VIC AB 2-0 SH 27XBRD (SUTURE) ×3 IMPLANT
SUT VIC AB 3-0 SH 18 (SUTURE) IMPLANT
SUT VICRYL 2-0 SH 8X27 (SUTURE) ×3 IMPLANT
SUT VLOC 180 0 9IN GS21 (SUTURE) ×3 IMPLANT
SYR BULB EAR ULCER 3OZ GRN STR (SYRINGE) ×3 IMPLANT
TOWEL GREEN STERILE FF (TOWEL DISPOSABLE) ×6 IMPLANT
TRAY FOLEY W/BAG SLVR 14FR (SET/KITS/TRAYS/PACK) ×3 IMPLANT

## 2023-09-01 NOTE — Discharge Instructions (Addendum)

## 2023-09-01 NOTE — Transfer of Care (Signed)
 Immediate Anesthesia Transfer of Care Note  Patient: [name]  Procedure(s) Performed: XI ROBOTIC ASSISTED TOTAL HYSTERECTOMY WITH BILATERAL SALPINGECTOMY AND SACROCOLPOPEXY (Pelvis) POSTERIOR REPAIR (RECTOCELE) W/ PERINEORRHAPHY (Vagina ) TRANSVAGINAL TAPE (TVT) PROCEDURE (Vagina ) CYSTOSCOPY (Bladder)  Patient Location: PACU  Anesthesia Type:General  Level of Consciousness: awake and sedated  Airway & Oxygen Therapy: Patient Spontanous Breathing and Patient connected to face mask oxygen  Post-op Assessment: Report given to RN and Post -op Vital signs reviewed and stable  Post vital signs: Reviewed and stable  Last Vitals:  Vitals Value Taken Time  BP 142/88 09/01/23 1245  Temp    Pulse 88 09/01/23 1246  Resp 16 09/01/23 1246  SpO2 94 % 09/01/23 1246  Vitals shown include unfiled device data.  Last Pain:  Vitals:   09/01/23 1610  TempSrc: Oral  PainSc: 0-No pain      Patients Stated Pain Goal: 6 (09/01/23 9604)  Complications: No notable events documented.

## 2023-09-01 NOTE — Op Note (Signed)
 Operative Note  Preoperative Diagnosis:  Stage III pelvic organ prolapse, mixed urinary incontinence, constipation, BMI  41  Postoperative Diagnosis: Stage III pelvic organ prolapse, mixed urinary incontinence, constipation, BMI 41  Procedures performed:  Exam under anesthesia, Robotic assisted hysterectomy with bilateral salpingectomy and sacrocolpopexy, posterior repair with perineorrhaphy, Mid-urethral sling, and cystoscopy   Implants:  Implant Name Type Inv. Item Serial No. Manufacturer Lot No. LRB No. Used Action  MESH Grayland Ormond [id number] Mesh General MESH Arlys John  [facility] [id number] N/A 1 Implanted  [place] [employer] [facility] - [id number] Sling [employer] [id number] N/A 1 Implanted    Attending Surgeon: Jiles Garter, MD  Assistant Surgeon: n/a  Anesthesia: General endotracheal  Findings: 1. On vaginal exam, stage III prolapse present  2. On cystoscopy, normal bladder and urethral mucosa without injury or lesion. Brisk bilateral ureteral efflux present.    3. On laparoscopy, adhesions were noted at the rectovaginal space.  Specimens:  ID Type Source Tests Collected by Time Destination  1 : uterus cervix and bilateral tubes Tissue PATH Soft tissue SURGICAL PATHOLOGY Wyatt Haste T, MD 09/01/2023 [phone]     Estimated blood loss: 100 mL  IV fluids: 1520 mL  Urine output: 850 mL  Complications: none  Procedure in Detail: After informed consent was obtained, the patient was taken to the operating room, where general anesthesia was induced and found to be adequate. She was placed in dorsolithotomy position in yellowfin stirrups. Her hips were noted not to be hyperflexed or hyperextended. Her arms were padded with gel pads and tucked to her sides. Her hands were surrounded by foam. A padded strap was placed across her chest with foam between the pad and her skin. She was noted to be appropriately positioned  with all pressure points well padded and off tension. A tilt test showed no slippage. She was prepped and draped in the usual sterile fashion.  A sterile Foley catheter was inserted.   1% lidoacine with epinephrine was injected at 1cm above the umbilicus and an incision was made with a scalpel. A Veress needle was inserted into the incision, CO2 insufflation was started, a high opening pressure was noted and the needle was removed. 1% lidoacine with epinephrine was injected at the base of the umbilicus and an incision was made with a scalpel. A Veress needle was inserted into the incision, CO2 insufflation was started, a low opening pressure was noted, and pneumoperitoneum was obtained. The Veress needle was removed and a 5mm optiview trocar was placed with direct visualization. Entry into the peritoneal cavity was confirmed. No vascular or visceral injury was noted, an abdominal survey was performed with no abnormalities noted in the bilateral upper and lower quadrants.  Survey of the abdomen and pelvis revealed the findings as noted above. The sacrum appeared to be free of any adhesive disease with a thick layer of presacral adipose tissue. After determining placement for the other ports, Local anesthetic was injected at each site and two 8 mm incisions were made for robotic ports at 10 cm lateral to and at the level of the umbilical port. Two additional 8 mm incisions were made 10 cm lateral to these and 30 degrees down followed by 8 mm robotic ports - the right side for an assistant port. All trocars were placed sequentially under direct visualization of the camera. The patient was placed in Trendelenburg. The robot was docked on the patient's  right side. Monopolar endoshears were placed in the right arm, a [place] bipolar grasper was placed in the 2nd arm of the patient's left side, and a Tip up grasper was placed in the 3rd arm on the patient's left side.   Attention was then turned to the sacral  promontory. The peritoneum overlying the sacral promontory was tented up, dissected sharply with monopolar scissors and electrosurgery using layer by layer technique. This was performed with care to avoid the ureter on the right side and the sigmoid colon and its mesentary on the left side. Two transverse sutures of CV2 Gortex were placed in the anterior longitudinal ligament.  Attention was then turned to the robotic hysterectomy and bilateral salpingectomy. The ureter was identified and was found to be well away from the planned site of incision. Using the monopolar scissors, the residual fimbriae of right fallopian tube was cauterized and transected. The right round ligament was grasped, cauterized, and transected with electrocautery. The anterior and posterior leaves of the broad ligament were taken down with cautery and sharp dissection. The uterine artery was skeletonized and the bladder flap was created on the right side with a combination of electrosurgery and sharp dissection. The KOH ring was identified. The right uterine artery was clamped, cauterized, and transected. In a similar fashion, the left side was taken down. Further sharp dissection with combination of cautery was performed to further develop the bladder flap. At this point, the KOH ring was completely hugging the cervix. The pneumo-occluder balloon in the vagina was inflated to maintain pneumoperitoneum. A colpotomy was performed with electrosurgical cutting current and the uterus and cervix were completely amputated from the vagina. The specimen was delivered through the vagina. The posterior portion of the vaginal cuff was then grasped and pulled up to maintain pneumoperitoneum. The pneumo-occluder balloon was then replaced in the vagina. The right hand instrument was changed to a suture-cut needle driver. The vaginal cuff was then closed using a 0 V-lock suture in a continuous running fashion.    The right hand instrument was replaced  with monopolar endoshears. With a lucite probe in the vagina, the anterior vaginal dissection was then performed with sharp dissection and electrosurgery. The posterior vaginal dissection was then performed with sharp dissection and electrosurgery in order to dissect the rectum away from the posterior vagina.  An interrupted suture was placed along the posterior vaginal dissection for hemostasis.  A "Y" mesh was then inserted into the abdomen after trimming to appropriate size. With the probe in the vagina, the anterior leaf of the Y mesh was affixed to the anterior portion of the vagina using a 2-0 v-loc suture in a spiral pattern to distribute the suture evenly across the surface of the anterior mesh leaf. In a similar fashion, the posterior leaf of the Y mesh was attached to the posterior surface of the vagina with 2-0 v-loc suture.  The distal end of the mesh was then brought to overlie the sacrum. The correct amount of tension was determined in order to elevate the vagina, but not put the mesh under tension. The distal end of the mesh was then affixed to the anterior longitudinal sacral ligament using two interrupted transverse stitches of CV2 Gortex. The excess distal mesh was then cut and removed. The peritoneum was reapproximated over the mesh using 2-0 monocryl. The bladder flap was incorporated to completely retroperitonealize the mesh. All pedicles were carefully inspected and noted to be hemostatic as the CO2 gas was deflated. All instruments were  removed from the patient's abdomen.   The Foley catheter was removed.  A 70-degree cystoscope was introduced, and 360-degree inspection revealed no injury, lesion or foreign body in the bladder. Brisk bilateral ureteral efflux was noted with the assistance of pyridium.  The bladder was drained and the cystoscope was removed.  The Foley catheter was replaced.   The robot was undocked. The CO2 gas was removed and the ports were removed.  The skin incisions  were closed with subcutaneous stitches of 4-0 Monocryl and covered with skin glue.   No residual anterior vaginal wall prolapse was noted. A lonestar self-retraining retractor was placed with 4 stay hooks. The mid urethral area was located on the anterior vaginal wall.  Two Allis clamps were placed at the level of the midurethra. 1% lidocaine with epinephrine was injected into the vaginal mucosa. A vertical incision was made between the two clamps using a 15-blade scalpel.  Using sharp dissection, Metzenbaum scissors were used to make a periurethral tunnel from the vaginal incision towards the pubic rami bilaterally for the future sling tracts. The bladder was ensured to be empty. The trocar and attached sling were introduced into the right side of the periurethral vaginal incision, just inferior to the pubic symphysis on the right side. The trocar was guided through the endopelvic fascia and directly vertically.  While hugging the cephalad surface of the pubic bone, the trocar was guided out through the abdomen 2 fingerbreadths lateral to midline at the level of the pubic symphysis on the ipsilateral side. The trocar was placed on the left side in a similar fashion.  A 70-degree cystoscope was introduced, and 360-degree inspection revealed no trauma or trocars in the bladder, with brisk bilateral ureteral efflux.  The bladder was drained and the cystoscope was removed.  The Foley catheter was reinserted.  The sling was brought to lie beneath the mid-urethra.  A needle driver was placed behind the sling to ensure no tension.   The plastic sheath was removed from the sling and the distal ends of the sling were trimmed just below the level of the skin incisions.  Tension-free positioning of the sling was confirmed. Vaginal inspection revealed no vaginotomy or sling perforations of the mucosa.  The vaginal mucosal edges were reapproximated using 2-0 Vicryl.  The vagina was copiously irrigated.  Hemostasis was again  noted. Vaginal packing not placed. The suprapubic sling incisions were closed with Dermabond.   Attention was then turned to the posterior vagina.  Two Allis clamps were placed at the introitus approximately 3cm from the urethra meatus. Local anesthetic was injected into the vaginal mucosa in the posterior vaginal wall and perineum for hydrodissection and hemostasis. A vertical incision was made between these clamps with a 11 blade scalpel and a diamond shaped area of epithelium was cut at the introitus.  The rectovaginal septum was then dissected off the vaginal mucosa bilaterally. The rectovaginal septum was then plicated in a continuous running fashion with 2-0 PDS while one finger was placed in the rectum to prevent rectal penetration.  After placement of the first plication stitch two fingers were inserted into the vaginal to confirm adequate caliber.  The suture incorporated the perineal body in a U stitch fashion and the bulbocavernosus muscles. A 2-0 Vicryl was used in a subcuticular fashion to re-approximate the hymenal ring. After plication, the excess vaginal mucosa was trimmed and the vaginal mucosa was reapproximated using 2-0 Vicryl sutures.  The vagina was copiously irrigated.  Hemostasis was noted.  A rectal examination was normal and confirmed no sutures within the rectum. Three fingers passed through the vaginal opening without difficulty.   The patient tolerated the procedure well. Sponge, lap, and needle counts were correct x 2. She was awakened from anesthesia and transferred to the recovery room in stable condition.

## 2023-09-01 NOTE — Anesthesia Procedure Notes (Signed)
 Procedure Name: Intubation Date/Time: 09/01/2023 7:39 AM  Performed by: Chanze Teagle C, CRNAPre-anesthesia Checklist: Patient identified, Emergency Drugs available, Suction available and Patient being monitored Patient Re-evaluated:Patient Re-evaluated prior to induction Oxygen Delivery Method: Circle system utilized Preoxygenation: Pre-oxygenation with 100% oxygen Induction Type: IV induction Ventilation: Mask ventilation without difficulty Laryngoscope Size: Mac and 3 Grade View: Grade I Tube type: Oral Tube size: 7.0 mm Number of attempts: 1 Airway Equipment and Method: Stylet and Oral airway Placement Confirmation: ETT inserted through vocal cords under direct vision, positive ETCO2 and breath sounds checked- equal and bilateral Secured at: 21 cm Tube secured with: Tape Dental Injury: Teeth and Oropharynx as per pre-operative assessment

## 2023-09-01 NOTE — Anesthesia Postprocedure Evaluation (Signed)
 Anesthesia Post Note  Patient: [name]  Procedure(s) Performed: XI ROBOTIC ASSISTED TOTAL HYSTERECTOMY WITH BILATERAL SALPINGECTOMY AND SACROCOLPOPEXY (Pelvis) POSTERIOR REPAIR (RECTOCELE) W/ PERINEORRHAPHY (Vagina ) TRANSVAGINAL TAPE (TVT) PROCEDURE (Vagina ) CYSTOSCOPY (Bladder)     Patient location during evaluation: PACU Anesthesia Type: General Level of consciousness: awake and alert, patient cooperative and oriented Pain management: pain level controlled Vital Signs Assessment: post-procedure vital signs reviewed and stable Respiratory status: spontaneous breathing, nonlabored ventilation, respiratory function stable and patient connected to nasal cannula oxygen Cardiovascular status: blood pressure returned to baseline and stable Postop Assessment: no apparent nausea or vomiting Anesthetic complications: no   No notable events documented.  Last Vitals:  Vitals:   09/01/23 1445 09/01/23 1515  BP:  128/73  Pulse:  82  Resp:  18  Temp: 36.5 C   SpO2:  97%    Last Pain:  Vitals:   09/01/23 1515  TempSrc:   PainSc: 3                  Keivon Garden,E. Atiyana Welte

## 2023-09-01 NOTE — Interval H&P Note (Signed)
 History and Physical Interval Note:  09/01/2023 7:20 AM  [name]  has presented today for surgery, with the diagnosis of uterovaginal prolapse complete; stress urinary incontinence.  The various methods of treatment have been discussed with the patient and family. After consideration of risks, benefits and other options for treatment, the patient has consented to  Procedure(s) with comments: XI ROBOTIC ASSISTED TOTAL HYSTERECTOMY WITH BILATERAL SALPINGECTOMY AND SACROCOLPOPEXY (N/A) - Total time requested is 4 hours/[facility] POSTERIOR REPAIR (RECTOCELE) W/ PERINEORRHAPHY (N/A) TRANSVAGINAL TAPE (TVT) PROCEDURE (N/A) CYSTOSCOPY (N/A) as a surgical intervention.  The patient's history has been reviewed, patient examined, no change in status, stable for surgery.  I have reviewed the patient's chart and labs.  Questions were answered to the patient's satisfaction.    Reviewed risk of conversion to native tissue suspension if limited visualization intraoperatively.  Will proceed with midurethral sling and postop void trial.   Loleta Chance

## 2023-09-02 ENCOUNTER — Encounter (HOSPITAL_COMMUNITY): Payer: Self-pay | Admitting: Obstetrics

## 2023-09-02 ENCOUNTER — Telehealth: Payer: Self-pay

## 2023-09-02 ENCOUNTER — Encounter: Payer: Self-pay | Admitting: Obstetrics and Gynecology

## 2023-09-02 NOTE — Telephone Encounter (Addendum)
 [name]  underwent Xi Robotic Assisted Total Hysterectomy With Bilateral Salpingectomy And Sacrocolpopexy, Posterior Repair (rectocele) W/ Perineorrhaphy, Transvaginal Tape (tvt) Procedure, and Cystoscopy  on 09/01/2023  with [] Dr Florian Buff [x] Dr Olena Leatherwood.  The patient reports that her pain is controlled.  She is taking [] No Medication [x] Acetaminophen 500mg  every 6 hours [x] Ibuprofen 600mg  every 6 hours or [x]  Prescribed Narcotic.  Her pain level is 5[x] with medication.  She reports vaginal bleeding. Light bleeding, no heavy bleeding.  The patient is tolerating PO fluids and solids.  She has not had a bowel movement and is taking Miralax for a bowel regimen. She is not passing gas.  She was discharged with a catheter.    [x] Discharged with a catheter, the patient is not having any concerns with her catheter.  She will return for a voiding trial on -08-2023. [x] Verified scheduled date and time with patient.  She does not having any additional questions.  Reviewed Post operative instructions as needed to answer additional questions.   CC'd note to patient's provider.

## 2023-09-02 NOTE — Telephone Encounter (Signed)
 Thanks

## 2023-09-03 ENCOUNTER — Encounter: Payer: Self-pay | Admitting: Obstetrics

## 2023-09-03 LAB — SURGICAL PATHOLOGY

## 2023-09-04 ENCOUNTER — Ambulatory Visit

## 2023-09-04 NOTE — Progress Notes (Signed)
 [name] is a 43 y.o. female here for a voiding trial today. Bladder was filled to and patient has voided Catheter was removed with no complications. Instilled: 300 ml  Voided: 

## 2023-09-04 NOTE — Patient Instructions (Signed)
 Please keep all scheduled follow ups.  It was a pleasure to see you today!  Thank you for trusting me with your care!

## 2023-09-09 ENCOUNTER — Other Ambulatory Visit: Payer: Self-pay | Admitting: Family Medicine

## 2023-09-09 DIAGNOSIS — I10 Essential (primary) hypertension: Secondary | ICD-10-CM

## 2023-09-13 DIAGNOSIS — Z419 Encounter for procedure for purposes other than remedying health state, unspecified: Secondary | ICD-10-CM | POA: Diagnosis not present

## 2023-09-15 ENCOUNTER — Encounter: Payer: Self-pay | Admitting: Family Medicine

## 2023-09-15 ENCOUNTER — Ambulatory Visit (INDEPENDENT_AMBULATORY_CARE_PROVIDER_SITE_OTHER): Admitting: Family Medicine

## 2023-09-15 VITALS — BP 125/87 | HR 82 | Temp 98.2°F | Ht 63.0 in | Wt 233.0 lb

## 2023-09-15 DIAGNOSIS — R251 Tremor, unspecified: Secondary | ICD-10-CM | POA: Diagnosis not present

## 2023-09-15 DIAGNOSIS — Z8249 Family history of ischemic heart disease and other diseases of the circulatory system: Secondary | ICD-10-CM

## 2023-09-15 DIAGNOSIS — Z8632 Personal history of gestational diabetes: Secondary | ICD-10-CM

## 2023-09-15 DIAGNOSIS — K219 Gastro-esophageal reflux disease without esophagitis: Secondary | ICD-10-CM

## 2023-09-15 DIAGNOSIS — Z6841 Body Mass Index (BMI) 40.0 and over, adult: Secondary | ICD-10-CM

## 2023-09-15 DIAGNOSIS — R110 Nausea: Secondary | ICD-10-CM

## 2023-09-15 DIAGNOSIS — E782 Mixed hyperlipidemia: Secondary | ICD-10-CM | POA: Diagnosis not present

## 2023-09-15 DIAGNOSIS — I10 Essential (primary) hypertension: Secondary | ICD-10-CM

## 2023-09-15 LAB — BAYER DCA HB A1C WAIVED: HB A1C (BAYER DCA - WAIVED): 5.4 % (ref 4.8–5.6)

## 2023-09-15 LAB — LIPID PANEL

## 2023-09-15 MED ORDER — METOPROLOL SUCCINATE ER 25 MG PO TB24: 25.0000 mg | ORAL_TABLET | Freq: Every day | ORAL | 3 refills | Status: AC

## 2023-09-15 MED ORDER — ONDANSETRON HCL 4 MG PO TABS: 4.0000 mg | ORAL_TABLET | Freq: Three times a day (TID) | ORAL | 0 refills | Status: AC | PRN

## 2023-09-15 MED ORDER — SEMAGLUTIDE-WEIGHT MANAGEMENT 2.4 MG/0.75ML ~~LOC~~ SOAJ: 2.4000 mg | SUBCUTANEOUS | 12 refills | Status: DC

## 2023-09-15 MED ORDER — AMLODIPINE BESYLATE 5 MG PO TABS: 5.0000 mg | ORAL_TABLET | Freq: Every day | ORAL | 3 refills | Status: AC

## 2023-09-15 MED ORDER — SEMAGLUTIDE-WEIGHT MANAGEMENT 0.5 MG/0.5ML ~~LOC~~ SOAJ: 0.5000 mg | SUBCUTANEOUS | 0 refills | Status: DC

## 2023-09-15 MED ORDER — PANTOPRAZOLE SODIUM 40 MG PO TBEC: 40.0000 mg | DELAYED_RELEASE_TABLET | Freq: Every day | ORAL | 3 refills | Status: AC | PRN

## 2023-09-15 MED ORDER — SEMAGLUTIDE-WEIGHT MANAGEMENT 1.7 MG/0.75ML ~~LOC~~ SOAJ: 1.7000 mg | SUBCUTANEOUS | 0 refills | Status: DC

## 2023-09-15 MED ORDER — SEMAGLUTIDE-WEIGHT MANAGEMENT 0.25 MG/0.5ML ~~LOC~~ SOAJ: 0.2500 mg | SUBCUTANEOUS | 0 refills | Status: AC

## 2023-09-15 MED ORDER — SEMAGLUTIDE-WEIGHT MANAGEMENT 1 MG/0.5ML ~~LOC~~ SOAJ: 1.0000 mg | SUBCUTANEOUS | 0 refills | Status: DC

## 2023-09-15 NOTE — Progress Notes (Signed)
 Subjective: CC: Hand tremor PCP: Raliegh Ip, DO ZOX:WRUEAV M Maund is a 43 y.o. female presenting to clinic today for:  1.  Hand tremor Patient reports tremor of the left hand that started about 2 months ago.  Uncertain etiology.  Denies any sensory changes, weakness, pain.  She is right-hand dominant.  She was apparently advised to follow-up for further evaluation of this after this was discovered by her psychiatrist.  She denies any tremor in the right hand.  2.  Morbid obesity associated with hypertension, hyperlipidemia Patient was on compounded semaglutide but this was too expensive to continue.  She is asking for assistance with something to help her reduce her appetite and weight.  She is trying to be physically active and watch her diet but sadly cannot get the weight off.   ROS: Per HPI  Allergies  Allergen Reactions   Penicillins Hives and Itching   Sulfa Antibiotics Hives and Itching   Azithromycin Anxiety and Palpitations    Chest pain and dizziness    Past Medical History:  Diagnosis Date   Alcoholism in recovery (HCC)    per pt on 06-08-2023 will be 5 yrs   Arthritis    Chronic low back pain with right-sided sciatica    Endometriosis    GAD (generalized anxiety disorder)    GERD (gastroesophageal reflux disease)    Headache    History of chest pain 2018   evaluated by cardiology--- dr Purvis Sheffield (note in epic 05-28-2017,  stated pt had syncope ED visit 04/ 2018 in setting hypertensive emergency and chest pain;   echo 05-19-2017 normal and had echo stress done 08-27-2021 low risk no ischemia ,  felt to be atypical chest pain due to HTN emergency no further work-up   History of ectopic pregnancy 2022   treated with methotrexate   Hypertension    MDD (major depressive disorder)    Pre-diabetes    Prolapse of female pelvic organs    Urinary incontinence, mixed    Wears contact lenses     Current Outpatient Medications:    acetaminophen (TYLENOL) 500  MG tablet, Take 1 tablet (500 mg total) by mouth every 6 (six) hours as needed (pain)., Disp: 30 tablet, Rfl: 0   albuterol (VENTOLIN HFA) 108 (90 Base) MCG/ACT inhaler, Inhale 2 puffs into the lungs every 6 (six) hours as needed for wheezing or shortness of breath., Disp: 8 g, Rfl: 0   amLODipine (NORVASC) 5 MG tablet, Take 1 tablet by mouth once daily, Disp: 90 tablet, Rfl: 0   ARIPiprazole (ABILIFY) 2 MG tablet, Take 1 mg by mouth in the morning., Disp: , Rfl:    cetirizine (EQ ALLERGY RELIEF, CETIRIZINE,) 10 MG tablet, Take 1 tablet by mouth once daily (Patient taking differently: Take 10 mg by mouth daily as needed for allergies.), Disp: 90 tablet, Rfl: 1   DULoxetine (CYMBALTA) 20 MG capsule, Take 20 mg by mouth in the morning., Disp: , Rfl:    hydrOXYzine (ATARAX) 25 MG tablet, Take 25 mg by mouth daily as needed for anxiety., Disp: , Rfl:    ibuprofen (ADVIL) 600 MG tablet, Take 1 tablet (600 mg total) by mouth every 6 (six) hours as needed., Disp: 30 tablet, Rfl: 0   LORazepam (ATIVAN) 0.5 MG tablet, Take 0.5 tablets (0.25 mg total) by mouth every other day as needed for anxiety. USE SPARINGLY, Disp: 15 tablet, Rfl: 1   metoprolol succinate (TOPROL-XL) 25 MG 24 hr tablet, Take 1 tablet (25 mg  total) by mouth daily. (Patient taking differently: Take 12.5 mg by mouth in the morning.), Disp: 90 tablet, Rfl: 3   ondansetron (ZOFRAN) 4 MG tablet, Take 1 tablet (4 mg total) by mouth every 8 (eight) hours as needed for nausea or vomiting., Disp: 20 tablet, Rfl: 0   oxyCODONE (OXY IR/ROXICODONE) 5 MG immediate release tablet, Take 1 tablet (5 mg total) by mouth every 4 (four) hours as needed for severe pain (pain score 7-10)., Disp: 7 tablet, Rfl: 0   pantoprazole (PROTONIX) 40 MG tablet, Take 1 tablet (40 mg total) by mouth daily. (Patient taking differently: Take 40 mg by mouth daily as needed (indigestion/heartburn.).), Disp: 90 tablet, Rfl: 3   polyethylene glycol powder (GLYCOLAX/MIRALAX) 17  GM/SCOOP powder, Take 17 g by mouth daily. Drink 17g (1 scoop) dissolved in water per day., Disp: 255 g, Rfl: 0   Semaglutide-Weight Management 0.25 MG/0.5ML SOAJ, Inject 0.25 mg into the skin once a week. Tuesday's, Disp: , Rfl:    triamcinolone cream (KENALOG) 0.1 %, Apply 1 Application topically 2 (two) times daily. (Patient taking differently: Apply 1 Application topically 2 (two) times daily as needed (skin irritation.).), Disp: 30 g, Rfl: 0   Vibegron (GEMTESA) 75 MG TABS, Take 75 mg by mouth at bedtime., Disp: , Rfl:   Current Facility-Administered Medications:    levonorgestrel (MIRENA) 20 MCG/24HR IUD 1 each, 1 each, Intrauterine, Once, Dettinger, Elige Radon, MD Social History   Socioeconomic History   Marital status: Divorced    Spouse name: Not on file   Number of children: Not on file   Years of education: Not on file   Highest education level: Some college, no degree  Occupational History   Occupation: CMA  Tobacco Use   Smoking status: Former    Current packs/day: 0.00    Average packs/day: 1 pack/day for 13.6 years (13.6 ttl pk-yrs)    Types: Cigarettes    Start date: 05/29/2006    Quit date: 01/07/2020    Years since quitting: 3.6   Smokeless tobacco: Never   Tobacco comments:    05-23-2023  pt stated smoking 2021,  started age 3  Vaping Use   Vaping status: Every Day   Substances: Nicotine, Flavoring   Devices: raz  Substance and Sexual Activity   Alcohol use: Not Currently    Comment: recovery alcoholic on 06-12-2023   Drug use: Yes    Types: Marijuana    Comment: 05-23-2023  per pt average smokes marijuana daily "1-2 hits"   Sexual activity: Not Currently    Birth control/protection: I.U.D.  Other Topics Concern   Not on file  Social History Narrative   Not on file   Social Drivers of Health   Financial Resource Strain: Low Risk  (09/11/2023)   Overall Financial Resource Strain (CARDIA)    Difficulty of Paying Living Expenses: Not hard at all  Food  Insecurity: No Food Insecurity (09/11/2023)   Hunger Vital Sign    Worried About Running Out of Food in the Last Year: Never true    Ran Out of Food in the Last Year: Never true  Transportation Needs: No Transportation Needs (09/11/2023)   PRAPARE - Administrator, Civil Service (Medical): No    Lack of Transportation (Non-Medical): No  Physical Activity: Insufficiently Active (09/11/2023)   Exercise Vital Sign    Days of Exercise per Week: 2 days    Minutes of Exercise per Session: 10 min  Stress: No Stress Concern Present (09/11/2023)  [employer] of Occupational Health - Occupational Stress Questionnaire    Feeling of Stress : Not at all  Social Connections: Moderately Isolated (09/11/2023)   Social Connection and Isolation Panel [NHANES]    Frequency of Communication with Friends and Family: More than three times a week    Frequency of Social Gatherings with Friends and Family: Three times a week    Attends Religious Services: 1 to 4 times per year    Active Member of Clubs or Organizations: No    Attends Engineer, structural: Not on file    Marital Status: Separated  Intimate Partner Violence: Not on file   Family History  Problem Relation Age of Onset   Depression Mother    Stroke Mother    Diabetes Father    Hypertension Father    Stroke Father    Heart attack Father 30   Hypertension Sister    Hypertension Brother    Heart attack Brother 18       x3 MI (twin bro)   CAD Brother    Asthma Daughter    Breast cancer Paternal Grandmother 43   Hypertension Brother    Healthy Daughter     Objective: Office vital signs reviewed. BP 125/87   Pulse 82   Temp 98.2 F (36.8 C)   Ht 5\' 3"  (1.6 m)   Wt 233 lb (105.7 kg)   SpO2 97%   BMI 41.27 kg/m   Physical Examination:  General: Awake, alert, morbidly obese, No acute distress HEENT: Sclera white.  Moist mucous membranes Cardio: regular rate and rhythm, S1S2 heard, no murmurs  appreciated Pulm: clear to auscultation bilaterally, no wheezes, rhonchi or rales; normal work of breathing on room air GI: Obese, soft, mild suprapubic tenderness (just had hysterectomy a couple of weeks ago).  No guarding or rebound Neuro: Fine tremor noted on the left hand when raised but not present when in seated, resting position  Assessment/ Plan: 43 y.o. female   Tremor of left hand - Plan: Semaglutide-Weight Management 0.25 MG/0.5ML SOAJ, Semaglutide-Weight Management 0.5 MG/0.5ML SOAJ, Semaglutide-Weight Management 1 MG/0.5ML SOAJ, Semaglutide-Weight Management 1.7 MG/0.75ML SOAJ, Semaglutide-Weight Management 2.4 MG/0.75ML SOAJ, Ambulatory referral to Neurology, CMP14+EGFR, TSH + free T4, CBC, Magnesium, Vitamin B12  Morbid obesity with BMI of 40.0-44.9, adult (HCC) - Plan: Semaglutide-Weight Management 0.25 MG/0.5ML SOAJ, Semaglutide-Weight Management 0.5 MG/0.5ML SOAJ, Semaglutide-Weight Management 1 MG/0.5ML SOAJ, Semaglutide-Weight Management 1.7 MG/0.75ML SOAJ, Semaglutide-Weight Management 2.4 MG/0.75ML SOAJ, VITAMIN D 25 Hydroxy (Vit-D Deficiency, Fractures), Bayer DCA Hb A1c Waived  Essential hypertension - Plan: amLODipine (NORVASC) 5 MG tablet, metoprolol succinate (TOPROL-XL) 25 MG 24 hr tablet, Semaglutide-Weight Management 0.25 MG/0.5ML SOAJ, Semaglutide-Weight Management 0.5 MG/0.5ML SOAJ, Semaglutide-Weight Management 1 MG/0.5ML SOAJ, Semaglutide-Weight Management 1.7 MG/0.75ML SOAJ, Semaglutide-Weight Management 2.4 MG/0.75ML SOAJ, CMP14+EGFR  Mixed hyperlipidemia - Plan: Semaglutide-Weight Management 0.25 MG/0.5ML SOAJ, Semaglutide-Weight Management 0.5 MG/0.5ML SOAJ, Semaglutide-Weight Management 1 MG/0.5ML SOAJ, Semaglutide-Weight Management 1.7 MG/0.75ML SOAJ, Semaglutide-Weight Management 2.4 MG/0.75ML SOAJ, CMP14+EGFR, Lipid Panel, TSH + free T4  History of gestational diabetes - Plan: Semaglutide-Weight Management 0.25 MG/0.5ML SOAJ, Semaglutide-Weight Management 0.5  MG/0.5ML SOAJ, Semaglutide-Weight Management 1 MG/0.5ML SOAJ, Semaglutide-Weight Management 1.7 MG/0.75ML SOAJ, Semaglutide-Weight Management 2.4 MG/0.75ML SOAJ  Family history of early CAD - Plan: Semaglutide-Weight Management 0.25 MG/0.5ML SOAJ, Semaglutide-Weight Management 0.5 MG/0.5ML SOAJ, Semaglutide-Weight Management 1 MG/0.5ML SOAJ, Semaglutide-Weight Management 1.7 MG/0.75ML SOAJ, Semaglutide-Weight Management 2.4 MG/0.75ML SOAJ  Nausea - Plan: ondansetron (ZOFRAN) 4 MG tablet  Gastroesophageal reflux disease without esophagitis - Plan: pantoprazole (PROTONIX) 40  MG tablet  Referral to neurology for further evaluation though I suspect that this is an essential tremor and I discussed that with the patient.  She is already treated with beta-blocker and calcium channel blocker.  Uncertain if her medications are the cause of the tremor as this is unilateral only.  There is no family history of movement disorders including Parkinson disease  She got fasting labs done today.  Her blood pressure was under good control with current regimen.  I do believe her to be a candidate for [facility] and she ought to be able to get this through Medicaid.  She meets BMI requirement at over 41 kg/m.  She is actively in a weight loss exercise program and diet program but is not losing weight.  She has hypertension, hyperlipidemia, personal history of gestational diabetes and family history of early CAD.  I have renewed her Zofran for as needed use in anticipation she may need it for aforementioned treatment plan as well as renewed her PPI.  Discussed potential side effects of medication including increased acid reflux, nausea and abdominal pain.  No apparent contraindications.  Has total hysterectomy in place for contraception   Eliodoro Guerin, DO [facility] [phone]

## 2023-09-16 ENCOUNTER — Encounter: Payer: Self-pay | Admitting: Family Medicine

## 2023-09-16 ENCOUNTER — Other Ambulatory Visit: Payer: Self-pay | Admitting: Family Medicine

## 2023-09-16 ENCOUNTER — Telehealth: Payer: Self-pay

## 2023-09-16 DIAGNOSIS — E559 Vitamin D deficiency, unspecified: Secondary | ICD-10-CM

## 2023-09-16 LAB — CMP14+EGFR
ALT: 13 IU/L (ref 0–32)
AST: 14 IU/L (ref 0–40)
Albumin: 4.2 g/dL (ref 3.9–4.9)
Alkaline Phosphatase: 83 IU/L (ref 44–121)
BUN/Creatinine Ratio: 19 (ref 9–23)
BUN: 16 mg/dL (ref 6–24)
CO2: 23 mmol/L (ref 20–29)
Calcium: 9.3 mg/dL (ref 8.7–10.2)
Chloride: 101 mmol/L (ref 96–106)
Creatinine, Ser: 0.83 mg/dL (ref 0.57–1.00)
Globulin, Total: 1.8 g/dL (ref 1.5–4.5)
Glucose: 99 mg/dL (ref 70–99)
Potassium: 4.3 mmol/L (ref 3.5–5.2)
Sodium: 138 mmol/L (ref 134–144)
Total Protein: 6 g/dL (ref 6.0–8.5)
eGFR: 91 mL/min/{1.73_m2} (ref >59)

## 2023-09-16 LAB — CBC
Hematocrit: 39.9 % (ref 34.0–46.6)
Hemoglobin: 13.2 g/dL (ref 11.1–15.9)
MCH: 29.2 pg (ref 26.6–33.0)
MCHC: 33.1 g/dL (ref 31.5–35.7)
MCV: 88 fL (ref 79–97)
Platelets: 328 10*3/uL (ref 150–450)
RBC: 4.52 x10E6/uL (ref 3.77–5.28)
RDW: 12.9 % (ref 11.7–15.4)
WBC: 8.5 10*3/uL (ref 3.4–10.8)

## 2023-09-16 LAB — LIPID PANEL
Cholesterol, Total: 243 mg/dL — ABNORMAL HIGH (ref 100–199)
HDL: 39 mg/dL — ABNORMAL LOW (ref >39)
LDL CALC COMMENT:: 6.2 ratio — ABNORMAL HIGH (ref 0.0–4.4)
LDL Chol Calc (NIH): 164 mg/dL — ABNORMAL HIGH (ref 0–99)
Triglycerides: 217 mg/dL — ABNORMAL HIGH (ref 0–149)
VLDL Cholesterol Cal: 40 mg/dL (ref 5–40)

## 2023-09-16 LAB — TSH+FREE T4
Free T4: 0.94 ng/dL (ref 0.82–1.77)
TSH: 1.76 u[IU]/mL (ref 0.450–4.500)

## 2023-09-16 LAB — VITAMIN D 25 HYDROXY (VIT D DEFICIENCY, FRACTURES): Vit D, 25-Hydroxy: 22.6 ng/mL — ABNORMAL LOW (ref 30.0–100.0)

## 2023-09-16 LAB — VITAMIN B12: Vitamin B-12: 869 pg/mL (ref 232–1245)

## 2023-09-16 LAB — MAGNESIUM: Magnesium: 1.9 mg/dL (ref 1.6–2.3)

## 2023-09-16 MED ORDER — VITAMIN D (ERGOCALCIFEROL) 1.25 MG (50000 UNIT) PO CAPS
50000.0000 [IU] | ORAL_CAPSULE | ORAL | 0 refills | Status: AC
Start: 2023-09-16 — End: ?

## 2023-09-16 NOTE — Telephone Encounter (Signed)
 Pharmacy Patient Advocate Encounter   Received notification from CoverMyMeds that prior authorization for Wegovy 0.25MG /0.5ML auto-injectors is required/requested.   Insurance verification completed.   The patient is insured through [facility] .   Per test claim: PA required; PA submitted to above mentioned insurance via CoverMyMeds Key/confirmation #/EOC [id number] Status is pending

## 2023-09-19 NOTE — Telephone Encounter (Signed)
 Pharmacy Patient Advocate Encounter  Received notification from  [facility]  that Prior Authorization for {WEGOVY  0.25MG /0.5ML has been DENIED.  Full denial letter will be uploaded to the media tab. See denial reason below.   PA #/Case ID/Reference #: [id number]

## 2023-09-22 NOTE — Telephone Encounter (Signed)
 Left detailed messaged regarding denial. LS

## 2023-09-22 NOTE — Telephone Encounter (Signed)
 Please let pt know that the form of medicaid she has does NOT cover weight loss meds.

## 2023-10-02 ENCOUNTER — Encounter: Payer: Self-pay | Admitting: Neurology

## 2023-10-03 ENCOUNTER — Other Ambulatory Visit (HOSPITAL_COMMUNITY): Payer: Self-pay

## 2023-10-09 ENCOUNTER — Other Ambulatory Visit (HOSPITAL_COMMUNITY): Payer: Self-pay

## 2023-10-13 ENCOUNTER — Telehealth: Payer: Self-pay

## 2023-10-13 ENCOUNTER — Other Ambulatory Visit (HOSPITAL_COMMUNITY): Payer: Self-pay

## 2023-10-13 ENCOUNTER — Encounter: Payer: 59 | Admitting: Obstetrics

## 2023-10-13 DIAGNOSIS — Z419 Encounter for procedure for purposes other than remedying health state, unspecified: Secondary | ICD-10-CM | POA: Diagnosis not present

## 2023-10-13 NOTE — Telephone Encounter (Signed)
 Pharmacy Patient Advocate Encounter   Received notification from Patient Pharmacy that prior authorization for Wegovy  0.25 is required/requested.   Insurance verification completed.   The patient is insured through [facility] .   Per test claim:  patient has a plan benefit exclusion. Please see encounter 09/16/23

## 2023-10-14 ENCOUNTER — Ambulatory Visit (INDEPENDENT_AMBULATORY_CARE_PROVIDER_SITE_OTHER): Admitting: Obstetrics

## 2023-10-14 ENCOUNTER — Encounter: Payer: Self-pay | Admitting: Family Medicine

## 2023-10-14 VITALS — BP 127/84 | HR 80

## 2023-10-14 DIAGNOSIS — Z48816 Encounter for surgical aftercare following surgery on the genitourinary system: Secondary | ICD-10-CM

## 2023-10-14 DIAGNOSIS — N3281 Overactive bladder: Secondary | ICD-10-CM

## 2023-10-14 DIAGNOSIS — N3941 Urge incontinence: Secondary | ICD-10-CM

## 2023-10-14 DIAGNOSIS — K5900 Constipation, unspecified: Secondary | ICD-10-CM

## 2023-10-14 MED ORDER — TROSPIUM CHLORIDE ER 60 MG PO CP24
1.0000 | ORAL_CAPSULE | Freq: Every day | ORAL | 2 refills | Status: DC
Start: 2023-10-14 — End: 2023-11-06

## 2023-10-14 NOTE — Assessment & Plan Note (Signed)
-   prior exacerbation of constipation requiring dulcolax and enema preop after trospium , repeat trial due to UUI symptoms and optimization of stool consistency - reduced suprapubic discomfort since start miralax , encouraged to continue 1 capful/day  - discussed need to optimize stool consistency to reduce straining and POP recurrence

## 2023-10-14 NOTE — Assessment & Plan Note (Signed)
-   urgency > stress s/p midurethral sling with negative CST - UDS with uroflow void, PVR . CMG showed increased sensation, normal capacity . SUI noted with DOI. VLPP 65cm H2O. Pressure flow Pdet at Qmax was 12.8 cm. PVR 40mL after catheter removed. Dyssynergic EMG with valsalva void. Encouraged pt preop to consider pelvic floor PT - Mirabegron  and Gemtesa  not covered by insurance, Trospium  worsened constipation requiring enema and dulcolax. Repeat trial of trospium  due to optimized stool consistency, discontinue if constipation returns and switch to another anti-cholinergic or 3rd line therapy

## 2023-10-14 NOTE — Progress Notes (Signed)
 [facility]  Date of Visit: 10/14/2023  History of Present Illness: [name] is a 43 y.o. female scheduled today for a post-operative visit.   Surgery: s/p Exam under anesthesia, Robotic assisted hysterectomy with bilateral salpingectomy and sacrocolpopexy, posterior repair with perineorrhaphy, Mid-urethral sling, and cystoscopy on 09/01/23  She passed her postoperative void trial on POD#3.   Postoperative course uncomplicated.   Today she reports urgency urinary incontinence Managed with 2 pads/day for night time leakage, denies sleep apnea diagnosis Drinks until bedtime Day time voids 10.  Nocturia: 2-3 (baseline 4-6) times per night to void  Gemtesa  with relief but cost prohibitive,  Denies SUI, vaginal discharge or bleeding.  Denies sexual activity.   UTI in the last 6 weeks? No  Pain? No  She has returned to her normal activity (except for postop restrictions) Vaginal bulge? No  Stress incontinence: No  Urgency/frequency: Yes  Urge incontinence: Yes  Voiding dysfunction: No  Bowel issues: bowel movement with Bristol IV stool every 2 days on miralax  1 capful/day  Subjective Success: Do you usually have a bulge or something falling out that you can see or feel in the vaginal area? No  Retreatment Success: Any retreatment with surgery or pessary for any compartment? No   Pathology results:   "FINAL MICROSCOPIC DIAGNOSIS:   A. UTERUS AND CERVIX, WITH BILATERAL FALLOPIAN TUBES, HYSTERECTOMY:  -  Unremarkable cervix, negative for dysplasia.  -  Attenuated endometrial epithelium with pseudodecidualized stroma  suggestive of steroid effect, negative for atypia/hyperplasia.  -  Unremarkable myometrium.  -  Unremarkable bilateral Fallopian tubes."  Medications: She has a current medication list which includes the following prescription(s): acetaminophen , albuterol , amlodipine , aripiprazole, eq allergy relief (cetirizine ), duloxetine, hydroxyzine , ibuprofen ,  lorazepam , metoprolol  succinate, ondansetron , oxycodone , pantoprazole , polyethylene glycol powder, triamcinolone  cream, trospium  chloride, vitamin d  (ergocalciferol ), semaglutide -weight management, [START ON 11/12/2023] semaglutide -weight management, [START ON 12/11/2023] semaglutide -weight management, and [START ON 01/09/2024] semaglutide -weight management, and the following Facility-Administered Medications: levonorgestrel .   Allergies: Patient is allergic to penicillins, sulfa antibiotics, and azithromycin .   Physical Exam: BP 127/84   Pulse 80   Abdomen: soft, non-tender, without masses or organomegaly Laparoscopic and suprapubic Incisions: healing well, no significant drainage, no dehiscence, no significant erythema.  Pelvic Examination: Vagina: Incisions healing well. Sutures are present at the perineum and there is not granulation tissue. No tenderness along the anterior or posterior vagina. No apical tenderness. No pelvic masses. Suture visible at vaginal apex with no mesh.  POP-Q: POP-Q  -3                                            Aa   -3                                           Ba  -8                                              C   2  Gh  5                                            Pb  9                                            tvl   -2                                            Ap  -2                                            Bp                                                 D   Negative CST ---------------------------------------------------------  Assessment and Plan:  1. OAB (overactive bladder)   2. Constipation, unspecified constipation type   3. Urge urinary incontinence    OAB (overactive bladder) -     Trospium  Chloride ER; Take 1 capsule (60 mg total) by mouth daily.  Dispense: 30 capsule; Refill: 2  Constipation, unspecified constipation type Assessment & Plan: - prior exacerbation of constipation  requiring dulcolax and enema preop after trospium , repeat trial due to UUI symptoms and optimization of stool consistency - reduced suprapubic discomfort since start miralax , encouraged to continue 1 capful/day  - discussed need to optimize stool consistency to reduce straining and POP recurrence    Urge urinary incontinence Assessment & Plan: - urgency > stress s/p midurethral sling with negative CST - UDS with uroflow void, PVR . CMG showed increased sensation, normal capacity . SUI noted with DOI. VLPP 65cm H2O. Pressure flow Pdet at Qmax was 12.8 cm. PVR 40mL after catheter removed. Dyssynergic EMG with valsalva void. Encouraged pt preop to consider pelvic floor PT - Mirabegron  and Gemtesa  not covered by insurance, Trospium  worsened constipation requiring enema and dulcolax. Repeat trial of trospium  due to optimized stool consistency, discontinue if constipation returns and switch to another anti-cholinergic or 3rd line therapy   - Pathology results were reviewed with the patient today and she verbalized understanding that the results were benign.  - repeat pelvic exam in 2 months  - Discussed avoidance of heavy lifting and straining long term to reduce the risk of recurrence.   All questions answered.   Return in about 2 months (around 12/14/2023).

## 2023-10-14 NOTE — Patient Instructions (Signed)
 Continue miralax  daily to avoid straining.   Resume Trospium , stop if you experience worsening of constipation.  For anticholinergic medications, we discussed the potential side effects of anticholinergics including dry eyes, dry mouth, constipation, rare risks of cognitive impairment and urinary retention.  It can take a month to start working so give it time, but if you have bothersome side effects call sooner and we can try a different medication.  Call us  if you have trouble filling the prescription or if it's not covered by your insurance.  Follow-up in 8 weeks to see how your symptoms are responding to treatment.

## 2023-10-16 ENCOUNTER — Other Ambulatory Visit: Payer: Self-pay

## 2023-10-16 DIAGNOSIS — Z8632 Personal history of gestational diabetes: Secondary | ICD-10-CM

## 2023-10-16 DIAGNOSIS — Z8249 Family history of ischemic heart disease and other diseases of the circulatory system: Secondary | ICD-10-CM

## 2023-10-16 DIAGNOSIS — I10 Essential (primary) hypertension: Secondary | ICD-10-CM

## 2023-10-16 DIAGNOSIS — E782 Mixed hyperlipidemia: Secondary | ICD-10-CM

## 2023-10-16 DIAGNOSIS — R251 Tremor, unspecified: Secondary | ICD-10-CM

## 2023-10-16 MED ORDER — SEMAGLUTIDE-WEIGHT MANAGEMENT 1 MG/0.5ML ~~LOC~~ SOAJ: 1.0000 mg | SUBCUTANEOUS | 0 refills | Status: AC

## 2023-10-16 MED ORDER — SEMAGLUTIDE-WEIGHT MANAGEMENT 2.4 MG/0.75ML ~~LOC~~ SOAJ: 2.4000 mg | SUBCUTANEOUS | 12 refills | Status: AC

## 2023-10-16 MED ORDER — SEMAGLUTIDE-WEIGHT MANAGEMENT 0.5 MG/0.5ML ~~LOC~~ SOAJ: 0.5000 mg | SUBCUTANEOUS | 0 refills | Status: AC

## 2023-10-16 MED ORDER — SEMAGLUTIDE-WEIGHT MANAGEMENT 1.7 MG/0.75ML ~~LOC~~ SOAJ: 1.7000 mg | SUBCUTANEOUS | 0 refills | Status: AC

## 2023-10-17 ENCOUNTER — Other Ambulatory Visit (HOSPITAL_COMMUNITY): Payer: Self-pay

## 2023-10-17 ENCOUNTER — Telehealth: Payer: Self-pay

## 2023-10-17 NOTE — Telephone Encounter (Signed)
 Pharmacy Patient Advocate Encounter  Received notification from [facility] that Prior Authorization for Wegovy  0.5MG /0.5ML auto-injectors  has been APPROVED from 10/17/23 to 04/14/24. Ran test claim, Copay is $4. This test claim was processed through [facility]- copay amounts may vary at other pharmacies due to pharmacy/plan contracts, or as the patient moves through the different stages of their insurance plan.   PA #/Case ID/Reference #: [id number]

## 2023-10-17 NOTE — Telephone Encounter (Signed)
 Pharmacy Patient Advocate Encounter   Received notification from CoverMyMeds that prior authorization for Wegovy  0.5MG /0.5ML auto-injectors is required/requested.   Insurance verification completed.   The patient is insured through [facility] [place] .   Per test claim: PA required; PA submitted to above mentioned insurance via CoverMyMeds Key/confirmation #/EOC [id number] Status is pending

## 2023-10-20 ENCOUNTER — Encounter: Payer: Self-pay | Admitting: Obstetrics

## 2023-10-22 ENCOUNTER — Ambulatory Visit: Admitting: Family Medicine

## 2023-10-22 NOTE — Progress Notes (Deleted)
 Assessment/Plan:   Parkinsonism  -I had a long counseling session with the patient today.  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  I explained that one clinically cannot tell the difference between idiopathic parkinsons disease and secondary parkinsonism from medication.  I also explained that even if one is able to get off of the medication, it can take up to 6 months to clinically definitively know if this is idiopathic parkinsons disease.  I did not advise that the patient go off of medication, as this needs to be discussed with the patients prescribing physician.  I did, however, tell the patient that the longer one is on the medication, the worse the symptoms can get.  The patient is to make an appointment with *** to discuss what I have discussed with her.  ***  Subjective:   [name] was seen today in the movement disorders clinic for neurologic consultation at the request of Eliodoro Guerin, DO.  The consultation is for the evaluation of left hand tremor.  Apparently, patient psychiatry provider noticed left hand tremor and sent the patient to her primary care physician.  Patient's mental health provider is Vicenta Graft, [place].  Patient's primary care physician did not notice rest tremor, but rather "fine tremor noted on the left hand when raised but not present when in seated, resting position."  Primary care felt that patient likely had essential tremor and noted that the patient was already on a beta-blocker and calcium  channel blocker.   Specific Symptoms:  Tremor: {yes no:314532} left hand tremor noted 2 months ago. Family hx of similar:  {yes no:314532} Voice: *** Sleep: ***  Vivid Dreams:  {yes no:314532}  Acting out dreams:  {yes no:314532} Wet Pillows: {yes no:314532} Postural symptoms:  {yes no:314532}  Falls?  {yes no:314532} Bradykinesia symptoms: {parkinson brady:18041} Loss of smell:  {yes no:314532} Loss of  taste:  {yes no:314532} Urinary Incontinence:  {yes no:314532} Difficulty Swallowing:  {yes no:314532} Handwriting, micrographia: {yes no:314532} Trouble with ADL's:  {yes no:314532}  Trouble buttoning clothing: {yes no:314532} Depression:  {yes no:314532} Memory changes:  {yes no:314532} Hallucinations:  {yes no:314532}  visual distortions: {yes no:314532} N/V:  {yes no:314532} Lightheaded:  {yes no:314532}  Syncope: {yes no:314532} Diplopia:  {yes no:314532} Dyskinesia:  {yes no:314532} Prior exposure to reglan/antipsychotics: {yes no:314532}  Neuroimaging of the brain has *** previously been performed.  It *** available for my review today.  PREVIOUS MEDICATIONS: {Parkinson's RX:18200}  ALLERGIES:   Allergies  Allergen Reactions   Penicillins Hives and Itching   Sulfa Antibiotics Hives and Itching   Azithromycin  Anxiety and Palpitations    Chest pain and dizziness     CURRENT MEDICATIONS:  No outpatient medications have been marked as taking for the 10/28/23 encounter (Appointment) with Iliya Spivack, [place], DO.   Current Facility-Administered Medications for the 10/28/23 encounter (Appointment) with Gearld Kerstein, [place]umbling, DO  Medication   levonorgestrel  (MIRENA ) 20 MCG/24HR IUD 1 each     Objective:   VITALS:  There were no vitals filed for this visit.  GEN:  The patient appears stated age and is in NAD. HEENT:  Normocephalic, atraumatic.  The mucous membranes are moist. The superficial temporal arteries are without ropiness or tenderness. CV:  RRR Lungs:  CTAB Neck/HEME:  There are no carotid bruits bilaterally.  Neurological examination:  Orientation: The patient is alert and oriented x3.  Cranial nerves: There is good facial symmetry. Extraocular muscles are intact. The  visual fields are full to confrontational testing. The speech is fluent and clear. Soft palate rises symmetrically and there is no tongue deviation. Hearing is intact to conversational tone. Sensation:  Sensation is intact to light and pinprick throughout (facial, trunk, extremities). Vibration is intact at the bilateral big toe. There is no extinction with double simultaneous stimulation. There is no sensory dermatomal level identified. Motor: Strength is 5/5 in the bilateral upper and lower extremities.   Shoulder shrug is equal and symmetric.  There is no pronator drift. Deep tendon reflexes: Deep tendon reflexes are 2/4 at the bilateral biceps, triceps, brachioradialis, patella and achilles. Plantar responses are downgoing bilaterally.  Movement examination: Tone: There is ***tone in the bilateral upper extremities.  The tone in the lower extremities is ***.  Abnormal movements: *** Coordination:  There is *** decremation with RAM's, *** Gait and Station: The patient has *** difficulty arising out of a deep-seated chair without the use of the hands. The patient's stride length is ***.  The patient has a *** pull test.     I have reviewed and interpreted the following labs independently   Chemistry      Component Value Date/Time   NA 138 09/15/2023 1042   K 4.3 09/15/2023 1042   CL 101 09/15/2023 1042   CO2 23 09/15/2023 1042   BUN 16 09/15/2023 1042   CREATININE 0.83 09/15/2023 1042      Component Value Date/Time   CALCIUM  9.3 09/15/2023 1042   ALKPHOS 83 09/15/2023 1042   AST 14 09/15/2023 1042   ALT 13 09/15/2023 1042   BILITOT <0.2 09/15/2023 1042      Lab Results  Component Value Date   TSH 1.760 09/15/2023   Lab Results  Component Value Date   WBC 8.5 09/15/2023   HGB 13.2 09/15/2023   HCT 39.9 09/15/2023   MCV 88 09/15/2023   PLT 328 09/15/2023     Total time spent on today's visit was ***60 minutes, including both face-to-face time and nonface-to-face time.  Time included that spent on review of records (prior notes available to me/labs/imaging if pertinent), discussing treatment and goals, answering patient's questions and coordinating care.  Cc:  Eliodoro Guerin, DO

## 2023-10-24 ENCOUNTER — Telehealth: Payer: Self-pay

## 2023-10-24 NOTE — Telephone Encounter (Signed)
 Copied from CRM [id number]. Topic: Clinical - Medication Question >> Oct 24, 2023  1:23 PM Carla L wrote: Reason for CRM: [facility] received rx for Wegovy . Merritt Ables states the rx they have is for the 1mg  but they do not have any history of filling the lower doses.   Requesting call back to discuss, [phone] (clinical clarification line) Reference number: [id number]

## 2023-10-24 NOTE — Telephone Encounter (Signed)
 Called left detailed message for pt to cb   When pt calls back please relay last message

## 2023-10-24 NOTE — Telephone Encounter (Signed)
 Pharmacy has tried to reach out to pr regarding meds   PA was approved and pt never requested them to be filled , they need to know if pt is still wanting this

## 2023-10-28 ENCOUNTER — Encounter: Payer: Self-pay | Admitting: Neurology

## 2023-10-28 ENCOUNTER — Ambulatory Visit: Admitting: Neurology

## 2023-10-30 ENCOUNTER — Other Ambulatory Visit (HOSPITAL_COMMUNITY): Payer: Self-pay

## 2023-10-30 ENCOUNTER — Telehealth: Payer: Self-pay

## 2023-10-30 NOTE — Telephone Encounter (Signed)
 Pharmacy Patient Advocate Encounter   Received notification from CoverMyMeds that prior authorization for Wegovy  0.5 is required/requested.   Insurance verification completed.   The patient is insured through [facility] . Current prior authorization expires 04/14/24   Per test claim: The current 28 day co-pay is, $4.00.  No PA needed at this time. This test claim was processed through [facility]- copay amounts may vary at other pharmacies due to pharmacy/plan contracts, or as the patient moves through the different stages of their insurance plan.

## 2023-10-30 NOTE — Telephone Encounter (Signed)
Left detailed message per dpr  

## 2023-11-06 ENCOUNTER — Other Ambulatory Visit: Payer: Self-pay | Admitting: Obstetrics and Gynecology

## 2023-11-06 MED ORDER — SOLIFENACIN SUCCINATE 5 MG PO TABS: 5.0000 mg | ORAL_TABLET | Freq: Every day | ORAL | 0 refills | Status: DC

## 2023-11-13 DIAGNOSIS — Z419 Encounter for procedure for purposes other than remedying health state, unspecified: Secondary | ICD-10-CM | POA: Diagnosis not present

## 2023-12-01 ENCOUNTER — Ambulatory Visit (INDEPENDENT_AMBULATORY_CARE_PROVIDER_SITE_OTHER): Admitting: Obstetrics

## 2023-12-01 ENCOUNTER — Encounter: Payer: Self-pay | Admitting: Obstetrics

## 2023-12-01 VITALS — BP 116/82 | HR 87

## 2023-12-01 DIAGNOSIS — N3941 Urge incontinence: Secondary | ICD-10-CM

## 2023-12-01 DIAGNOSIS — N3281 Overactive bladder: Secondary | ICD-10-CM

## 2023-12-01 MED ORDER — TOLTERODINE TARTRATE ER 2 MG PO CP24: 2.0000 mg | ORAL_CAPSULE | Freq: Every day | ORAL | 3 refills | Status: DC

## 2023-12-01 NOTE — Patient Instructions (Addendum)
 Take tolteradine 1 tab daily.   Continue fluid management.   Start bladder diary to assess night time urinary output and consider sleep study to rule out sleep apnea.  For refractory OAB we reviewed the procedure for intravesical Botox injection with cystoscopy in the office and reviewed the risks, benefits and alternatives of treatment including but not limited to infection, need for self-catheterization and need for repeat therapy.  We discussed that there is a 5-15% chance of needing to catheterize with Botox and that this usually resolves in a few months; however can persist for longer periods of time.  Typically Botox injections would need to be repeated every 3-12 months since this is not a permanent therapy.   We discussed the role of sacral neuromodulation and how it works. It requires a test phase, and documentation of bladder function via diary. After a successful test period, a permanent wire and generator are placed in the OR. The battery lasts 5 years on average and would need to be replaced surgically.  The goal of this therapy is at least a 50% improvement in symptoms. It is NOT realistic to expect a 100% cure.  We reviewed the fact that about 30% of patients fail the test phase and are not candidates for permanent generator placement.  We discussed the risk of infection and that the patient would not be able to get an MRI once the device is placed. There are two companies that provide this therapy: Medtronic and [employer]. [employer]' product is new and is similar to Medtronic's, but has advantages of a smaller and rechargeable battery and being able to have an MRI with the implant. For all procedures, we discussed risks of bleeding, infection, damage to surrounding organs including bowel, bladder, blood vessels, ureters and nerves, need for further surgery, risk of postoperative urinary incontinence or retention with need to catheterize, recurrent prolapse, numbness and weakness at any body site,  buttock pain, and the rarer risks of blood clot, heart attack, pneumonia, death.    We also discussed the role of percutaneous tibial nerve stimulation and how it works.  She understands it requires 12 weekly visits for temporary neuromodulation of the sacral nerve roots via the tibial nerve and that she may then require continued tapered treatment.  She will return for the procedure. All questions were answered.

## 2023-12-01 NOTE — Assessment & Plan Note (Signed)
-   urgency > stress s/p midurethral sling 09/01/23 with negative CST - UDS with uroflow void, PVR . CMG showed increased sensation, normal capacity . SUI noted with DOI. VLPP 65cm H2O. Pressure flow Pdet at Qmax was 12.8 cm. PVR 40mL after catheter removed. Dyssynergic EMG with valsalva void. Encouraged pt preop to consider pelvic floor PT - Mirabegron  and Gemtesa  not covered by insurance - Trospium  worsened constipation requiring enema and dulcolax We discussed the symptoms of overactive bladder (OAB), which include urinary urgency, urinary frequency, nocturia, with or without urge incontinence.  While we do not know the exact etiology of OAB, several treatment options exist. We discussed management including behavioral therapy (decreasing bladder irritants, urge suppression strategies, timed voids, bladder retraining), physical therapy, medication; for refractory cases posterior tibial nerve stimulation, sacral neuromodulation, and intravesical botulinum toxin injection.  For anticholinergic medications, we discussed the potential side effects of anticholinergics including dry eyes, dry mouth, constipation, cognitive impairment and urinary retention. For Beta-3 agonist medication, we discussed the potential side effect of elevated blood pressure which is more likely to occur in individuals with uncontrolled hypertension. - pt desires to continue trial of medication, Rx tolteradine 2mg  LA nightly. Stop if it worsens constipation For refractory OAB we reviewed the procedure for intravesical Botox injection with cystoscopy in the office and reviewed the risks, benefits and alternatives of treatment including but not limited to infection, need for self-catheterization and need for repeat therapy.  We discussed that there is a 5-15% chance of needing to catheterize with Botox and that this usually resolves in a few months; however can persist for longer periods of time.  Typically Botox injections  would need to be repeated every 3-12 months since this is not a permanent therapy.   We discussed the role of sacral neuromodulation and how it works. It requires a test phase, and documentation of bladder function via diary. After a successful test period, a permanent wire and generator are placed in the OR. The battery lasts 5 years on average and would need to be replaced surgically.  The goal of this therapy is at least a 50% improvement in symptoms. It is NOT realistic to expect a 100% cure.  We reviewed the fact that about 30% of patients fail the test phase and are not candidates for permanent generator placement.  We discussed the risk of infection and that the patient would not be able to get an MRI once the device is placed. There are two companies that provide this therapy: Medtronic and [employer]. [employer]' product is new and is similar to Medtronic's, but has advantages of a smaller and rechargeable battery and being able to have an MRI with the implant. For all procedures, we discussed risks of bleeding, infection, damage to surrounding organs including bowel, bladder, blood vessels, ureters and nerves, need for further surgery, risk of postoperative urinary incontinence or retention with need to catheterize, recurrent prolapse, numbness and weakness at any body site, buttock pain, and the rarer risks of blood clot, heart attack, pneumonia, death.    We also discussed the role of percutaneous tibial nerve stimulation and how it works.  She understands it requires 12 weekly visits for temporary neuromodulation of the sacral nerve roots via the tibial nerve and that she may then require continued tapered treatment.

## 2023-12-01 NOTE — Progress Notes (Signed)
 [facility]  Date of Visit: 12/01/2023  History of Present Illness: [name] is a 43 y.o. female scheduled today for a 3 month post-operative visit.   Surgery: s/p Exam under anesthesia, Robotic assisted hysterectomy with bilateral salpingectomy and sacrocolpopexy, posterior repair with perineorrhaphy, Mid-urethral sling, and cystoscopy on 09/01/23  She passed her postoperative void trial on POD#3.   Postoperative course uncomplicated.   Resumed Trospium  daily for 2 weeks, stopped due to constipation with reduction of daytime frequency.  UUI at night x 2/night, denies daytime UUI.  Managed with 2 pads/day for night time leakage, denies sleep apnea diagnosis Drinks until bedtime Day time voids 3-4x/day on trospium  (baseline 10).  Nocturia: 2-3 (baseline 4-6) times per night to void  Gemtesa  with relief but cost prohibitive Denies SUI, vaginal discharge or bleeding.  Resumed sexual activity without pain or discomfort   UTI in the last 6 weeks? No  Pain? No  She has returned to her normal activity (except for postop restrictions) Vaginal bulge? No  Stress incontinence: No  Urgency/frequency: Yes  Urge incontinence: Yes  Voiding dysfunction: No , unclear about incomplete emptying due to urinary frequency Bowel issues: bowel movement with Bristol IV stool every 2 days on miralax  1 capful/day  Subjective Success: Do you usually have a bulge or something falling out that you can see or feel in the vaginal area? No  Retreatment Success: Any retreatment with surgery or pessary for any compartment? No   Pathology results:   FINAL MICROSCOPIC DIAGNOSIS:   A. UTERUS AND CERVIX, WITH BILATERAL FALLOPIAN TUBES, HYSTERECTOMY:  -  Unremarkable cervix, negative for dysplasia.  -  Attenuated endometrial epithelium with pseudodecidualized stroma  suggestive of steroid effect, negative for atypia/hyperplasia.  -  Unremarkable myometrium.  -  Unremarkable bilateral Fallopian  tubes.  Medications: She has a current medication list which includes the following prescription(s): acetaminophen , albuterol , amlodipine , aripiprazole, eq allergy relief (cetirizine ), duloxetine, hydroxyzine , ibuprofen , lorazepam , metoprolol  succinate, ondansetron , oxycodone , pantoprazole , polyethylene glycol powder, semaglutide -weight management, [START ON 12/11/2023] semaglutide -weight management, [START ON 01/09/2024] semaglutide -weight management, tolterodine, triamcinolone  cream, and vitamin d  (ergocalciferol ), and the following Facility-Administered Medications: levonorgestrel .   Allergies: Patient is allergic to penicillins, sulfa antibiotics, and azithromycin .   Physical Exam: BP 116/82   Pulse 87   Abdomen: soft, non-tender, without masses or organomegaly Laparoscopic and suprapubic Incisions: healing well, no significant drainage, no dehiscence, no significant erythema.  Pelvic Examination: Vagina: Incisions well healed. Sutures are not present at the perineum and there is not granulation tissue. No tenderness along the anterior or posterior vagina. No apical tenderness. No pelvic masses. Vaginal apex with no mesh exposure by visualization or palpation.  POP-Q: POP-Q  -3                                            Aa   -3                                           Ba  -9                                              C  2                                            Gh  5                                            Pb  9                                            tvl   -3                                            Ap  -3                                            Bp                                                 D   Negative CST ---------------------------------------------------------  Assessment and Plan:  1. OAB (overactive bladder)   2. Urge urinary incontinence     OAB (overactive bladder) -     Tolterodine Tartrate ER; Take 1 capsule (2 mg total) by mouth  daily.  Dispense: 30 capsule; Refill: 3  Urge urinary incontinence Assessment & Plan: - urgency > stress s/p midurethral sling 09/01/23 with negative CST - UDS with uroflow void, PVR . CMG showed increased sensation, normal capacity . SUI noted with DOI. VLPP 65cm H2O. Pressure flow Pdet at Qmax was 12.8 cm. PVR 40mL after catheter removed. Dyssynergic EMG with valsalva void. Encouraged pt preop to consider pelvic floor PT - Mirabegron  and Gemtesa  not covered by insurance - Trospium  worsened constipation requiring enema and dulcolax We discussed the symptoms of overactive bladder (OAB), which include urinary urgency, urinary frequency, nocturia, with or without urge incontinence.  While we do not know the exact etiology of OAB, several treatment options exist. We discussed management including behavioral therapy (decreasing bladder irritants, urge suppression strategies, timed voids, bladder retraining), physical therapy, medication; for refractory cases posterior tibial nerve stimulation, sacral neuromodulation, and intravesical botulinum toxin injection.  For anticholinergic medications, we discussed the potential side effects of anticholinergics including dry eyes, dry mouth, constipation, cognitive impairment and urinary retention. For Beta-3 agonist medication, we discussed the potential side effect of elevated blood pressure which is more likely to occur in individuals with uncontrolled hypertension. - pt desires to continue trial of medication, Rx tolteradine 2mg  LA nightly. Stop if it worsens constipation For refractory OAB we reviewed the procedure for intravesical Botox injection with cystoscopy in the office and reviewed the risks, benefits and alternatives of treatment including but not limited to infection, need for self-catheterization and need for repeat therapy.  We discussed that there is a 5-15% chance of needing to catheterize with Botox  and that this usually resolves in  a few months; however can persist for longer periods of time.  Typically Botox injections would need to be repeated every 3-12 months since this is not a permanent therapy.   We discussed the role of sacral neuromodulation and how it works. It requires a test phase, and documentation of bladder function via diary. After a successful test period, a permanent wire and generator are placed in the OR. The battery lasts 5 years on average and would need to be replaced surgically.  The goal of this therapy is at least a 50% improvement in symptoms. It is NOT realistic to expect a 100% cure.  We reviewed the fact that about 30% of patients fail the test phase and are not candidates for permanent generator placement.  We discussed the risk of infection and that the patient would not be able to get an MRI once the device is placed. There are two companies that provide this therapy: Medtronic and [employer]. [employer]' product is new and is similar to Medtronic's, but has advantages of a smaller and rechargeable battery and being able to have an MRI with the implant. For all procedures, we discussed risks of bleeding, infection, damage to surrounding organs including bowel, bladder, blood vessels, ureters and nerves, need for further surgery, risk of postoperative urinary incontinence or retention with need to catheterize, recurrent prolapse, numbness and weakness at any body site, buttock pain, and the rarer risks of blood clot, heart attack, pneumonia, death.    We also discussed the role of percutaneous tibial nerve stimulation and how it works.  She understands it requires 12 weekly visits for temporary neuromodulation of the sacral nerve roots via the tibial nerve and that she may then require continued tapered treatment.     - Discussed avoidance of heavy lifting and straining long term to reduce the risk of recurrence.   All questions answered.   Return in about 3 months (around 03/02/2024).

## 2023-12-06 ENCOUNTER — Other Ambulatory Visit: Payer: Self-pay | Admitting: Family Medicine

## 2023-12-06 DIAGNOSIS — E559 Vitamin D deficiency, unspecified: Secondary | ICD-10-CM

## 2023-12-08 NOTE — Telephone Encounter (Signed)
 Per Dr KANDICE result notes pt was to start otc Vit D once she completed the prescription.

## 2023-12-13 DIAGNOSIS — Z419 Encounter for procedure for purposes other than remedying health state, unspecified: Secondary | ICD-10-CM | POA: Diagnosis not present

## 2024-01-13 DIAGNOSIS — Z419 Encounter for procedure for purposes other than remedying health state, unspecified: Secondary | ICD-10-CM | POA: Diagnosis not present

## 2024-01-16 ENCOUNTER — Ambulatory Visit: Admitting: Family Medicine

## 2024-03-04 ENCOUNTER — Ambulatory Visit: Admitting: Obstetrics

## 2024-03-22 ENCOUNTER — Encounter: Payer: Self-pay | Admitting: Obstetrics

## 2024-03-22 ENCOUNTER — Ambulatory Visit: Admitting: Obstetrics

## 2024-03-22 VITALS — BP 123/87 | HR 62

## 2024-03-22 DIAGNOSIS — N3941 Urge incontinence: Secondary | ICD-10-CM | POA: Diagnosis not present

## 2024-03-22 DIAGNOSIS — K5900 Constipation, unspecified: Secondary | ICD-10-CM | POA: Diagnosis not present

## 2024-03-22 MED ORDER — TROSPIUM CHLORIDE ER 60 MG PO CP24: 1.0000 | ORAL_CAPSULE | Freq: Every day | ORAL | 2 refills | Status: AC

## 2024-03-22 NOTE — Patient Instructions (Addendum)
 Resume Trospium  once a day.   Continue miralax  and increase dose as needed if you experience change in bowel symptoms or constipation.   Please visit the website below to sign up for an account. We will need an email address to send along with your prescription to verify your prescription once you have signed up.  [email or web address]  Trospium  Chloride ER Capsule Extended Release  60mg   30 count $31.11  Or    Trospium  Chloride (2 times a day dosing) Tablet  20mg   60 count  $14.03   We also discussed the role of percutaneous tibial nerve stimulation and how it works.  She understands it requires 12 weekly visits for temporary neuromodulation of the sacral nerve roots via the tibial nerve and that she may then require continued tapered treatment.

## 2024-03-22 NOTE — Assessment & Plan Note (Signed)
-   prior exacerbation of constipation requiring dulcolax and enema preop after trospium , previously afraid to resume. Now desires repeat trial due to UUI symptoms 1x/month  - discussed resume titration of miralax  to optimize of stool consistency if she experiences constipation - reduced suprapubic discomfort since starting miralax , encouraged to continue 1 capful/day  - discussed need to optimize stool consistency to reduce straining and POP recurrence

## 2024-03-22 NOTE — Progress Notes (Signed)
 [facility]  Date of Visit: 03/22/2024  History of Present Illness: [name] is a 43 y.o. female scheduled today for follow-up with urgency urinary incontinence and constipation.   Surgery: s/p Exam under anesthesia, Robotic assisted hysterectomy with bilateral salpingectomy and sacrocolpopexy, posterior repair with perineorrhaphy, Mid-urethral sling, and cystoscopy on 09/01/23  She passed her postoperative void trial on POD#3.   Postoperative course uncomplicated.   Did not resume Trospium  due to concern for constipation. Desires to resume  Previously resumed Trospium  daily for 2 weeks, stopped due to constipation with reduction of daytime frequency.  UUI at night x 2/night reduced to 1x/night due to fluid reduction. Reports daytime UUI 1x/month (total of 3 since last visit) Stopped pad use at night due to resolution after fluid management and stopped drinking fluids until bedtime. Previously 2 pads/day for night time leakage. Denies sleep apnea diagnosis Day time voids 5-6x/day, previously 3-4x/day on trospium  (baseline 10).  Nocturia: 0 down from 2-3 (baseline 4-6)x/night to void  Gemtesa  and mirabegron  with relief but cost prohibitive Denies SUI, vaginal discharge or bleeding.  Continues sexual activity without pain or discomfort   UTI in the last 6 weeks? No  Pain? No  She has continued her normal activity (except for postop restrictions) Vaginal bulge? No  Stress incontinence: No  Urgency/frequency: Yes  Urge incontinence: Yes  Voiding dysfunction: No , unclear about incomplete emptying due to urinary frequency Bowel issues: bowel movement with Bristol IV stool every 2 days on miralax  1 capful/day  Subjective Success: Do you usually have a bulge or something falling out that you can see or feel in the vaginal area? No  Retreatment Success: Any retreatment with surgery or pessary for any compartment? No    Medications: She has a current medication list which  includes the following prescription(s): acetaminophen , albuterol , amlodipine , aripiprazole, eq allergy relief (cetirizine ), duloxetine, hydroxyzine , ibuprofen , lorazepam , metoprolol  succinate, ondansetron , oxycodone , pantoprazole , polyethylene glycol powder, semaglutide -weight management, triamcinolone  cream, trospium  chloride, and vitamin d  (ergocalciferol ), and the following Facility-Administered Medications: levonorgestrel .   Allergies: Patient is allergic to penicillins, sulfa antibiotics, and azithromycin .   Physical Exam: BP 123/87   Pulse 62   ---------------------------------------------------------  Assessment and Plan:  1. Urge urinary incontinence   2. Constipation, unspecified constipation type      Urge urinary incontinence Assessment & Plan: - urgency > stress s/p midurethral sling 09/01/23 with negative CST - UUI 1x/month - UDS with uroflow void, PVR . CMG showed increased sensation, normal capacity . SUI noted with DOI. VLPP 65cm H2O. Pressure flow Pdet at Qmax was 12.8 cm. PVR 40mL after catheter removed. Dyssynergic EMG with valsalva void. Encouraged pt preop to consider pelvic floor PT - Mirabegron  and Gemtesa  with relief but not covered by insurance, can try resending Rx in 2026 to reassess insurance coverage - Trospium  worsened constipation requiring enema and dulcolax, pt desires to resume with Rx sent due to UUI symptoms. For anticholinergic medications, we discussed the potential side effects of anticholinergics including dry eyes, dry mouth, constipation, cognitive impairment and urinary retention. - improved night time symptoms with fluid management - We discussed management including behavioral therapy (decreasing bladder irritants, urge suppression strategies, timed voids, bladder retraining), physical therapy, medication; for refractory cases posterior tibial nerve stimulation, sacral neuromodulation, and intravesical botulinum toxin injection.  -  denies use of tolteradine 2mg  LA nightly - For refractory OAB we reviewed the procedure for intravesical Botox injection with cystoscopy in the office and reviewed the risks, benefits and alternatives  of treatment including but not limited to infection, need for self-catheterization and need for repeat therapy.  We discussed that there is a 5-15% chance of needing to catheterize with Botox and that this usually resolves in a few months; however can persist for longer periods of time.  Typically Botox injections would need to be repeated every 3-12 months since this is not a permanent therapy.   We discussed the role of sacral neuromodulation and how it works. It requires a test phase, and documentation of bladder function via diary. After a successful test period, a permanent wire and generator are placed in the OR. The battery lasts 5 years on average and would need to be replaced surgically.  The goal of this therapy is at least a 50% improvement in symptoms. It is NOT realistic to expect a 100% cure.  We reviewed the fact that about 30% of patients fail the test phase and are not candidates for permanent generator placement.  We discussed the risk of infection and that the patient would not be able to get an MRI once the device is placed. There are two companies that provide this therapy: [employer] and [employer]. [employer]' product is new and is similar to [employer]'s, but has advantages of a smaller and rechargeable battery and being able to have an MRI with the implant. For all procedures, we discussed risks of bleeding, infection, damage to surrounding organs including bowel, bladder, blood vessels, ureters and nerves, need for further surgery, risk of postoperative urinary incontinence or retention with need to catheterize, recurrent prolapse, numbness and weakness at any body site, buttock pain, and the rarer risks of blood clot, heart attack, pneumonia, death.    We also discussed the role of percutaneous  tibial nerve stimulation and how it works.  She understands it requires 12 weekly visits for temporary neuromodulation of the sacral nerve roots via the tibial nerve and that she may then require continued tapered treatment.  - if refractory symptoms or return of constipation, pt desires to proceed with PTNS  Orders: -     Trospium  Chloride ER; Take 1 capsule (60 mg total) by mouth daily.  Dispense: 30 capsule; Refill: 2  Constipation, unspecified constipation type Assessment & Plan: - prior exacerbation of constipation requiring dulcolax and enema preop after trospium , previously afraid to resume. Now desires repeat trial due to UUI symptoms 1x/month  - discussed resume titration of miralax  to optimize of stool consistency if she experiences constipation - reduced suprapubic discomfort since starting miralax , encouraged to continue 1 capful/day  - discussed need to optimize stool consistency to reduce straining and POP recurrence     - Discussed avoidance of heavy lifting and straining long term to reduce the risk of recurrence.   All questions answered.   Return in about 3 months (around 06/22/2024).  If medication is cost prohibitive or return of constipation, pt to message or call to schedule PTNS.

## 2024-03-22 NOTE — Assessment & Plan Note (Signed)
-   urgency > stress s/p midurethral sling 09/01/23 with negative CST - UUI 1x/month - UDS with uroflow void, PVR . CMG showed increased sensation, normal capacity . SUI noted with DOI. VLPP 65cm H2O. Pressure flow Pdet at Qmax was 12.8 cm. PVR 40mL after catheter removed. Dyssynergic EMG with valsalva void. Encouraged pt preop to consider pelvic floor PT - Mirabegron  and Gemtesa  with relief but not covered by insurance, can try resending Rx in 2026 to reassess insurance coverage - Trospium  worsened constipation requiring enema and dulcolax, pt desires to resume with Rx sent due to UUI symptoms. For anticholinergic medications, we discussed the potential side effects of anticholinergics including dry eyes, dry mouth, constipation, cognitive impairment and urinary retention. - improved night time symptoms with fluid management - We discussed management including behavioral therapy (decreasing bladder irritants, urge suppression strategies, timed voids, bladder retraining), physical therapy, medication; for refractory cases posterior tibial nerve stimulation, sacral neuromodulation, and intravesical botulinum toxin injection.  - denies use of tolteradine 2mg  LA nightly - For refractory OAB we reviewed the procedure for intravesical Botox injection with cystoscopy in the office and reviewed the risks, benefits and alternatives of treatment including but not limited to infection, need for self-catheterization and need for repeat therapy.  We discussed that there is a 5-15% chance of needing to catheterize with Botox and that this usually resolves in a few months; however can persist for longer periods of time.  Typically Botox injections would need to be repeated every 3-12 months since this is not a permanent therapy.   We discussed the role of sacral neuromodulation and how it works. It requires a test phase, and documentation of bladder function via diary. After a successful test period, a  permanent wire and generator are placed in the OR. The battery lasts 5 years on average and would need to be replaced surgically.  The goal of this therapy is at least a 50% improvement in symptoms. It is NOT realistic to expect a 100% cure.  We reviewed the fact that about 30% of patients fail the test phase and are not candidates for permanent generator placement.  We discussed the risk of infection and that the patient would not be able to get an MRI once the device is placed. There are two companies that provide this therapy: [employer] and [employer]. [employer]' product is new and is similar to [employer]'s, but has advantages of a smaller and rechargeable battery and being able to have an MRI with the implant. For all procedures, we discussed risks of bleeding, infection, damage to surrounding organs including bowel, bladder, blood vessels, ureters and nerves, need for further surgery, risk of postoperative urinary incontinence or retention with need to catheterize, recurrent prolapse, numbness and weakness at any body site, buttock pain, and the rarer risks of blood clot, heart attack, pneumonia, death.    We also discussed the role of percutaneous tibial nerve stimulation and how it works.  She understands it requires 12 weekly visits for temporary neuromodulation of the sacral nerve roots via the tibial nerve and that she may then require continued tapered treatment.  - if refractory symptoms or return of constipation, pt desires to proceed with PTNS

## 2024-04-02 ENCOUNTER — Other Ambulatory Visit (HOSPITAL_COMMUNITY): Payer: Self-pay

## 2024-04-06 ENCOUNTER — Encounter: Payer: Self-pay | Admitting: Obstetrics

## 2024-04-08 NOTE — Telephone Encounter (Signed)
 Patient requesting more information on procedure options.

## 2024-06-21 ENCOUNTER — Ambulatory Visit: Admitting: Obstetrics
